# Patient Record
Sex: Male | Born: 1935 | Race: White | Hispanic: No | Marital: Married | Smoking: Never smoker
Health system: Southern US, Community
[De-identification: ages and names within clinical notes are randomized; demographics above are authoritative.]

## PROBLEM LIST (undated history)

## (undated) DIAGNOSIS — I4891 Unspecified atrial fibrillation: Secondary | ICD-10-CM

## (undated) DIAGNOSIS — I5022 Chronic systolic (congestive) heart failure: Secondary | ICD-10-CM

## (undated) DIAGNOSIS — K219 Gastro-esophageal reflux disease without esophagitis: Secondary | ICD-10-CM

## (undated) DIAGNOSIS — N400 Enlarged prostate without lower urinary tract symptoms: Secondary | ICD-10-CM

## (undated) DIAGNOSIS — I10 Essential (primary) hypertension: Secondary | ICD-10-CM

## (undated) HISTORY — PX: REPLACEMENT TOTAL KNEE BILATERAL: SUR1225

---

## 1998-12-09 ENCOUNTER — Encounter: Payer: Self-pay | Admitting: Neurosurgery

## 1998-12-09 ENCOUNTER — Ambulatory Visit (HOSPITAL_COMMUNITY): Admission: RE | Admit: 1998-12-09 | Discharge: 1998-12-09 | Payer: Self-pay | Admitting: Neurosurgery

## 1998-12-27 ENCOUNTER — Ambulatory Visit (HOSPITAL_COMMUNITY): Admission: RE | Admit: 1998-12-27 | Discharge: 1998-12-27 | Payer: Self-pay | Admitting: Neurosurgery

## 1998-12-27 ENCOUNTER — Encounter: Payer: Self-pay | Admitting: Neurosurgery

## 1999-01-10 ENCOUNTER — Ambulatory Visit (HOSPITAL_COMMUNITY): Admission: RE | Admit: 1999-01-10 | Discharge: 1999-01-10 | Payer: Self-pay | Admitting: Neurosurgery

## 1999-01-10 ENCOUNTER — Encounter: Payer: Self-pay | Admitting: Neurosurgery

## 1999-01-24 ENCOUNTER — Ambulatory Visit (HOSPITAL_COMMUNITY): Admission: RE | Admit: 1999-01-24 | Discharge: 1999-01-24 | Payer: Self-pay | Admitting: Neurosurgery

## 1999-01-24 ENCOUNTER — Encounter: Payer: Self-pay | Admitting: Neurosurgery

## 2005-04-25 ENCOUNTER — Ambulatory Visit: Payer: Self-pay | Admitting: Gastroenterology

## 2005-08-07 ENCOUNTER — Inpatient Hospital Stay: Payer: Self-pay | Admitting: Orthopaedic Surgery

## 2007-07-13 ENCOUNTER — Emergency Department: Payer: Self-pay | Admitting: Emergency Medicine

## 2007-07-13 ENCOUNTER — Other Ambulatory Visit: Payer: Self-pay

## 2007-07-14 ENCOUNTER — Ambulatory Visit: Payer: Self-pay | Admitting: Emergency Medicine

## 2009-07-11 ENCOUNTER — Ambulatory Visit: Payer: Self-pay | Admitting: Orthopedic Surgery

## 2009-11-22 ENCOUNTER — Ambulatory Visit: Payer: Self-pay | Admitting: Family Medicine

## 2010-02-15 ENCOUNTER — Ambulatory Visit: Payer: Self-pay | Admitting: Family Medicine

## 2011-02-20 ENCOUNTER — Ambulatory Visit: Payer: Self-pay | Admitting: Surgery

## 2011-03-20 ENCOUNTER — Ambulatory Visit: Payer: Self-pay | Admitting: Gastroenterology

## 2011-05-01 ENCOUNTER — Ambulatory Visit: Payer: Self-pay | Admitting: Gastroenterology

## 2011-05-02 LAB — PATHOLOGY REPORT

## 2012-01-01 ENCOUNTER — Ambulatory Visit: Payer: Self-pay | Admitting: Urology

## 2012-04-22 ENCOUNTER — Ambulatory Visit: Payer: Self-pay | Admitting: Vascular Surgery

## 2012-04-22 LAB — CREATININE, SERUM
Creatinine: 0.85 mg/dL (ref 0.60–1.30)
EGFR (African American): >60
EGFR (Non-African Amer.): >60

## 2012-04-22 LAB — BUN: BUN: 20 mg/dL — ABNORMAL HIGH (ref 7–18)

## 2012-04-22 LAB — PROTIME-INR
INR: 1.1
Prothrombin Time: 14.8 secs — ABNORMAL HIGH (ref 11.5–14.7)

## 2012-04-29 ENCOUNTER — Ambulatory Visit: Payer: Self-pay | Admitting: Vascular Surgery

## 2012-05-27 ENCOUNTER — Ambulatory Visit: Payer: Self-pay | Admitting: Vascular Surgery

## 2012-05-27 LAB — BASIC METABOLIC PANEL
Anion Gap: 7 (ref 7–16)
BUN: 15 mg/dL (ref 7–18)
Calcium, Total: 9.3 mg/dL (ref 8.5–10.1)
Co2: 30 mmol/L (ref 21–32)
Creatinine: 0.68 mg/dL (ref 0.60–1.30)
EGFR (African American): >60
EGFR (Non-African Amer.): >60
Glucose: 88 mg/dL (ref 65–99)

## 2012-05-27 LAB — CBC
HCT: 48 % (ref 40.0–52.0)
HGB: 16.5 g/dL (ref 13.0–18.0)
MCH: 35.5 pg — ABNORMAL HIGH (ref 26.0–34.0)
MCHC: 34.3 g/dL (ref 32.0–36.0)
MCV: 103 fL — ABNORMAL HIGH (ref 80–100)
Platelet: 152 10*3/uL (ref 150–440)
RBC: 4.65 10*6/uL (ref 4.40–5.90)
RDW: 14 % (ref 11.5–14.5)
WBC: 6.5 10*3/uL (ref 3.8–10.6)

## 2012-05-28 ENCOUNTER — Inpatient Hospital Stay: Payer: Self-pay | Admitting: Physician Assistant

## 2012-05-28 LAB — PROTIME-INR: INR: 1.1

## 2012-05-29 LAB — CBC WITH DIFFERENTIAL/PLATELET
Basophil %: 0.6 %
Eosinophil %: 1.7 %
HGB: 14 g/dL (ref 13.0–18.0)
MCH: 34.9 pg — ABNORMAL HIGH (ref 26.0–34.0)
MCV: 105 fL — ABNORMAL HIGH (ref 80–100)
Monocyte #: 1.3 x10 3/mm — ABNORMAL HIGH (ref 0.2–1.0)
Monocyte %: 12.7 %
Neutrophil #: 7.1 10*3/uL — ABNORMAL HIGH (ref 1.4–6.5)
Neutrophil %: 68.2 %
RBC: 4.01 10*6/uL — ABNORMAL LOW (ref 4.40–5.90)
WBC: 10.4 10*3/uL (ref 3.8–10.6)

## 2012-05-29 LAB — COMPREHENSIVE METABOLIC PANEL
Alkaline Phosphatase: 78 U/L (ref 50–136)
Anion Gap: 6 — ABNORMAL LOW (ref 7–16)
Bilirubin,Total: 1.2 mg/dL — ABNORMAL HIGH (ref 0.2–1.0)
Calcium, Total: 8.3 mg/dL — ABNORMAL LOW (ref 8.5–10.1)
Co2: 29 mmol/L (ref 21–32)
Creatinine: 0.84 mg/dL (ref 0.60–1.30)
EGFR (African American): >60
EGFR (Non-African Amer.): >60
Glucose: 99 mg/dL (ref 65–99)
Potassium: 3.8 mmol/L (ref 3.5–5.1)
SGOT(AST): 32 U/L (ref 15–37)
SGPT (ALT): 25 U/L (ref 12–78)
Sodium: 137 mmol/L (ref 136–145)
Total Protein: 7 g/dL (ref 6.4–8.2)

## 2012-05-29 LAB — PROTIME-INR
INR: 1.1
Prothrombin Time: 14.4 secs (ref 11.5–14.7)

## 2012-06-25 LAB — PATHOLOGY REPORT

## 2012-08-07 ENCOUNTER — Ambulatory Visit: Payer: Self-pay | Admitting: Physical Medicine and Rehabilitation

## 2013-03-10 DIAGNOSIS — N200 Calculus of kidney: Secondary | ICD-10-CM | POA: Insufficient documentation

## 2013-03-10 DIAGNOSIS — R350 Frequency of micturition: Secondary | ICD-10-CM | POA: Insufficient documentation

## 2013-03-10 DIAGNOSIS — N138 Other obstructive and reflux uropathy: Secondary | ICD-10-CM | POA: Insufficient documentation

## 2013-03-10 DIAGNOSIS — R3911 Hesitancy of micturition: Secondary | ICD-10-CM | POA: Insufficient documentation

## 2013-03-30 ENCOUNTER — Ambulatory Visit: Payer: Self-pay | Admitting: Orthopedic Surgery

## 2013-04-23 ENCOUNTER — Ambulatory Visit: Payer: Self-pay | Admitting: Orthopedic Surgery

## 2013-04-23 LAB — URINALYSIS, COMPLETE
Bacteria: NONE SEEN
Glucose,UR: NEGATIVE mg/dL (ref 0–75)
Ketone: NEGATIVE
Ph: 6 (ref 4.5–8.0)
Specific Gravity: 1.015 (ref 1.003–1.030)
Squamous Epithelial: NONE SEEN
WBC UR: <1 /HPF (ref 0–5)

## 2013-04-23 LAB — PROTIME-INR: INR: 1.7

## 2013-04-23 LAB — BASIC METABOLIC PANEL
BUN: 13 mg/dL (ref 7–18)
Calcium, Total: 9 mg/dL (ref 8.5–10.1)
Chloride: 103 mmol/L (ref 98–107)
EGFR (African American): >60
EGFR (Non-African Amer.): >60
Sodium: 137 mmol/L (ref 136–145)

## 2013-04-23 LAB — CBC
HCT: 44 % (ref 40.0–52.0)
HGB: 15.1 g/dL (ref 13.0–18.0)
MCH: 35.1 pg — ABNORMAL HIGH (ref 26.0–34.0)
MCV: 102 fL — ABNORMAL HIGH (ref 80–100)
RBC: 4.32 10*6/uL — ABNORMAL LOW (ref 4.40–5.90)
WBC: 5.9 10*3/uL (ref 3.8–10.6)

## 2013-04-23 LAB — MRSA PCR SCREENING

## 2013-06-04 ENCOUNTER — Ambulatory Visit: Payer: Self-pay | Admitting: Unknown Physician Specialty

## 2013-06-04 LAB — PROTIME-INR
INR: 2.2
Prothrombin Time: 24 secs — ABNORMAL HIGH (ref 11.5–14.7)

## 2013-06-11 ENCOUNTER — Inpatient Hospital Stay: Payer: Self-pay | Admitting: Orthopedic Surgery

## 2013-06-11 LAB — PROTIME-INR
INR: 1
Prothrombin Time: 13.8 secs (ref 11.5–14.7)

## 2013-06-12 LAB — BASIC METABOLIC PANEL
Anion Gap: 3 — ABNORMAL LOW (ref 7–16)
Calcium, Total: 7.9 mg/dL — ABNORMAL LOW (ref 8.5–10.1)
Creatinine: 0.91 mg/dL (ref 0.60–1.30)
EGFR (African American): >60
EGFR (Non-African Amer.): >60
Glucose: 92 mg/dL (ref 65–99)
Osmolality: 271 (ref 275–301)
Sodium: 136 mmol/L (ref 136–145)

## 2013-06-12 LAB — PLATELET COUNT: Platelet: 115 10*3/uL — ABNORMAL LOW (ref 150–440)

## 2013-06-13 LAB — PROTIME-INR: INR: 1.4

## 2013-06-14 LAB — PROTIME-INR: Prothrombin Time: 16.3 secs — ABNORMAL HIGH (ref 11.5–14.7)

## 2013-12-07 DIAGNOSIS — I482 Chronic atrial fibrillation, unspecified: Secondary | ICD-10-CM | POA: Insufficient documentation

## 2013-12-15 ENCOUNTER — Emergency Department: Payer: Self-pay | Admitting: Emergency Medicine

## 2014-03-20 ENCOUNTER — Inpatient Hospital Stay: Payer: Self-pay | Admitting: Family Medicine

## 2014-03-20 LAB — URINALYSIS, COMPLETE
BACTERIA: NONE SEEN
Bilirubin,UR: NEGATIVE
GLUCOSE, UR: NEGATIVE mg/dL (ref 0–75)
KETONE: NEGATIVE
Leukocyte Esterase: NEGATIVE
Nitrite: NEGATIVE
PH: 7 (ref 4.5–8.0)
Protein: NEGATIVE
RBC,UR: 92 /HPF (ref 0–5)
Specific Gravity: 1.013 (ref 1.003–1.030)
Squamous Epithelial: NONE SEEN
WBC UR: 1 /HPF (ref 0–5)

## 2014-03-20 LAB — COMPREHENSIVE METABOLIC PANEL
ALBUMIN: 3.7 g/dL (ref 3.4–5.0)
AST: 50 U/L — AB (ref 15–37)
Alkaline Phosphatase: 112 U/L
Anion Gap: 9 (ref 7–16)
BILIRUBIN TOTAL: 1.2 mg/dL — AB (ref 0.2–1.0)
BUN: 15 mg/dL (ref 7–18)
CHLORIDE: 100 mmol/L (ref 98–107)
CO2: 29 mmol/L (ref 21–32)
CREATININE: 0.81 mg/dL (ref 0.60–1.30)
Calcium, Total: 9 mg/dL (ref 8.5–10.1)
GLUCOSE: 107 mg/dL — AB (ref 65–99)
OSMOLALITY: 277 (ref 275–301)
POTASSIUM: 4.3 mmol/L (ref 3.5–5.1)
SGPT (ALT): 33 U/L
Sodium: 138 mmol/L (ref 136–145)
Total Protein: 8.6 g/dL — ABNORMAL HIGH (ref 6.4–8.2)

## 2014-03-20 LAB — CBC WITH DIFFERENTIAL/PLATELET
Basophil #: 0 10*3/uL (ref 0.0–0.1)
Basophil %: 0.3 %
EOS ABS: 0.1 10*3/uL (ref 0.0–0.7)
Eosinophil %: 0.9 %
HCT: 48.8 % (ref 40.0–52.0)
HGB: 16 g/dL (ref 13.0–18.0)
Lymphocyte #: 1.3 10*3/uL (ref 1.0–3.6)
Lymphocyte %: 12.7 %
MCH: 34.8 pg — AB (ref 26.0–34.0)
MCHC: 32.8 g/dL (ref 32.0–36.0)
MCV: 106 fL — ABNORMAL HIGH (ref 80–100)
Monocyte #: 0.8 x10 3/mm (ref 0.2–1.0)
Monocyte %: 7.7 %
NEUTROS ABS: 7.7 10*3/uL — AB (ref 1.4–6.5)
Neutrophil %: 78.4 %
Platelet: 117 10*3/uL — ABNORMAL LOW (ref 150–440)
RBC: 4.6 10*6/uL (ref 4.40–5.90)
RDW: 15.2 % — ABNORMAL HIGH (ref 11.5–14.5)
WBC: 9.8 10*3/uL (ref 3.8–10.6)

## 2014-03-20 LAB — PROTIME-INR
INR: 2.3
Prothrombin Time: 24.9 secs — ABNORMAL HIGH (ref 11.5–14.7)

## 2014-03-21 LAB — MAGNESIUM: Magnesium: 1.6 mg/dL — ABNORMAL LOW

## 2014-03-21 LAB — TSH: Thyroid Stimulating Horm: 1.48 u[IU]/mL

## 2014-03-21 LAB — CBC WITH DIFFERENTIAL/PLATELET
BASOS PCT: 0.6 %
Basophil #: 0.1 10*3/uL (ref 0.0–0.1)
EOS PCT: 3.8 %
Eosinophil #: 0.4 10*3/uL (ref 0.0–0.7)
HCT: 44.7 % (ref 40.0–52.0)
HGB: 15 g/dL (ref 13.0–18.0)
LYMPHS ABS: 1.3 10*3/uL (ref 1.0–3.6)
Lymphocyte %: 13 %
MCH: 35.3 pg — ABNORMAL HIGH (ref 26.0–34.0)
MCHC: 33.6 g/dL (ref 32.0–36.0)
MCV: 105 fL — AB (ref 80–100)
MONOS PCT: 7.6 %
Monocyte #: 0.8 x10 3/mm (ref 0.2–1.0)
Neutrophil #: 7.5 10*3/uL — ABNORMAL HIGH (ref 1.4–6.5)
Neutrophil %: 75 %
PLATELETS: 105 10*3/uL — AB (ref 150–440)
RBC: 4.26 10*6/uL — ABNORMAL LOW (ref 4.40–5.90)
RDW: 14.7 % — ABNORMAL HIGH (ref 11.5–14.5)
WBC: 10 10*3/uL (ref 3.8–10.6)

## 2014-03-21 LAB — BASIC METABOLIC PANEL
Anion Gap: 7 (ref 7–16)
BUN: 14 mg/dL (ref 7–18)
CHLORIDE: 102 mmol/L (ref 98–107)
Calcium, Total: 8.3 mg/dL — ABNORMAL LOW (ref 8.5–10.1)
Co2: 28 mmol/L (ref 21–32)
Creatinine: 0.81 mg/dL (ref 0.60–1.30)
EGFR (African American): >60
Glucose: 123 mg/dL — ABNORMAL HIGH (ref 65–99)
OSMOLALITY: 276 (ref 275–301)
Potassium: 4 mmol/L (ref 3.5–5.1)
SODIUM: 137 mmol/L (ref 136–145)

## 2014-03-21 LAB — LIPID PANEL
CHOLESTEROL: 88 mg/dL (ref 0–200)
HDL Cholesterol: 29 mg/dL — ABNORMAL LOW (ref 40–60)
Ldl Cholesterol, Calc: 45 mg/dL (ref 0–100)
Triglycerides: 72 mg/dL (ref 0–200)
VLDL CHOLESTEROL, CALC: 14 mg/dL (ref 5–40)

## 2014-03-21 LAB — PROTIME-INR
INR: 1.8
INR: 1.4
Prothrombin Time: 20.6 secs — ABNORMAL HIGH (ref 11.5–14.7)
Prothrombin Time: 16.8 secs — ABNORMAL HIGH (ref 11.5–14.7)

## 2014-03-21 LAB — HEMOGLOBIN A1C: HEMOGLOBIN A1C: 5.8 % (ref 4.2–6.3)

## 2014-03-22 LAB — BASIC METABOLIC PANEL
ANION GAP: 10 (ref 7–16)
BUN: 13 mg/dL (ref 7–18)
CO2: 26 mmol/L (ref 21–32)
Calcium, Total: 8 mg/dL — ABNORMAL LOW (ref 8.5–10.1)
Chloride: 96 mmol/L — ABNORMAL LOW (ref 98–107)
Creatinine: 0.91 mg/dL (ref 0.60–1.30)
EGFR (African American): >60
Glucose: 107 mg/dL — ABNORMAL HIGH (ref 65–99)
OSMOLALITY: 265 (ref 275–301)
Potassium: 3.8 mmol/L (ref 3.5–5.1)
Sodium: 132 mmol/L — ABNORMAL LOW (ref 136–145)

## 2014-03-22 LAB — CBC WITH DIFFERENTIAL/PLATELET
BASOS ABS: 0 10*3/uL (ref 0.0–0.1)
Basophil %: 0.3 %
Eosinophil #: 0.3 10*3/uL (ref 0.0–0.7)
Eosinophil %: 3.5 %
HCT: 41.6 % (ref 40.0–52.0)
HGB: 14.1 g/dL (ref 13.0–18.0)
Lymphocyte #: 1.2 10*3/uL (ref 1.0–3.6)
Lymphocyte %: 12.9 %
MCH: 35.2 pg — AB (ref 26.0–34.0)
MCHC: 33.8 g/dL (ref 32.0–36.0)
MCV: 104 fL — ABNORMAL HIGH (ref 80–100)
Monocyte #: 0.8 x10 3/mm (ref 0.2–1.0)
Monocyte %: 9 %
NEUTROS ABS: 6.7 10*3/uL — AB (ref 1.4–6.5)
NEUTROS PCT: 74.3 %
PLATELETS: 83 10*3/uL — AB (ref 150–440)
RBC: 3.99 10*6/uL — ABNORMAL LOW (ref 4.40–5.90)
RDW: 15.1 % — AB (ref 11.5–14.5)
WBC: 9 10*3/uL (ref 3.8–10.6)

## 2014-03-23 LAB — CBC WITH DIFFERENTIAL/PLATELET
Basophil #: 0.1 10*3/uL (ref 0.0–0.1)
Basophil %: 0.7 %
EOS ABS: 0.7 10*3/uL (ref 0.0–0.7)
Eosinophil %: 6.8 %
HCT: 42 % (ref 40.0–52.0)
HGB: 14.1 g/dL (ref 13.0–18.0)
LYMPHS ABS: 1.8 10*3/uL (ref 1.0–3.6)
LYMPHS PCT: 18.5 %
MCH: 35.5 pg — ABNORMAL HIGH (ref 26.0–34.0)
MCHC: 33.7 g/dL (ref 32.0–36.0)
MCV: 105 fL — AB (ref 80–100)
Monocyte #: 1.3 x10 3/mm — ABNORMAL HIGH (ref 0.2–1.0)
Monocyte %: 13.8 %
Neutrophil #: 5.9 10*3/uL (ref 1.4–6.5)
Neutrophil %: 60.2 %
Platelet: 86 10*3/uL — ABNORMAL LOW (ref 150–440)
RBC: 3.98 10*6/uL — ABNORMAL LOW (ref 4.40–5.90)
RDW: 15.2 % — ABNORMAL HIGH (ref 11.5–14.5)
WBC: 9.8 10*3/uL (ref 3.8–10.6)

## 2014-03-23 LAB — BASIC METABOLIC PANEL
Anion Gap: 10 (ref 7–16)
BUN: 16 mg/dL (ref 7–18)
CALCIUM: 8.1 mg/dL — AB (ref 8.5–10.1)
CHLORIDE: 95 mmol/L — AB (ref 98–107)
CO2: 29 mmol/L (ref 21–32)
Creatinine: 0.99 mg/dL (ref 0.60–1.30)
EGFR (Non-African Amer.): >60
GLUCOSE: 88 mg/dL (ref 65–99)
Osmolality: 269 (ref 275–301)
POTASSIUM: 3.5 mmol/L (ref 3.5–5.1)
Sodium: 134 mmol/L — ABNORMAL LOW (ref 136–145)

## 2014-03-23 LAB — PROTIME-INR
INR: 1.4
PROTHROMBIN TIME: 16.5 s — AB (ref 11.5–14.7)

## 2014-03-24 ENCOUNTER — Encounter: Payer: Self-pay | Admitting: Internal Medicine

## 2014-03-24 LAB — BASIC METABOLIC PANEL
ANION GAP: 7 (ref 7–16)
BUN: 18 mg/dL (ref 7–18)
CALCIUM: 8.2 mg/dL — AB (ref 8.5–10.1)
CHLORIDE: 96 mmol/L — AB (ref 98–107)
Co2: 30 mmol/L (ref 21–32)
Creatinine: 0.92 mg/dL (ref 0.60–1.30)
GLUCOSE: 99 mg/dL (ref 65–99)
Osmolality: 268 (ref 275–301)
POTASSIUM: 3.5 mmol/L (ref 3.5–5.1)
SODIUM: 133 mmol/L — AB (ref 136–145)

## 2014-03-24 LAB — CBC WITH DIFFERENTIAL/PLATELET
BASOS PCT: 0.4 %
Basophil #: 0 10*3/uL (ref 0.0–0.1)
Eosinophil #: 0.6 10*3/uL (ref 0.0–0.7)
Eosinophil %: 6.3 %
HCT: 41.4 % (ref 40.0–52.0)
HGB: 13.7 g/dL (ref 13.0–18.0)
LYMPHS ABS: 1.4 10*3/uL (ref 1.0–3.6)
Lymphocyte %: 15.4 %
MCH: 35 pg — AB (ref 26.0–34.0)
MCHC: 33.1 g/dL (ref 32.0–36.0)
MCV: 106 fL — ABNORMAL HIGH (ref 80–100)
Monocyte #: 1.3 x10 3/mm — ABNORMAL HIGH (ref 0.2–1.0)
Monocyte %: 14.1 %
Neutrophil #: 5.8 10*3/uL (ref 1.4–6.5)
Neutrophil %: 63.8 %
Platelet: 105 10*3/uL — ABNORMAL LOW (ref 150–440)
RBC: 3.91 10*6/uL — ABNORMAL LOW (ref 4.40–5.90)
RDW: 15.1 % — AB (ref 11.5–14.5)
WBC: 9 10*3/uL (ref 3.8–10.6)

## 2014-03-24 LAB — PROTIME-INR
INR: 1.5
PROTHROMBIN TIME: 18 s — AB (ref 11.5–14.7)

## 2014-03-25 ENCOUNTER — Ambulatory Visit: Payer: Self-pay | Admitting: Gerontology

## 2014-03-25 LAB — PROTIME-INR
INR: 1.5
PROTHROMBIN TIME: 17.9 s — AB (ref 11.5–14.7)

## 2014-03-27 LAB — URINALYSIS, COMPLETE
BACTERIA: NONE SEEN
Bilirubin,UR: NEGATIVE
Glucose,UR: NEGATIVE mg/dL (ref 0–75)
KETONE: NEGATIVE
Leukocyte Esterase: NEGATIVE
Nitrite: NEGATIVE
Ph: 6 (ref 4.5–8.0)
Protein: NEGATIVE
SPECIFIC GRAVITY: 1.018 (ref 1.003–1.030)
Squamous Epithelial: <1
WBC UR: 3 /HPF (ref 0–5)

## 2014-03-27 LAB — PROTIME-INR
INR: 1.8
PROTHROMBIN TIME: 20.1 s — AB (ref 11.5–14.7)

## 2014-03-30 LAB — PROTIME-INR
INR: 2
PROTHROMBIN TIME: 22.3 s — AB (ref 11.5–14.7)

## 2014-03-30 LAB — URINE CULTURE

## 2014-04-02 LAB — PROTIME-INR
INR: 3.4
Prothrombin Time: 33.5 secs — ABNORMAL HIGH (ref 11.5–14.7)

## 2014-04-04 LAB — PROTIME-INR
INR: 2.7
Prothrombin Time: 28.3 secs — ABNORMAL HIGH (ref 11.5–14.7)

## 2014-04-06 LAB — PROTIME-INR
INR: 1.9
PROTHROMBIN TIME: 21.5 s — AB (ref 11.5–14.7)

## 2014-04-13 ENCOUNTER — Encounter: Payer: Self-pay | Admitting: Internal Medicine

## 2014-04-13 LAB — PROTIME-INR
INR: 2.8
Prothrombin Time: 28.4 secs — ABNORMAL HIGH (ref 11.5–14.7)

## 2014-04-20 LAB — PROTIME-INR
INR: 2.5
Prothrombin Time: 26.5 secs — ABNORMAL HIGH (ref 11.5–14.7)

## 2014-04-27 LAB — PROTIME-INR
INR: 2.4
PROTHROMBIN TIME: 25.6 s — AB (ref 11.5–14.7)

## 2014-05-04 LAB — PROTIME-INR
INR: 2.4
PROTHROMBIN TIME: 25.8 s — AB (ref 11.5–14.7)

## 2014-05-11 LAB — PROTIME-INR
INR: 2.1
PROTHROMBIN TIME: 22.7 s — AB (ref 11.5–14.7)

## 2014-05-13 ENCOUNTER — Encounter: Payer: Self-pay | Admitting: Internal Medicine

## 2014-05-18 LAB — PROTIME-INR
INR: 1.3
PROTHROMBIN TIME: 16.3 s — AB (ref 11.5–14.7)

## 2014-05-20 LAB — PROTIME-INR
INR: 1.6
PROTHROMBIN TIME: 18.7 s — AB (ref 11.5–14.7)

## 2014-06-13 ENCOUNTER — Encounter: Payer: Self-pay | Admitting: Internal Medicine

## 2014-06-26 ENCOUNTER — Inpatient Hospital Stay: Payer: Self-pay | Admitting: Family Medicine

## 2014-06-26 LAB — MAGNESIUM: Magnesium: 1.3 mg/dL — ABNORMAL LOW

## 2014-06-26 LAB — CBC WITH DIFFERENTIAL/PLATELET
Basophil #: 0 10*3/uL (ref 0.0–0.1)
Basophil %: 0.4 %
Eosinophil #: 0.1 10*3/uL (ref 0.0–0.7)
Eosinophil %: 0.5 %
HCT: 43.6 % (ref 40.0–52.0)
HGB: 14.8 g/dL (ref 13.0–18.0)
Lymphocyte #: 1 10*3/uL (ref 1.0–3.6)
Lymphocyte %: 8 %
MCH: 34.9 pg — ABNORMAL HIGH (ref 26.0–34.0)
MCHC: 33.9 g/dL (ref 32.0–36.0)
MCV: 103 fL — ABNORMAL HIGH (ref 80–100)
MONO ABS: 0.4 x10 3/mm (ref 0.2–1.0)
Monocyte %: 3.3 %
NEUTROS ABS: 10.8 10*3/uL — AB (ref 1.4–6.5)
NEUTROS PCT: 87.8 %
Platelet: 127 10*3/uL — ABNORMAL LOW (ref 150–440)
RBC: 4.24 10*6/uL — AB (ref 4.40–5.90)
RDW: 14.2 % (ref 11.5–14.5)
WBC: 12.3 10*3/uL — AB (ref 3.8–10.6)

## 2014-06-26 LAB — URINALYSIS, COMPLETE
Bacteria: NONE SEEN
Bilirubin,UR: NEGATIVE
Glucose,UR: NEGATIVE mg/dL (ref 0–75)
Ketone: NEGATIVE
Leukocyte Esterase: NEGATIVE
Nitrite: NEGATIVE
PH: 9 (ref 4.5–8.0)
Protein: NEGATIVE
RBC,UR: 794 /HPF (ref 0–5)
Specific Gravity: 1.011 (ref 1.003–1.030)
Squamous Epithelial: NONE SEEN

## 2014-06-26 LAB — COMPREHENSIVE METABOLIC PANEL
ALK PHOS: 100 U/L
ALT: 24 U/L
Albumin: 3.3 g/dL — ABNORMAL LOW (ref 3.4–5.0)
Anion Gap: 9 (ref 7–16)
BUN: 11 mg/dL (ref 7–18)
Bilirubin,Total: 1 mg/dL (ref 0.2–1.0)
CO2: 26 mmol/L (ref 21–32)
Calcium, Total: 8.2 mg/dL — ABNORMAL LOW (ref 8.5–10.1)
Chloride: 103 mmol/L (ref 98–107)
Creatinine: 0.87 mg/dL (ref 0.60–1.30)
Glucose: 107 mg/dL — ABNORMAL HIGH (ref 65–99)
OSMOLALITY: 276 (ref 275–301)
Potassium: 4 mmol/L (ref 3.5–5.1)
SGOT(AST): 28 U/L (ref 15–37)
Sodium: 138 mmol/L (ref 136–145)
TOTAL PROTEIN: 7.7 g/dL (ref 6.4–8.2)

## 2014-06-26 LAB — PROTIME-INR
INR: 1.3
PROTHROMBIN TIME: 16.4 s — AB (ref 11.5–14.7)

## 2014-06-26 LAB — TROPONIN I: Troponin-I: 0.05 ng/mL

## 2014-06-26 LAB — RAPID INFLUENZA A&B ANTIGENS

## 2014-06-26 LAB — PHOSPHORUS: PHOSPHORUS: 1.7 mg/dL — AB (ref 2.5–4.9)

## 2014-06-27 LAB — CBC WITH DIFFERENTIAL/PLATELET
BASOS ABS: 0.1 10*3/uL (ref 0.0–0.1)
BASOS PCT: 0.3 %
EOS PCT: 0 %
Eosinophil #: 0 10*3/uL (ref 0.0–0.7)
HCT: 43.1 % (ref 40.0–52.0)
HGB: 13.5 g/dL (ref 13.0–18.0)
LYMPHS PCT: 6.3 %
Lymphocyte #: 1.7 10*3/uL (ref 1.0–3.6)
MCH: 33.9 pg (ref 26.0–34.0)
MCHC: 31.4 g/dL — ABNORMAL LOW (ref 32.0–36.0)
MCV: 108 fL — ABNORMAL HIGH (ref 80–100)
MONO ABS: 1.8 x10 3/mm — AB (ref 0.2–1.0)
Monocyte %: 6.6 %
NEUTROS PCT: 86.8 %
Neutrophil #: 23.2 10*3/uL — ABNORMAL HIGH (ref 1.4–6.5)
Platelet: 98 10*3/uL — ABNORMAL LOW (ref 150–440)
RBC: 4 10*6/uL — AB (ref 4.40–5.90)
RDW: 14.7 % — AB (ref 11.5–14.5)
WBC: 26.7 10*3/uL — AB (ref 3.8–10.6)

## 2014-06-27 LAB — COMPREHENSIVE METABOLIC PANEL
ALT: 27 U/L
Albumin: 2.5 g/dL — ABNORMAL LOW (ref 3.4–5.0)
Alkaline Phosphatase: 84 U/L
Anion Gap: 14 (ref 7–16)
BUN: 14 mg/dL (ref 7–18)
Bilirubin,Total: 1.5 mg/dL — ABNORMAL HIGH (ref 0.2–1.0)
CALCIUM: 7.5 mg/dL — AB (ref 8.5–10.1)
Chloride: 108 mmol/L — ABNORMAL HIGH (ref 98–107)
Co2: 20 mmol/L — ABNORMAL LOW (ref 21–32)
Creatinine: 1.59 mg/dL — ABNORMAL HIGH (ref 0.60–1.30)
GFR CALC AF AMER: 55 — AB
GFR CALC NON AF AMER: 45 — AB
Glucose: 96 mg/dL (ref 65–99)
Osmolality: 283 (ref 275–301)
Potassium: 3.5 mmol/L (ref 3.5–5.1)
SGOT(AST): 41 U/L — ABNORMAL HIGH (ref 15–37)
Sodium: 142 mmol/L (ref 136–145)
Total Protein: 6.1 g/dL — ABNORMAL LOW (ref 6.4–8.2)

## 2014-06-27 LAB — PHOSPHORUS: PHOSPHORUS: 2.5 mg/dL (ref 2.5–4.9)

## 2014-06-27 LAB — MAGNESIUM
MAGNESIUM: 2.1 mg/dL
Magnesium: 1.7 mg/dL — ABNORMAL LOW

## 2014-06-28 LAB — URINE CULTURE

## 2014-06-28 LAB — VANCOMYCIN, TROUGH: Vancomycin, Trough: 23 ug/mL (ref 10–20)

## 2014-06-29 LAB — CBC WITH DIFFERENTIAL/PLATELET
Basophil #: 0.1 10*3/uL (ref 0.0–0.1)
Basophil %: 0.3 %
Eosinophil #: 0.2 10*3/uL (ref 0.0–0.7)
Eosinophil %: 0.8 %
HCT: 38.3 % — ABNORMAL LOW (ref 40.0–52.0)
HGB: 12.7 g/dL — AB (ref 13.0–18.0)
Lymphocyte #: 1.7 10*3/uL (ref 1.0–3.6)
Lymphocyte %: 9.2 %
MCH: 34.5 pg — AB (ref 26.0–34.0)
MCHC: 33.1 g/dL (ref 32.0–36.0)
MCV: 104 fL — AB (ref 80–100)
Monocyte #: 1.1 x10 3/mm — ABNORMAL HIGH (ref 0.2–1.0)
Monocyte %: 6.2 %
Neutrophil #: 15.3 10*3/uL — ABNORMAL HIGH (ref 1.4–6.5)
Neutrophil %: 83.5 %
Platelet: 99 10*3/uL — ABNORMAL LOW (ref 150–440)
RBC: 3.68 10*6/uL — ABNORMAL LOW (ref 4.40–5.90)
RDW: 14.6 % — AB (ref 11.5–14.5)
WBC: 18.4 10*3/uL — AB (ref 3.8–10.6)

## 2014-06-29 LAB — BASIC METABOLIC PANEL
Anion Gap: 7 (ref 7–16)
BUN: 18 mg/dL (ref 7–18)
CHLORIDE: 106 mmol/L (ref 98–107)
CO2: 26 mmol/L (ref 21–32)
Calcium, Total: 7.4 mg/dL — ABNORMAL LOW (ref 8.5–10.1)
Creatinine: 1.32 mg/dL — ABNORMAL HIGH (ref 0.60–1.30)
GFR CALC NON AF AMER: 56 — AB
Glucose: 116 mg/dL — ABNORMAL HIGH (ref 65–99)
Osmolality: 280 (ref 275–301)
Potassium: 3.4 mmol/L — ABNORMAL LOW (ref 3.5–5.1)
SODIUM: 139 mmol/L (ref 136–145)

## 2014-06-30 LAB — CBC WITH DIFFERENTIAL/PLATELET
BASOS ABS: 0.1 10*3/uL (ref 0.0–0.1)
Basophil %: 0.5 %
EOS ABS: 0.2 10*3/uL (ref 0.0–0.7)
Eosinophil %: 1.8 %
HCT: 37.1 % — ABNORMAL LOW (ref 40.0–52.0)
HGB: 12.2 g/dL — AB (ref 13.0–18.0)
Lymphocyte #: 1.6 10*3/uL (ref 1.0–3.6)
Lymphocyte %: 14 %
MCH: 34.4 pg — AB (ref 26.0–34.0)
MCHC: 33 g/dL (ref 32.0–36.0)
MCV: 104 fL — ABNORMAL HIGH (ref 80–100)
MONOS PCT: 8.8 %
Monocyte #: 1 x10 3/mm (ref 0.2–1.0)
NEUTROS ABS: 8.8 10*3/uL — AB (ref 1.4–6.5)
Neutrophil %: 74.9 %
Platelet: 94 10*3/uL — ABNORMAL LOW (ref 150–440)
RBC: 3.55 10*6/uL — ABNORMAL LOW (ref 4.40–5.90)
RDW: 14.5 % (ref 11.5–14.5)
WBC: 11.7 10*3/uL — ABNORMAL HIGH (ref 3.8–10.6)

## 2014-06-30 LAB — BASIC METABOLIC PANEL
Anion Gap: 5 — ABNORMAL LOW (ref 7–16)
BUN: 16 mg/dL (ref 7–18)
CREATININE: 1.15 mg/dL (ref 0.60–1.30)
Calcium, Total: 8 mg/dL — ABNORMAL LOW (ref 8.5–10.1)
Chloride: 107 mmol/L (ref 98–107)
Co2: 26 mmol/L (ref 21–32)
Glucose: 88 mg/dL (ref 65–99)
Osmolality: 276 (ref 275–301)
POTASSIUM: 3.7 mmol/L (ref 3.5–5.1)
SODIUM: 138 mmol/L (ref 136–145)

## 2014-07-01 LAB — CBC WITH DIFFERENTIAL/PLATELET
BASOS ABS: 0.1 10*3/uL (ref 0.0–0.1)
Basophil %: 0.5 %
Eosinophil #: 0.2 10*3/uL (ref 0.0–0.7)
Eosinophil %: 1.9 %
HCT: 37.8 % — AB (ref 40.0–52.0)
HGB: 12.5 g/dL — AB (ref 13.0–18.0)
Lymphocyte #: 1.8 10*3/uL (ref 1.0–3.6)
Lymphocyte %: 15.9 %
MCH: 34.3 pg — AB (ref 26.0–34.0)
MCHC: 33.1 g/dL (ref 32.0–36.0)
MCV: 104 fL — ABNORMAL HIGH (ref 80–100)
Monocyte #: 1.4 x10 3/mm — ABNORMAL HIGH (ref 0.2–1.0)
Monocyte %: 11.9 %
NEUTROS PCT: 69.8 %
Neutrophil #: 8 10*3/uL — ABNORMAL HIGH (ref 1.4–6.5)
PLATELETS: 111 10*3/uL — AB (ref 150–440)
RBC: 3.65 10*6/uL — AB (ref 4.40–5.90)
RDW: 14.3 % (ref 11.5–14.5)
WBC: 11.4 10*3/uL — AB (ref 3.8–10.6)

## 2014-07-01 LAB — COMPREHENSIVE METABOLIC PANEL
ALBUMIN: 2.3 g/dL — AB (ref 3.4–5.0)
ALK PHOS: 133 U/L — AB
ALT: 35 U/L
Anion Gap: 5 — ABNORMAL LOW (ref 7–16)
BILIRUBIN TOTAL: 1.2 mg/dL — AB (ref 0.2–1.0)
BUN: 16 mg/dL (ref 7–18)
CALCIUM: 7.6 mg/dL — AB (ref 8.5–10.1)
CREATININE: 1.06 mg/dL (ref 0.60–1.30)
Chloride: 111 mmol/L — ABNORMAL HIGH (ref 98–107)
Co2: 27 mmol/L (ref 21–32)
GLUCOSE: 85 mg/dL (ref 65–99)
Osmolality: 285 (ref 275–301)
Potassium: 3.6 mmol/L (ref 3.5–5.1)
SGOT(AST): 35 U/L (ref 15–37)
Sodium: 143 mmol/L (ref 136–145)
Total Protein: 6.2 g/dL — ABNORMAL LOW (ref 6.4–8.2)

## 2014-07-01 LAB — CULTURE, BLOOD (SINGLE)

## 2014-07-02 ENCOUNTER — Emergency Department: Payer: Self-pay | Admitting: Emergency Medicine

## 2014-07-02 LAB — CBC
HCT: 39.6 % — ABNORMAL LOW (ref 40.0–52.0)
HGB: 13.1 g/dL (ref 13.0–18.0)
MCH: 35 pg — ABNORMAL HIGH (ref 26.0–34.0)
MCHC: 33.2 g/dL (ref 32.0–36.0)
MCV: 105 fL — ABNORMAL HIGH (ref 80–100)
PLATELETS: 145 10*3/uL — AB (ref 150–440)
RBC: 3.76 10*6/uL — ABNORMAL LOW (ref 4.40–5.90)
RDW: 14.3 % (ref 11.5–14.5)
WBC: 9.6 10*3/uL (ref 3.8–10.6)

## 2014-07-02 LAB — COMPREHENSIVE METABOLIC PANEL
ALBUMIN: 2.4 g/dL — AB (ref 3.4–5.0)
ALT: 31 U/L
ANION GAP: 4 — AB (ref 7–16)
Alkaline Phosphatase: 149 U/L — ABNORMAL HIGH
BILIRUBIN TOTAL: 0.8 mg/dL (ref 0.2–1.0)
BUN: 18 mg/dL (ref 7–18)
CO2: 28 mmol/L (ref 21–32)
CREATININE: 1.04 mg/dL (ref 0.60–1.30)
Calcium, Total: 8.5 mg/dL (ref 8.5–10.1)
Chloride: 112 mmol/L — ABNORMAL HIGH (ref 98–107)
Glucose: 93 mg/dL (ref 65–99)
Potassium: 4.2 mmol/L (ref 3.5–5.1)
SGOT(AST): 39 U/L — ABNORMAL HIGH (ref 15–37)
SODIUM: 144 mmol/L (ref 136–145)
TOTAL PROTEIN: 6.5 g/dL (ref 6.4–8.2)

## 2014-07-02 LAB — PROTIME-INR
INR: 2.2
PROTHROMBIN TIME: 23.5 s — AB (ref 11.5–14.7)

## 2014-07-02 LAB — PRO B NATRIURETIC PEPTIDE: B-Type Natriuretic Peptide: 3218 pg/mL — ABNORMAL HIGH (ref 0–450)

## 2014-07-02 LAB — TROPONIN I: TROPONIN-I: 0.03 ng/mL

## 2014-07-07 DIAGNOSIS — I5023 Acute on chronic systolic (congestive) heart failure: Secondary | ICD-10-CM | POA: Insufficient documentation

## 2014-07-13 ENCOUNTER — Encounter: Payer: Self-pay | Admitting: Internal Medicine

## 2014-07-27 LAB — COMPREHENSIVE METABOLIC PANEL
ALK PHOS: 113 U/L
ANION GAP: 8 (ref 7–16)
AST: 31 U/L (ref 15–37)
Albumin: 3.1 g/dL — ABNORMAL LOW (ref 3.4–5.0)
BILIRUBIN TOTAL: 1.1 mg/dL — AB (ref 0.2–1.0)
BUN: 13 mg/dL (ref 7–18)
CALCIUM: 8.6 mg/dL (ref 8.5–10.1)
CHLORIDE: 105 mmol/L (ref 98–107)
CO2: 25 mmol/L (ref 21–32)
Creatinine: 1.05 mg/dL (ref 0.60–1.30)
EGFR (African American): >60
EGFR (Non-African Amer.): >60
Glucose: 104 mg/dL — ABNORMAL HIGH (ref 65–99)
Osmolality: 276 (ref 275–301)
POTASSIUM: 3.1 mmol/L — AB (ref 3.5–5.1)
SGPT (ALT): 16 U/L
Sodium: 138 mmol/L (ref 136–145)
TOTAL PROTEIN: 8.4 g/dL — AB (ref 6.4–8.2)

## 2014-07-27 LAB — URINALYSIS, COMPLETE
BILIRUBIN, UR: NEGATIVE
GLUCOSE, UR: NEGATIVE mg/dL (ref 0–75)
Ketone: NEGATIVE
Nitrite: NEGATIVE
PH: 5 (ref 4.5–8.0)
Protein: NEGATIVE
RBC,UR: 10 /HPF (ref 0–5)
Specific Gravity: 1.009 (ref 1.003–1.030)
Squamous Epithelial: 1
WBC UR: 62 /HPF (ref 0–5)

## 2014-07-27 LAB — CBC WITH DIFFERENTIAL/PLATELET
BASOS ABS: 0.1 10*3/uL (ref 0.0–0.1)
BASOS PCT: 0.5 %
Eosinophil #: 0.1 10*3/uL (ref 0.0–0.7)
Eosinophil %: 1 %
HCT: 43.8 % (ref 40.0–52.0)
HGB: 14.3 g/dL (ref 13.0–18.0)
LYMPHS PCT: 5.7 %
Lymphocyte #: 0.8 10*3/uL — ABNORMAL LOW (ref 1.0–3.6)
MCH: 33.5 pg (ref 26.0–34.0)
MCHC: 32.7 g/dL (ref 32.0–36.0)
MCV: 103 fL — AB (ref 80–100)
MONO ABS: 0.1 x10 3/mm — AB (ref 0.2–1.0)
Monocyte %: 1 %
NEUTROS ABS: 12.3 10*3/uL — AB (ref 1.4–6.5)
NEUTROS PCT: 91.8 %
Platelet: 153 10*3/uL (ref 150–440)
RBC: 4.27 10*6/uL — ABNORMAL LOW (ref 4.40–5.90)
RDW: 14.2 % (ref 11.5–14.5)
WBC: 13.4 10*3/uL — AB (ref 3.8–10.6)

## 2014-07-27 LAB — LIPASE, BLOOD: LIPASE: 89 U/L (ref 73–393)

## 2014-07-28 ENCOUNTER — Inpatient Hospital Stay: Payer: Self-pay | Admitting: Family Medicine

## 2014-07-28 LAB — CLOSTRIDIUM DIFFICILE(ARMC)

## 2014-07-29 LAB — CBC WITH DIFFERENTIAL/PLATELET
BASOS ABS: 0.1 10*3/uL (ref 0.0–0.1)
Basophil %: 0.8 %
EOS ABS: 0.2 10*3/uL (ref 0.0–0.7)
Eosinophil %: 1.8 %
HCT: 36.8 % — ABNORMAL LOW (ref 40.0–52.0)
HGB: 12.1 g/dL — ABNORMAL LOW (ref 13.0–18.0)
Lymphocyte #: 2 10*3/uL (ref 1.0–3.6)
Lymphocyte %: 17.1 %
MCH: 33.6 pg (ref 26.0–34.0)
MCHC: 32.9 g/dL (ref 32.0–36.0)
MCV: 102 fL — ABNORMAL HIGH (ref 80–100)
MONO ABS: 1.5 x10 3/mm — AB (ref 0.2–1.0)
Monocyte %: 13.3 %
Neutrophil #: 7.8 10*3/uL — ABNORMAL HIGH (ref 1.4–6.5)
Neutrophil %: 67 %
Platelet: 131 10*3/uL — ABNORMAL LOW (ref 150–440)
RBC: 3.6 10*6/uL — AB (ref 4.40–5.90)
RDW: 14.3 % (ref 11.5–14.5)
WBC: 11.6 10*3/uL — ABNORMAL HIGH (ref 3.8–10.6)

## 2014-07-29 LAB — BASIC METABOLIC PANEL
Anion Gap: 1 — ABNORMAL LOW (ref 7–16)
BUN: 13 mg/dL (ref 7–18)
CHLORIDE: 107 mmol/L (ref 98–107)
CREATININE: 0.9 mg/dL (ref 0.60–1.30)
Calcium, Total: 8 mg/dL — ABNORMAL LOW (ref 8.5–10.1)
Co2: 26 mmol/L (ref 21–32)
EGFR (African American): >60
EGFR (Non-African Amer.): >60
Glucose: 124 mg/dL — ABNORMAL HIGH (ref 65–99)
Osmolality: 270 (ref 275–301)
Potassium: 3.1 mmol/L — ABNORMAL LOW (ref 3.5–5.1)
SODIUM: 134 mmol/L — AB (ref 136–145)

## 2014-07-30 LAB — CBC WITH DIFFERENTIAL/PLATELET
BASOS ABS: 0.1 10*3/uL (ref 0.0–0.1)
BASOS PCT: 0.8 %
Eosinophil #: 0.2 10*3/uL (ref 0.0–0.7)
Eosinophil %: 2.3 %
HCT: 36.6 % — ABNORMAL LOW (ref 40.0–52.0)
HGB: 11.9 g/dL — ABNORMAL LOW (ref 13.0–18.0)
LYMPHS PCT: 17.2 %
Lymphocyte #: 1.7 10*3/uL (ref 1.0–3.6)
MCH: 33.7 pg (ref 26.0–34.0)
MCHC: 32.6 g/dL (ref 32.0–36.0)
MCV: 104 fL — AB (ref 80–100)
Monocyte #: 1.1 x10 3/mm — ABNORMAL HIGH (ref 0.2–1.0)
Monocyte %: 10.9 %
NEUTROS PCT: 68.8 %
Neutrophil #: 6.8 10*3/uL — ABNORMAL HIGH (ref 1.4–6.5)
Platelet: 138 10*3/uL — ABNORMAL LOW (ref 150–440)
RBC: 3.54 10*6/uL — ABNORMAL LOW (ref 4.40–5.90)
RDW: 14.6 % — ABNORMAL HIGH (ref 11.5–14.5)
WBC: 9.9 10*3/uL (ref 3.8–10.6)

## 2014-07-30 LAB — BASIC METABOLIC PANEL
ANION GAP: 9 (ref 7–16)
BUN: 13 mg/dL (ref 7–18)
CHLORIDE: 106 mmol/L (ref 98–107)
CREATININE: 0.95 mg/dL (ref 0.60–1.30)
Calcium, Total: 8.2 mg/dL — ABNORMAL LOW (ref 8.5–10.1)
Co2: 25 mmol/L (ref 21–32)
EGFR (African American): >60
Glucose: 94 mg/dL (ref 65–99)
Osmolality: 279 (ref 275–301)
Potassium: 3.6 mmol/L (ref 3.5–5.1)
Sodium: 140 mmol/L (ref 136–145)

## 2014-07-30 LAB — CK TOTAL AND CKMB (NOT AT ARMC)
CK, Total: 32 U/L — ABNORMAL LOW (ref 39–308)
CK-MB: 0.8 ng/mL (ref 0.5–3.6)

## 2014-07-30 LAB — TROPONIN I: TROPONIN-I: 0.02 ng/mL

## 2014-07-31 LAB — CBC WITH DIFFERENTIAL/PLATELET
Basophil #: 0.1 10*3/uL (ref 0.0–0.1)
Basophil %: 1.1 %
Eosinophil #: 0.4 10*3/uL (ref 0.0–0.7)
Eosinophil %: 4.5 %
HCT: 37.2 % — ABNORMAL LOW (ref 40.0–52.0)
HGB: 12.4 g/dL — ABNORMAL LOW (ref 13.0–18.0)
Lymphocyte #: 2.3 10*3/uL (ref 1.0–3.6)
Lymphocyte %: 24.7 %
MCH: 33.9 pg (ref 26.0–34.0)
MCHC: 33.3 g/dL (ref 32.0–36.0)
MCV: 102 fL — ABNORMAL HIGH (ref 80–100)
Monocyte #: 0.9 x10 3/mm (ref 0.2–1.0)
Monocyte %: 10.3 %
Neutrophil #: 5.5 10*3/uL (ref 1.4–6.5)
Neutrophil %: 59.4 %
PLATELETS: 153 10*3/uL (ref 150–440)
RBC: 3.65 10*6/uL — ABNORMAL LOW (ref 4.40–5.90)
RDW: 14.6 % — ABNORMAL HIGH (ref 11.5–14.5)
WBC: 9.2 10*3/uL (ref 3.8–10.6)

## 2014-07-31 LAB — COMPREHENSIVE METABOLIC PANEL
ALBUMIN: 2.4 g/dL — AB (ref 3.4–5.0)
AST: 23 U/L (ref 15–37)
Alkaline Phosphatase: 85 U/L
Anion Gap: 8 (ref 7–16)
BUN: 12 mg/dL (ref 7–18)
Bilirubin,Total: 0.6 mg/dL (ref 0.2–1.0)
CALCIUM: 8.5 mg/dL (ref 8.5–10.1)
Chloride: 106 mmol/L (ref 98–107)
Co2: 26 mmol/L (ref 21–32)
Creatinine: 0.95 mg/dL (ref 0.60–1.30)
EGFR (African American): >60
EGFR (Non-African Amer.): >60
Glucose: 89 mg/dL (ref 65–99)
Osmolality: 279 (ref 275–301)
Potassium: 3.6 mmol/L (ref 3.5–5.1)
SGPT (ALT): 14 U/L
Sodium: 140 mmol/L (ref 136–145)
TOTAL PROTEIN: 6.7 g/dL (ref 6.4–8.2)

## 2014-07-31 LAB — URINE CULTURE

## 2014-07-31 LAB — CK TOTAL AND CKMB (NOT AT ARMC)
CK, Total: 25 U/L — ABNORMAL LOW (ref 39–308)
CK-MB: 0.7 ng/mL (ref 0.5–3.6)

## 2014-07-31 LAB — TROPONIN I: Troponin-I: <0.02 ng/mL

## 2014-08-02 LAB — BASIC METABOLIC PANEL
Anion Gap: 7 (ref 7–16)
BUN: 10 mg/dL (ref 7–18)
CO2: 28 mmol/L (ref 21–32)
Calcium, Total: 8.3 mg/dL — ABNORMAL LOW (ref 8.5–10.1)
Chloride: 103 mmol/L (ref 98–107)
Creatinine: 0.9 mg/dL (ref 0.60–1.30)
Glucose: 89 mg/dL (ref 65–99)
OSMOLALITY: 274 (ref 275–301)
Potassium: 3.6 mmol/L (ref 3.5–5.1)
Sodium: 138 mmol/L (ref 136–145)

## 2014-08-02 LAB — STOOL CULTURE

## 2014-08-13 ENCOUNTER — Encounter: Payer: Self-pay | Admitting: Internal Medicine

## 2014-11-16 ENCOUNTER — Other Ambulatory Visit: Payer: Self-pay

## 2014-11-16 NOTE — Patient Outreach (Signed)
[facility]) Care Management  11/16/2014  Alfonse FlavorsSamuel Ellis Ramseyer Oct 15, 1933 [id number]  RN CM spoke with patient's wife, Talbert ForestShirley with his verbal consent about [facility].  RN CM asked Mrs. Marigene Ehlersrull how was patient managing his hypertension and congestive heart failure. Mrs. Marigene Ehlersrull states she takes care of everything.  States she prepares his medications and understands why he take each medication. States she cooks all meals and understands patient should have low fat, low sodium, and low cholesterol foods.  States that can sometimes be a challenge. States patient has a very good appetite. Wife states she weighs patient everyday on their scales.  States patient has gained 3 pounds last week.  She called the cardiologist, Dr. Arva ChafeParachu and got his dosage of diuretic increased and patient's weight is down. Patient has an appointment this month with his primary doctor, orthopedic doctor and his cardiologist.  Transportation is not an issue as patient is able to drive.  Mrs. Marigene Ehlersrull states she understands how to help patient self-manage his chronic illnesses.  States she did not need any additional education on managing patient's health. She decline the services of [facility].   RN CM will notify primary doctor of their decision.    Wynonia Lawmanheryl Cecia Egge, RN, Lowella DellMHA, [facility] Telephonic Care Coordinator [phone]

## 2014-11-22 NOTE — Patient Outreach (Signed)
[facility]) Care Management  11/22/2014  Alfonse FlavorsSamuel Ellis Steuck [birth year] [id number]   Received notification from Wynonia Lawmanheryl Poteat, RN to close case due to consumer decline to participate with [facility] services.  Case closed at this time.  Corrie MckusickLisa O. [facility] Care [facility] CM Assistant Phone: [phone] Fax: [phone]

## 2014-11-30 NOTE — Op Note (Signed)
PATIENT NAME:  Ronald Good, Ronald Good MR#:  [id number] DATE OF BIRTH:  August 27, 1933  DATE OF PROCEDURE:  05/28/2012  PREOPERATIVE DIAGNOSES:  1. Bilateral iliac artery aneurysms, right greater than left.  2. Peripheral arterial disease with rest pain left lower extremity, claudication right lower extremity.  3. History of previous tube graft for infrarenal aortic aneurysm repair.  4. Hypertension.  5. Atrial fibrillation.   POSTOPERATIVE DIAGNOSES:  1. Bilateral iliac artery aneurysms, right greater than left.  2. Peripheral arterial disease with rest pain left lower extremity, claudication right lower extremity.  3. History of previous tube graft for infrarenal aortic aneurysm repair.  4. Hypertension.  5. Atrial fibrillation.   PROCEDURES PERFORMED: 1. Bilateral femoral artery cutdowns for placement of endoprosthesis, right by Dr. Levora DredgeGregory Schnier and left by Dr. Festus BarrenJason Dew. 2. Placement of catheter into aorta from left femoral approach by Dr. Wyn Quakerew and to left femoral artery from right femoral approach by Dr. Gilda CreaseSchnier. 3. Placement of an Endologix Unibody endoprosthesis, co-surgeons Dew and Teaching laboratory technicianchnier. 4. Placement of right iliac extender, 16 mm diameter, by Dr. Gilda CreaseSchnier. 5. Placement of left iliac extender 20 mm diameter, by Dr. Wyn Quakerew.  6. Right femoral endarterectomy by Dr. Gilda CreaseSchnier.   SURGEON:  Festus BarrenJason Dew, MD  CO-SURGEON: Levora DredgeGregory Schnier, MD  ANESTHESIA: General.   ESTIMATED BLOOD LOSS: Approximately 1000 mL.  FLUOROSCOPY TIME: Approximately 20 minutes.   INDICATION FOR PROCEDURE: This is a 81 year old male who has bilateral iliac aneurysms. He also has significant occlusive disease with severe symptoms, worse on the left than the right. He has tight left iliac stenosis associated with these aneurysms below and the right common femoral artery was nearly occluded. For these reasons, a concomitant femoral endarterectomy and treatment of his occlusive disease and aneurysm repair was planned. He had  had a previous tubal graft aortic aneurysm repair which is quite short but appeared patent. The risks and benefits were discussed and informed consent was obtained.   DESCRIPTION OF PROCEDURE: The patient was brought to the vascular interventional radiology suite with the help of our anesthesia colleagues providing anesthetic. His abdomen and groin were sterilely prepped and draped and a sterile surgical field was created. We initially started with percutaneous access on the left; I did this under ultrasound guidance. I placed an 8 [place]French sheath. Dr. Gilda CreaseSchnier performed the cut down on the right and placed a sheath for the planned endarterectomy to be performed at the occlusion of the procedure for hemostasis reasons. A pigtail catheter was placed up into the right and aortogram was performed. We then placed a Lundquist wire and a large sheath up the right. I placed a Kumpe catheter and exchanged for a snare in the aorta from the left femoral approach, Dr. Gilda CreaseSchnier placed the main body, and the contralateral wire was easily snared and pulled out through the left femoral sheath with the existing catheter attached. We parked the main body device on the aortic bifurcation. Dr. Gilda CreaseSchnier began the deployment. I then completed deployment by placing an 0.014 wire through the contralateral catheter and unsheathing the delivery system to complete the deployment. Following this there was still significant constraint in the left common iliac artery, at the area of high-grade stenosis, as well as lack of seal within the aneurysmal portion of the iliac artery on both sides. Balloon angioplasty was performed on the left iliac and the right iliac in a kissing balloon fashion and a Compliant balloon was used in the aortic component. I ruptured two balloons trying to  open the left iliac, including a Conquest balloon. The aneurysm was also not sealed on the left and so we made a small cutdown overlying the existing wire and the femoral  artery to be able to up-size to a larger sheath and removed the burst balloon. This was done, a 12 [place] sheath was placed, and I used a 20 mm diameter left iliac extender on the left and brought this down to just above the hypogastric artery then ironed out the seal zones and junction points with the Compliant balloon with improvement in the appearance of the left iliac stenosis. Dr. Gilda Crease also had to extend the iliac on the right and a 16 mm diameter extender was brought down just above the iliac bifurcation and he ironed these out with the Compliant balloon as well. Completion angiogram showed excellent flow through the endoprosthesis without type I, II or III endoleaks and improvement in the stenotic flow. It should be noted the patient was given a total of 7000 units of intravenous heparin for systemic anticoagulation during the procedure. I closed the arterial access site on the left with four  5-0 Prolene sutures and the groin was closed with a 2-0 Vicryl, three layers of 3-0 Vicryl, and a 4-0 Monocryl. On the right side, Dr. Gilda Crease then controlled the artery and the common femoral, profunda femoris, and superficial femoral artery. A vertical incision was created in the artery and he proceeded with an endarterectomy using a Runner, broadcasting/film/video. A nice proximal endpoint was gained and the distal endpoint was cut with a nice tacked down endpoint prior to the femoral bifurcation. All loose flecks were removed and the vessel was locally heparinized. Due to the large size of the artery at this location, Dr. Gilda Crease performed a primary closure with two 6-0 Prolene sutures. A single 6-0 Prolene patch suture was used for hemostasis. He then closed the groins with two layers of 2-0 Vicryl, two layers of 3-0 Vicryl, and then a 4-0 Monocryl. Sterile dressing was placed. The patient tolerated the procedure well and was taken to the recovery room in stable condition. ____________________________ Annice Needy,  MD jsd:slb D: 05/28/2012 11:53:17 ET T: 05/28/2012 12:06:41 ET JOB#: [id number]  cc: Annice Needy, MD, <Dictator> Marisue Ivan, MD Annice Needy MD ELECTRONICALLY SIGNED 06/02/2012 13:10

## 2014-11-30 NOTE — Op Note (Signed)
PATIENT NAME:  VIRLAN, KEMPKER MR#:  [id number] DATE OF BIRTH:  [birth year]  DATE OF PROCEDURE:  05/28/2012  PREOPERATIVE DIAGNOSES:  1. Bilateral iliac artery aneurysms associated with an abdominal aortic aneurysm status post previous repair with a tube graft.  2. Atherosclerotic occlusive disease bilateral lower extremities with rest pain of the left lower extremity.  3. Coronary artery disease.  4. Hypertension.  5. Obesity.   POSTOPERATIVE DIAGNOSES:  1. Bilateral iliac artery aneurysms associated with an abdominal aortic aneurysm status post previous repair with a tube graft.  2. Atherosclerotic occlusive disease bilateral lower extremities with rest pain of the left lower extremity.  3. Coronary artery disease.  4. Hypertension.  5. Obesity.   PROCEDURES PERFORMED:  1. Bilateral femoral artery cutdowns Dr. Gilda Crease right side, Dr. Wyn Quaker left side.   2. Introduction catheter into aorta from the right side.  3. Introduction catheter into aorta from the left side.  4. Repair of iliac artery aneurysms using an Endologix bifurcated stent graft, unibody #6 iliac extension, right side.  5. Iliac extension, left side.  6. Open femoral endarterectomy, right side.   CO-SURGEONS: Dr. Gilda Crease, Dr. Wyn Quaker   ANESTHESIA: General by endotracheal intubation.   FLUIDS: Per anesthesia record.   ESTIMATED BLOOD LOSS: 1100 mL.   SPECIMEN: Plaque from right femoral to pathology for permanent section.   CONTRAST USED: Isovue 92 mL.   FLUORO TIME: 20.1 minutes.   INDICATIONS: Mr. Bloyd is a 81 year old gentleman who has been followed in the office and has been noted to have increasing pain in his left lower extremity with worsening atherosclerotic occlusive disease. By CT scan he is also noted to have steady enlargement of his common iliac artery aneurysm on the right as well as the left but the right side has now achieved size of approximately 4 cm and therefore should be repaired to prevent lethal  rupture. The risks and benefits for treatment of his occlusive disease within the femoral and iliac system in association with his aneurysmal disease was reviewed in detail. All questions were answered. Patient has agreed to proceed.   DESCRIPTION OF PROCEDURE: Patient is taken to the special procedure suite, placed in supine position. After adequate general anesthesia is induced and appropriate invasive monitors are placed, he is positioned supine and prepped from approximately his nipple line to his knees. Beginning on the right side a vertical incision is made after appropriate timeout is called. Incision is then carried down to expose the ilioinguinal ligament and subsequently open femoral sheath and expose the common femoral artery. Femoral artery is noted to be densely calcified throughout its entire course and therefore the dissection is extended to expose the distal external iliac artery where it is looped with a silastic vessel loop. Profunda femoris and superficial femoral arteries are looped at their origins as well.   Beginning percutaneously on the left side as we were uncertain as to whether extension on the left would be required, Dr. Wyn Quaker utilized ultrasound to access the common femoral artery on the left. Ultrasound is placed in a sterile sleeve. Ultrasound was used to evaluate the artery, which is noted to be echolucent and pulsatile. Significant plaque was noted in the posterior areas but the anterior wall appeared soft. Image is recorded for the permanent record. Patency is noted based on pulsatility and access is done under continuous visualization. Seldinger needle is used and a J-wire is advanced followed by advancing a Kumpe catheter into the aorta. 8 French sheath is then  placed on the right through the open cut down. J-wire and pigtail catheter are then placed. Bolus injection of contrast is utilized to demonstrate the location of the renals as well as the bifurcation and subsequently a  25 x 80 x 55 unibody Endologix device is selected, opened and prepped on the field. Lunderquist wire is then advanced through the pigtail and the 8 French sheath is removed and the 17 French delivery sheath is advanced under fluoroscopic guidance over the Lunderquist wire. The main body is then engaged and subsequently the contralateral wire is advanced through the sheath. Dr. Wyn Quakerew working from the left side advanced the snare into the aorta and the wire is then advanced through the snare and captured and pulled out the left side. The wire is then advanced as the main body is delivered. Subsequently the main body is unfurled and seated onto the bifurcation. The main body is then opened. The delivery system is then removed. The catheter wire is then transected and a grand slam wire is advanced into the descending aorta. The contralateral leg is then opened removing the capture wire and the sheath constraining the left limb. Coda balloon is then used to seal proximally. At this time bolus injection of contrast is utilized which demonstrates that both the right and left iliac limbs will need to be extended. Associated with the patient's presenting problems was a greater than 90% stenosis at the origin of the common iliac on the left as well as greater than 80% stenosis of the mid common femoral on the right. In order to treat these lesions; iliac one first wire on the left side was exchanged for a 0.035 and initially a 9 x 40 [place] balloon was advanced up both sides simultaneously in the kissing balloon technique. Unfortunately given the sharp very dense nature of the plaque on the left, the balloon did rupture. These balloons were then removed. Ultimately a second balloon did rupture, however, utilizing a 12 x 40 Conquest balloon we were able to dilate the left iliac to approximately 10 mm with a 12 atmosphere inflation for this to demonstrate loss of contrast and was found to have a small puncture in it. Right side  dilated without difficulties. Cutdown was then performed on the left side as the extension for the left iliac would require a 12 French sheath.    A 16 x 55 extension was then prepped and delivered on the right. A 20 x 20 was prepped and delivered on the left, both over the Lunderquist wires. Deployments were without difficulty and subsequently a Coda balloon was used to seal the right and left limbs.   Pigtail catheter was then introduced up the left sheath which had been upsized after removing the delivery system for the left limb to a 12 [place] Cook sheath and bolus injection of contrast was performed with delayed imaging. No type 2 endoleaks are noted. There appears to be a reasonable seal on the left and a small amount of staining in the aneurysm but the limb does extend down to the bifurcation. The right side is sealed as well. No type 2 endoleak is noted.   The sheath is then removed on the left and multiple interrupted 5-0 Prolene sutures are used to repair the arteriotomy. Once this is completed attention is turned to the right. Sheath is removed on the right. The proximal and distal arteries are clamped. Arteriotomy is extended with Potts scissors and endarterectomy is performed under direct visualization. Because the common  femoral artery was mildly aneurysmal it is repaired primarily using running 6-0 Prolene. Flushing maneuvers are performed and flow is established to the femoral bifurcation and distally. By palpation there is a strong pulse noted in the superficial femoral. Evicel is then placed with Surgicel along the suture line. Evicel is placed on the left side as well and then both groins are closed in multiple layers using 2-0 Vicryl followed by 3-0 Vicryl followed by 4-0 Monocryl subcuticular.   The patient tolerated the procedure well. There were no episodes of hemodynamic instability and he is taken to the recovery room in stable condition.    ____________________________ Renford Dills, MD ggs:cms D: 05/28/2012 12:19:30 ET T: 05/28/2012 12:36:22 ET JOB#: [id number]  cc: Renford Dills, MD, <Dictator> Marisue Ivan, MD  Renford Dills MD ELECTRONICALLY SIGNED 06/24/2012 12:11

## 2014-11-30 NOTE — Op Note (Signed)
PATIENT NAME:  Ronald Good, Ronald E MR#:  [id number] DATE OF BIRTH:  [birth year]  DATE OF PROCEDURE:  04/22/2012  PREOPERATIVE DIAGNOSES:  1. Atherosclerotic occlusive disease of bilateral lower extremities with rest pain and ulceration of the left lower extremity.  2. Abdominal aortic aneurysm with history of remote repair using a tube graft.  3. Bilateral iliac artery aneurysms.  4. Congestive heart failure.  5. Coronary artery disease.   POSTOPERATIVE DIAGNOSES:  1. Atherosclerotic occlusive disease of bilateral lower extremities with rest pain and ulceration of the left lower extremity.  2. Abdominal aortic aneurysm with history of remote repair using a tube graft.  3. Bilateral iliac artery aneurysms.  4. Congestive heart failure.  5. Coronary artery disease.   PROCEDURES PERFORMED:  1. Abdominal aortogram.  2. Left lower extremity distal runoff.   SURGEON: Renford DillsGregory G. Schnier, M.D.   SEDATION: Versed 3 mg plus fentanyl 100 mcg administered IV. Continuous ECG, pulse oximetry and cardiopulmonary monitoring was performed throughout the procedure by the interventional radiology nurse. Total sedation time was 45 minutes.   ACCESS: 5 French sheath, right common femoral artery.   FLUOROSCOPY TIME: 1.1 minutes.   CONTRAST USED: Isovue 70 mL.   INDICATIONS: Mr. Marigene Ehlersrull is a 81 year old gentleman with an extensive past medical history as noted above. He has worsening pain in his left lower extremity as well as a persistent ulcer. Physical examination as well as noninvasive studies demonstrate profound atherosclerotic occlusive disease and he is undergoing angiography to better define this process with the hope for possible intervention. Risks and benefits were reviewed, all questions answered, and the patient agrees to proceed.   DESCRIPTION OF PROCEDURE: The patient is taken to special procedures and placed in the supine position. After adequate sedation is achieved, the right groin is prepped  and draped in a sterile fashion. Ultrasound is placed in a sterile sleeve. Ultrasound is utilized secondary to lack of appropriate landmarks and to avoid vascular injury. Under direct ultrasound visualization, the common femoral artery is identified. It is echolucent and pulsatile. There is significant anterior plaque noted near the level of the ligament. However, the anterior wall appears relatively normal slightly distal to this area. This is then selected for access. Micropuncture needle followed by microwire and microsheath is then placed, J-wire followed by 5 [place]French sheath and 5 French pigtail catheter. The pigtail catheter is positioned at the level of T12 and AP projection of the aorta is obtained. This is image is of limited quality secondary to the patient's body habitus; however, it does not appear that there are any hemodynamically significant stenoses. It appears that he is status post repair, being a tube graft, with what seems to be a very limited aortic resection many centimeters distal to the renals. Pigtail catheter is then repositioned and an oblique view of the pelvis is obtained clearly demonstrating very large bilateral iliac artery aneurysms as well as a 90% stenosis at the origin of the left common iliac.   I-I is then repositioned and AP runoff is done of the left leg. After reviewing the images, a Mynx device is deployed successfully and there are no immediate complications.   INTERPRETATION: As noted above, the aorta image is of limited quality and insufficient for defining the anatomy at the level of the renal arteries. There does appear to be a short segment distal aortic tube graft placed. Bilateral common iliac artery aneurysms are noted. External iliac arteries are relatively free of disease and do not demonstrate hemodynamically significant  stenoses.   The right common femoral artery demonstrates a greater than 60% stenosis in its proximal one half. Distally there is diffuse  disease but it appears to be otherwise patent. Proximal SFA and profunda are diffusely diseased but patent.   The left common femoral demonstrates a lesion at the level of the ilioinguinal ligament. This could be distal external or proximal common femoral. It is approximately 50% and shelf-like in its nature. Profunda femoris is patent. Superficial femoral artery is patent but diffusely diseased in its proximal one third. In the midportion, it demonstrates diffuse hemodynamically significant stenoses and it occludes for approximately 10 cm, at Hunter's canal. With the knee flexed to try to visualize around the knee replacement, the popliteal artery appears patent. In its proximal half, there is a small segment that is persistently nonvisualized distally. In the AP projection, there appears to be high-grade stenosis of greater than 80%, at the origin of the tibioperoneal trunk. Peroneal appears to be single vessel runoff to the ankle.   IMPRESSION: The patient has a unique situation with greater than 90% stenosis of the left iliac artery at its origin in association with bilateral large iliac artery aneurysms. This situation is likely best resolved with repair of the iliac artery aneurysms. At the same time, the stenosis can be treated. This can be performed with an Endologix graft which would allow for further interventions on either the right or left leg. In the future, consideration for the common femoral artery stenosis, particularly on the right, must be given. ____________________________ Renford Dills, MD ggs:slb D: 04/22/2012 11:49:20 ET T: 04/22/2012 12:08:00 ET JOB#: [id number]  cc: Renford Dills, MD, <Dictator> Marisue Ivan, MD Renford Dills MD ELECTRONICALLY SIGNED 05/13/2012 14:04

## 2014-12-02 DIAGNOSIS — R0681 Apnea, not elsewhere classified: Secondary | ICD-10-CM | POA: Insufficient documentation

## 2014-12-03 NOTE — Discharge Summary (Signed)
PATIENT NAME:  Ronald Good, Ronald Good MR#:  [id number] DATE OF BIRTH:  09/12/1933  DATE OF ADMISSION:  06/11/2013 DATE OF DISCHARGE:  06/14/2013  ADMITTING DIAGNOSIS: Loose tibial implant.   DISCHARGE DIAGNOSIS: Loose tibial implant.   PROCEDURE: Revision of tibial implant.   ANESTHESIA: Spinal.   SURGEON: Leitha SchullerMichael J. Menz, M.D.   ASSISTANT: Devota PaceApril Berndt, NP.   ESTIMATED BLOOD LOSS: 500 mL.   TOURNIQUET TIME: 43 minutes at 300 mmHg.   IMPLANTS: DePuy tibial insert rotating platform, stabilized, size 6, 15 mm thickness and  MBT Revision metaphyseal sleeve 29 mm, cemented, MBT Revision step wedge 10 mm for  a  size 5 tibia x 3, a 75 x 18 mm universal fluted stem with a size 5 MBT Revision tibial platform, antibiotic cement.   SPECIMEN: Removed synovium.   COMPLICATIONS: None.   CONDITION: To recovery room stable.   HISTORY: The patient 81 year old male who has had a CT scan that did show ostial lysis of the tibial component. His blood work has also been negative for infection. He has a history of peripheral stents and sees Dr. Darrold JunkerParaschos for atrial fibrillation and can be off his Coumadin prior to surgery.   PHYSICAL EXAMINATION: LUNGS: Clear.  HEART: Irregular rate and rhythm.  HEENT: Unremarkable.  MUSCULOSKELETAL: On exam he has tenderness in the medial knee with opening to varus stress. He has full extension. Flexion is to 100 degrees with a very large effusion.   HOSPITAL COURSE: The patient was admitted to the hospital on 06/11/2013. He had surgery that same day and was brought to the orthopedic floor from the PAC-U in stable condition. On postoperative day one, the patient had normal labs. The patient did have slight decreased hemoglobin of 11.9. The patient was restarted on his Coumadin. INR was checked and on postoperative day two was 1.4. The patient had good progress with physical therapy throughout his stay. He continued to progress until postoperative day three. He was  ambulating 200 feet as well as able to ambulate stairs and he was ready for discharge to home with home health physical therapy.   CONDITION AT DISCHARGE: Stable.   DISCHARGE INSTRUCTIONS: The patient may gradually increase weight-bearing on the affected extremity. He is to elevate the affected foot or leg and 1 or 2 pillows with the foot higher than the knee. Thigh-high TED hose on both legs and remove at bedtime and replace on arising the next morning. Elevate the heels off the bed. Use incentive spirometry every hour while awake and encouraged cough and deep breathing. He may resume a regular diet as tolerated. Continue using Polar Care unit maintaining a temperature between 40 and 50 degrees. Do not get the dressing or bandage wet or dirty. [facility] if the dressing gets water under it. Leave the dressing on. [facility] if any of the following occur: Bright red bleeding from the incision wound, fever above 101.5 degrees, redness, swelling, or drainage at the incision. Call [facility] if you experience any increased leg pain, numbness or weakness in legs or bowel or bladder symptoms. Home physical therapy has been arranged for continuation of rehab program. Please call [facility] if a therapist has not contacted you within 48 hours of your return home. The patient is recheck INR next week and adjust Coumadin as needed. The [facility] follow[facility]up appointment. He needs to be seen within two weeks.   DISCHARGE MEDICATIONS: Hydrochlorothiazide  25 mg 1 tablet orally  once a day. Metoprolol succinate 25 mg oral tablet 1 tablet orally once a day. Warfarin 4 mg oral tablet 1 tablet orally once a day in the evening. Flomax 0.4 mg one cap orally once a day. Tylenol 500 mg 1 tablet orally four hours as needed for pain or temp greater than 100.4. Oxycodone 5 mg, 1 to 2 tabs orally every 4 to 6 hours as needed for pain. Magnesium hydroxide 8% oral suspension 30  mL only 2 times a day as  needed for constipation, Mylanta 30 mL orally every six hours as needed for indigestion or heartburn. Bisacodyl 10 mg rectal suppository one suppository rectal once a day as needed for constipation, docusate/Senokot 50 mg/8.6 mg oral tablet 1 tablet orally 2 times a day   ____________________________ T. Cranston Neighbor, PA-C tcg:sg D: 06/14/2013 08:55:15 ET T: 06/14/2013 09:21:18 ET JOB#: [id number]  cc: T. Cranston Neighbor, PA-C, <Dictator> Evon Slack [place] ELECTRONICALLY SIGNED 06/19/2013 17:16

## 2014-12-03 NOTE — Op Note (Signed)
PATIENT NAME:  Ronald Good, Ronald Good MR#:  [id number] DATE OF BIRTH:  [birth year]  DATE OF PROCEDURE:  06/11/2013  PREOPERATIVE DIAGNOSIS: Loose tibial implant.   POSTOPERATIVE DIAGNOSIS:  Loose tibial implant.  PROCEDURE: Revision tibial implant.   ANESTHESIA: Spinal.   SURGEON: Kennedy BuckerMichael Lonie Rummell, M.D.   ASSISTANT:  Devota PaceApril Berndt, nurse practitioner.   DESCRIPTION OF PROCEDURE: The patient was brought to the operating room and after adequate anesthesia was obtained, the left leg was prepped and draped in the usual sterile fashion. After appropriate patient identification and timeout procedures were completed, the prior incision was opened with medial parapatellar arthrotomy. 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 This was debrided throughout the case, removing most of the synovitis and dark tissue with a portion sent as a specimen. The femur was examined, and the femoral component appeared to be well seated. There did not appear to be evidence of osteolysis or loosening around the periphery of the implant, and this was left in place. Exposure of the proximal tibia was carried out, and the prior tibial polyethylene component was removed, and the tibial metal component was found to be grossly loose across its entire surface. It was removed with care, preventing further bone damage. After this was removed, it appeared that there was subsidence across the medial aspect without fracture. There was no osteolysis. The area of the cement in the center peg was drilled to allow for subsequent removal and sequential hand reaming was carried up to an 18 mm hand reamer. A cutting guide was applied and proximal tibia cut was carried out, removing the loose cement and a small portion of bone. The center hole was opened further to try to get the component to sit more in the midline, and this was carried out with a 6 femur, a 5 tibial component was  required, and trials were placed. Subsequently, a 10 mm augment was determined to be required and this construct was placed with a stemmed trial as well. This seemed to fit well with polyethylene insert. A stable knee could be obtained. The trial components were removed, and the tourniquet raised. Exparel was injected in the periphery of the joint to aid in postoperative analgesia. After thoroughly irrigating and drying the bone and the tibial component assembled, the tibial component was cemented into place and the knee held in extension as the cement set. Excess cement was removed and final trials were placed and the final flat polyethylene component inserted with excellent stability, full range of motion with flexion to 120 and excellent patellofemoral tracking. The wound was thoroughly irrigated, tourniquet let down. The arthrotomy repaired using Ethibond and a heavy quill, 2-0 quill subcutaneously and skin staples. Xeroform, 4 x 4's, ABD, Webril and Ace wrap were applied, along with a Polar Care device and knee immobilizer. The patient was sent to the recovery room in stable condition.   ESTIMATED BLOOD LOSS: 500.   TOURNIQUET TIME: 43 minutes at 300 mmHg.   IMPLANTS: DePuy tibial insert rotating platform, stabilized, size 6, 15 mm thickness. An MBT Revision metaphyseal sleeve 29 mm, cemented, MBT Revision step wedge 10 mm for a size 5 tibia x 3, a 75 x 18 mm universal fluted stem with a size 5 MBT Revision tibial platform, antibiotic cement.   SPECIMEN: Removed synovium.   COMPLICATIONS: None.   CONDITION: To recovery room stable.     ____________________________ Leitha SchullerMichael J. Kalleigh Harbor, MD mjm:dmm D:  06/11/2013 21:56:30 ET T: 06/11/2013 22:51:51 ET JOB#: [id number]  cc: Leitha Schuller, MD, <Dictator> Leitha Schuller MD ELECTRONICALLY SIGNED 06/12/2013 12:43

## 2014-12-04 NOTE — Consult Note (Signed)
Chief Complaint:  Subjective/Chief Complaint seen for diarrhea, possible c diff.  no diarrhea earlier today but a loose stool this pm. continues some mild rlq discomfort, improving. Pradaxa noted held this am, now on heparin.  no nausea.   VITAL SIGNS/ANCILLARY NOTES: **Vital Signs.:   17-Dec-15 09:03  Vital Signs Type Pre Medication  Pulse Pulse 93  Systolic BP Systolic BP 812  Diastolic BP (mmHg) Diastolic BP (mmHg) 75  Mean BP 90    13:23  Vital Signs Type Routine  Temperature Temperature (F) 97.9  Celsius 36.6  Temperature Source oral  Pulse Pulse 87  Respirations Respirations 18  Systolic BP Systolic BP 751  Diastolic BP (mmHg) Diastolic BP (mmHg) 70  Mean BP 83  Pulse Ox % Pulse Ox % 97  Pulse Ox Activity Level  At rest  Oxygen Delivery Room Air/ 21 %  *Intake and Output.:   17-Dec-15 13:24  Stool  large soft    13:26  Stool  Large loose bowel movement.   Brief Assessment:  Cardiac Irregular   Respiratory clear BS   Gastrointestinal details normal Soft  Nondistended  No masses palpable  Bowel sounds normal  No rebound tenderness  mild tenderness rlq.   Lab Results: Routine Chem:  17-Dec-15 04:44   Glucose, Serum  124  BUN 13  Creatinine (comp) 0.90  Sodium, Serum  134  Potassium, Serum  3.1  Chloride, Serum 107  CO2, Serum 26  Calcium (Total), Serum  8.0  Anion Gap  1  Osmolality (calc) 270  eGFR (African American) >60  eGFR (Non-African American) >60 (eGFR values <18m/min/1.73 m2 may be an indication of chronic kidney disease (CKD). Calculated eGFR, using the MRDR Study equation, is useful in  patients with stable renal function. The eGFR calculation will not be reliable in acutely ill patients when serum creatinine is changing rapidly. It is not useful in patients on dialysis. The eGFR calculation may not be applicable to patients at the low and high extremes of body sizes, pregnant women, and vegetarians.)  Routine Hem:  17-Dec-15 04:44   WBC  (CBC)  11.6  RBC (CBC)  3.60  Hemoglobin (CBC)  12.1  Hematocrit (CBC)  36.8  Platelet Count (CBC)  131  MCV  102  MCH 33.6  MCHC 32.9  RDW 14.3  Neutrophil % 67.0  Lymphocyte % 17.1  Monocyte % 13.3  Eosinophil % 1.8  Basophil % 0.8  Neutrophil #  7.8  Lymphocyte # 2.0  Monocyte #  1.5  Eosinophil # 0.2  Basophil # 0.1 (Result(s) reported on 29 Jul 2014 at 05:22AM.)   Assessment/Plan:  Assessment/Plan:  Assessment 1) diarrheal illness-improving.  no stool sent for c+s yet.  stool frequency decreasing. currently on metronidazole.  positive for c diff but not a toxigenic species.  I have discussed obtaining a stool for culture and sensitivity as the causative agent may not be the c diff, possibly another organism.   2) lower abdominal pain-improving, this is currently mostly right lower quadrant pain. 3) abnormal ct, with "Mild wall thickening of the distal sigmoid colon and rectum is noted suggesting proctocolitis"   Plan 1) as the patients diarrhea is improving, would continue current treatment.  The flex sig may not be helpful currently, particularly as the remaining abdominal pain is right lowe quadrant, inferring a right colon source.  Possibility of other organism involved in the diarrheal illness, with c diff isolate as noted above.   Stool c+s still pending and may not  be helpful as patietn has recieved 2 antibiotics in the past few days.  I suggest to consider continuing current abx/flagyl for a 7-10 day course. In regard to luminal evaluation, he will need to be off the pradaxa 2-5 days if considering proceedure with intent of biopsy.   If patietn continues to have symptoms despite the antibiotic course, I would recommend a full colonoscopy, for which the pradaxa will need to be held longer than currently (was stopped this am).   I will discuss further with Dr Netty Starring.   Electronic Signatures: Loistine Simas (MD)  (Signed 17-Dec-15 17:01)  Authored: Chief Complaint,  VITAL SIGNS/ANCILLARY NOTES, Brief Assessment, Lab Results, Assessment/Plan   Last Updated: 17-Dec-15 17:01 by Loistine Simas (MD)

## 2014-12-04 NOTE — Discharge Summary (Signed)
PATIENT NAME:  Ronald Good, [name] MR#:  [id number] DATE OF BIRTH:  December 05, 1933  DATE OF ADMISSION:  06/26/2014 DATE OF DISCHARGE:  07/01/2014  DISCHARGE DIAGNOSES: 1.  Sepsis.  2.  Leg wounds.  3.  Atrial fibrillation, on Pradaxa.  4.  Chronic peripheral vascular disease.  5.  New systolic congestive heart failure.   DISCHARGE MEDICATIONS: 1.  Metoprolol succinate 25 mg p.o. daily in the morning.  2.  Acetaminophen 500 mg 1 tab at bedtime as needed for pain.  3.  Calcium with vitamin D 500/200 one tab p.o. b.i.d.  4.  Tamsulosin 0.4 mg daily.  5.  Tramadol 50 mg p.o. q. 6 hours as needed for severe pain.  6.  Pradaxa 150 mg p.o. b.i.d.  7.  Myrbetriq 25 mg extended-release 1 tab daily.  8.  Amiodarone 200 mg p.o. daily.  9.  Calamine topical dressing Unna boots as directed.  10.  Bactrim DS 1 tab p.o. b.i.d. x5 more days.   CONSULTANTS: Cardiology and vascular surgery.   DIAGNOSTIC DATA: The patient had an echocardiogram that did show an EF of 30% to 35%.   Pertinent labs and studies: On the day of discharge, sodium 143, potassium 3.6, creatinine 1.06, hemoglobin 12.5, white blood cell count 11.4, and platelets 111,000.  Blood cultures and urine culture showed no growth to date.   Doppler studies were negative.   BRIEF HOSPITAL COURSE:  1.  Sepsis. The patient initially came in with fever as high as 105 and hypotension consistent with sepsis and elevated white blood cell count as high as 18.4. The patient was placed on multi-dual antibiotics, Vanc and Zosyn. No source of infection was found, except for chronic leg wounds with history of peripheral vascular disease. He was evaluated by vascular surgery who recommended Unna boots. No invasive procedures performed otherwise. He was transitioned over to Bactrim and has done very well, has remained afebrile. White blood cell count has trended down. Plan to discharge on 5 more days of Bactrim.  2.  Atrial fibrillation with RVR. Due to the  acute stress of the hypotension and the sepsis, the patient's atrial fibrillation worsened. He was placed on an amiodarone drip and was transitioned to oral amiodarone, per Dr. Juliann Paresallwood. Plan to continue that for now. Will continue with Pradaxa, continue with metoprolol. He is currently rate controlled and asymptomatic. 3.  Systolic congestive heart failure. This is a new finding. Plan to cover with the beta blocker. Unable to add an ACE at this time due to history of hypertension. My plan is to consider starting this as an outpatient when he follows up in a week.   DISPOSITION: He is in stable condition and will be discharged to home. He does show some generalized weakness. He was evaluated by physical therapy. Do recommend home health with physical therapy and nursing to help with his medications and his leg wounds. He will need to follow up with vascular surgery as an outpatient for the leg wounds.   TOTAL TIME FOR DISCHARGE PLANNING: Greater than 30 minutes.  ____________________________ Marisue IvanKanhka Kyrollos Cordell, MD kl:sb D: 07/01/2014 08:29:06 ET T: 07/01/2014 12:08:04 ET JOB#: [id number]  cc: Marisue IvanKanhka Neilani Duffee, MD, <Dictator> Marisue IvanKANHKA Ramya Vanbergen MD ELECTRONICALLY SIGNED 07/12/2014 8:59

## 2014-12-04 NOTE — Consult Note (Signed)
Brief Consult Note: Diagnosis: acute diarrheal illness,.   Patient was seen by consultant.   Consult note dictated.   Recommend further assessment or treatment.   Comments: Please see full Gi consult, [id number].  Patient with 3 days of diarrhea pta.  C diff checked, and although GDH antigen verifies c diff, there is no toxin a or b and pcr for toxigenic c diff is negative. diarrheal illness may not be related to c diff in this instance.  Patient has not had stool cultures for other organisms, and has been given abx.  I have ordered stool for c+s, however these may not be helpful in context of the above.    Would continue flagyl for now, monitor progress, await culture results.  If would be helpful to do flex sig however he woudl need to be off pradaxa for 3-5 days to consider biopsies.  Following.  Electronic Signatures: Barnetta ChapelSkulskie, Charae Depaolis (MD)  (Signed 16-Dec-15 20:20)  Authored: Brief Consult Note   Last Updated: 16-Dec-15 20:20 by Barnetta ChapelSkulskie, Jakyla Reza (MD)

## 2014-12-04 NOTE — Consult Note (Signed)
Chief Complaint:  Subjective/Chief Complaint continues with lower abdominal pain.  episode of chest pain early this am, currently undergoing evaluation in this regard.  tolerating po, denies nausea.  stools much improved.  States pain is llq, but on exam is rlq.   VITAL SIGNS/ANCILLARY NOTES: **Vital Signs.:   18-Dec-15 13:51  Vital Signs Type Routine  Temperature Temperature (F) 97.7  Celsius 36.5  Pulse Pulse 79  Respirations Respirations 20  Systolic BP Systolic BP 964  Diastolic BP (mmHg) Diastolic BP (mmHg) 78  Mean BP 96  Pulse Ox % Pulse Ox % 97  Pulse Ox Activity Level  At rest  Oxygen Delivery Room Air/ 21 %   Brief Assessment:  Cardiac Irregular   Respiratory clear BS   Gastrointestinal details normal Soft  Nondistended  No masses palpable  Bowel sounds normal  No rebound tenderness  tender to palpation in the lower abdomen, mostly rlq.   Lab Results: Routine Micro:  15-Dec-15 20:17   Micro Text Report URINE CULTURE   ORGANISM 1                >100,000 CFU/ML Escherichia coli   COMMENT                   HOLDING FOR OTHER POTENTIAL PATHOGENS   ANTIBIOTIC                    ORG#1     AMPICILLIN                    I         CEFAZOLIN        S         CEFOXITIN                     R         CEFTRIAXONE                   S         CIPROFLOXACIN                 S         GENTAMICIN                    S         IMIPENEM                      S         LEVOFLOXACIN   S         NITROFURANTOIN                S         Trimethoprim/Sulfamethoxazole S  Organism Name Escherichia coli  Organism Quantity >100,000 CFU/ML  Specimen Source IN AND OUT CATH  Organism 1 >100,000 CFU/ML Escherichia coli  Culture Comment ID TO FOLLOW SENSITIVITIES TO FOLLOW  Culture Comment . HOLDING FOR OTHER POTENTIAL PATHOGENS  Result(s) reported on 30 Jul 2014 at 10:55AM.  16-Dec-15 01:19   Micro Text Report CLOSTRIDIUM DIFFICILE   C.DIFFICILE ANTIGEN       C.DIFFICILE GDH ANTIGEN :  POSITIVE   C.DIFFICILE TOXIN A/B     C.DIFFICILE TOXINS A AND B : NEGATIVE   PCR FOR TOXIGENIC C.DIFF  PCR FOR TOXIGENIC C.DIFFICILE : NEGATIVE   INTERPRETATION Negative for toxigenic  C. difficile. Toxin gene and active toxin production  not detected. May be a nontoxigenic strain of C. difficile bacteria present, lacking the ability to produce toxin.    ANTIBIOTIC                        Cardiology:  16-Dec-15 05:10   ECG interpretation Atrial fibrillation Right bundle branch block Abnormal ECG When compared with ECG of 27-Jul-2014 19:38, Vent. rate has decreased BY  46 BPM Right bundle branch block has replaced Incomplete right bundle branch block ST no longer depressed in Lateral leads Nonspecific T wave abnormality has replaced inverted T waves in Inferior leads Nonspecific T wave abnormality no longer evident in Lateral leads ----------unconfirmed---------- Confirmed by OVERREAD, NOT ([id number]), editor PEARSON, BARBARA ([id number]) on 07/28/2014 12:00:26 PM  Routine Chem:  18-Dec-15 05:03   Glucose, Serum 94  BUN 13  Creatinine (comp) 0.95  Sodium, Serum 140  Potassium, Serum 3.6  Chloride, Serum 106  CO2, Serum 25  Calcium (Total), Serum  8.2  Anion Gap 9  Osmolality (calc) 279  eGFR (African American) >60  eGFR (Non-African American) >60 (eGFR values <38m/min/1.73 m2 may be an indication of chronic kidney disease (CKD). Calculated eGFR, using the MRDR Study equation, is useful in  patients with stable renal function. The eGFR calculation will not be reliable in acutely ill patients when serum creatinine is changing rapidly. It is not useful in patients on dialysis. The eGFR calculation may not be applicable to patients at the low and high extremes of body sizes, pregnant women, and vegetarians.)  Cardiac:  18-Dec-15 08:57   Troponin I 0.02 (0.00-0.05 0.05 ng/mL or less: NEGATIVE  Repeat testing in 3-6 hrs  if clinically indicated. >0.05 ng/mL: POTENTIAL  MYOCARDIAL INJURY.  Repeat  testing in 3-6 hrs if  clinically indicated. NOTE: An increase or decrease  of 30% or more on serial  testing suggests a  clinically important change)  CK, Total  32  CPK-MB, Serum 0.8 (Result(s) reported on 30 Jul 2014 at 09:36AM.)  Routine Hem:  18-Dec-15 05:03   WBC (CBC) 9.9  RBC (CBC)  3.54  Hemoglobin (CBC)  11.9  Hematocrit (CBC)  36.6  Platelet Count (CBC)  138  MCV  104  MCH 33.7  MCHC 32.6  RDW  14.6  Neutrophil % 68.8  Lymphocyte % 17.2  Monocyte % 10.9  Eosinophil % 2.3  Basophil % 0.8  Neutrophil #  6.8  Lymphocyte # 1.7  Monocyte #  1.1  Eosinophil # 0.2  Basophil # 0.1 (Result(s) reported on 30 Jul 2014 at 05:45AM.)   Radiology Results: CT:    16-Dec-15 00:02, CT Abdomen and Pelvis With Contrast  CT Abdomen and Pelvis With Contrast   REASON FOR EXAM:    (1) abd pain; (2) pel pain  COMMENTS:       PROCEDURE: CT  - CT ABDOMEN / PELVIS  W  - Jul 28 2014 12:02AM     CLINICAL DATA:  Nausea, diarrhea, fever.  Abdominal pain.    EXAM:  CT ABDOMEN AND PELVIS WITH CONTRAST    TECHNIQUE:  Multidetector CT imaging of the abdomen and pelvis was performed  using the standard protocol following bolus administration of  intravenous contrast.  CONTRAST:  100 mL of Omnipaque 300 intravenously.    COMPARISON:  CT scan of March 20, 2011.    FINDINGS:  Severe multilevel degenerative disc disease is noted in the lumbar  spine. Visualized lung bases appear normal.    Status post cholecystectomy.  Mildly nodular hepatic margins are  noted suggesting hepatic cirrhosis. The spleen and pancreas appear  normal. Adrenal glands appear normal. No hydronephrosis or renal  obstruction is noted. Nonobstructive calculi are noted in lower pole  collecting system of left kidney. Atherosclerotic calcifications of  abdominal aorta and iliac arteries are noted with 3.2 cm right  common iliac artery aneurysm. The appendix appears normal. There is  no evidence of bowel  obstruction. No abnormal fluid collection is  noted. Urinary bladder appears normal. No significant adenopathy is  noted. Moderate size fat containing right inguinal hernia is noted.  Mild wall thickening of the distal sigmoid colon and rectum is noted  suggesting proctocolitis.     IMPRESSION:  Nonobstructive left nephrolithiasis. No hydronephrosis or renal  obstruction is noted.    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    Moderate size fat containing right inguinal hernia is noted.    Nodular hepatic margins are noted suggesting hepatic cirrhosis.  Mild wall thickening of the distal sigmoid colon and rectum is noted  suggesting proctocolitis.      Electronically Signed    By: Sabino Dick M.D.    On: 07/28/2014 01:46         Verified By: Marveen Reeks, M.D.,   Assessment/Plan:  Assessment/Plan:  Assessment 1) diarrhea, lower abdominal pain.  abnormal ct with proctosigmoid colitis, however pain is rlq mostly.   improving diarrhea.  2) chest pain-evaluation ongling-h/o af noted, now on heparin for anticoagulation in anticipation of colonoscopy on monday   Plan 1) planning for colonoscopy on monday as clinically feasible.  will need to have cardio eval completed.  I have discussed the risks benefots and complications of proceedure to include not limited to bleedingf infection perforation and sedation and he and daughter wish to proceed.   Dr Candace Cruise covering over the weekend.   Electronic Signatures: Loistine Simas (MD)  (Signed 18-Dec-15 16:12)  Authored: Chief Complaint, VITAL SIGNS/ANCILLARY NOTES, Brief Assessment, Lab Results, Radiology Results, Assessment/Plan   Last Updated: 18-Dec-15 16:12 by Loistine Simas (MD)

## 2014-12-04 NOTE — Consult Note (Signed)
Chief Complaint:  Subjective/Chief Complaint Patient more awake today and still feels slightly short of breath and weak denies palpitations or tachycardia   VITAL SIGNS/ANCILLARY NOTES: **Vital Signs.:   17-Nov-15 12:00  Vital Signs Type Routine  Temperature Temperature (F) 97.8  Celsius 36.5  Temperature Source oral  Pulse Pulse 86  Respirations Respirations 16  Systolic BP Systolic BP 412  Diastolic BP (mmHg) Diastolic BP (mmHg) 65  Mean BP 78  Pulse Ox % Pulse Ox % 95  Pulse Ox Activity Level  At rest  Oxygen Delivery Room Air/ 21 %  *Intake and Output.:   17-Nov-15 13:00  Grand Totals Intake:  220 Output:      Net:  220 13 Hr.:  676.4  Oral Intake      In:  120  Percentage of Meal Eaten  25  IV (Primary)      In:  100   Brief Assessment:  GEN well developed, well nourished, no acute distress   Cardiac Regular  murmur present  -- LE edema  --Gallop   Respiratory normal resp effort   Gastrointestinal Normal   Gastrointestinal details normal Soft  Nontender  Nondistended   EXTR negative cyanosis/clubbing, negative edema   Lab Results: Routine Chem:  17-Nov-15 04:06   Result Comment LABS - This specimen was collected through an   - indwelling catheter or arterial line.  - A minimum of 14mls of blood was wasted prior    - to collecting the sample.  Interpret  - results with caution.  Result(s) reported on 29 Jun 2014 at 04:29AM.  Glucose, Serum  116  BUN 18  Creatinine (comp)  1.32  Sodium, Serum 139  Potassium, Serum  3.4  Chloride, Serum 106  CO2, Serum 26  Calcium (Total), Serum  7.4  Anion Gap 7  Osmolality (calc) 280  eGFR (African American) >60  eGFR (Non-African American)  56 (eGFR values <74mL/min/1.73 m2 may be an indication of chronic kidney disease (CKD). Calculated eGFR, using the MRDR Study equation, is useful in  patients with stable renal function. The eGFR calculation will not be reliable in acutely ill patients when serum creatinine  is changing rapidly. It is not useful in patients on dialysis. The eGFR calculation may not be applicable to patients at the low and high extremes of body sizes, pregnant women, and vegetarians.)  Routine Hem:  17-Nov-15 04:06   WBC (CBC)  18.4  RBC (CBC)  3.68  Hemoglobin (CBC)  12.7  Hematocrit (CBC)  38.3  Platelet Count (CBC)  99  MCV  104  MCH  34.5  MCHC 33.1  RDW  14.6  Neutrophil % 83.5  Lymphocyte % 9.2  Monocyte % 6.2  Eosinophil % 0.8  Basophil % 0.3  Neutrophil #  15.3  Lymphocyte # 1.7  Monocyte #  1.1  Eosinophil # 0.2  Basophil # 0.1   Radiology Results: XRay:    14-Nov-15 18:36, Chest Portable Single View  Chest Portable Single View   REASON FOR EXAM:    Sepsis  COMMENTS:       PROCEDURE: DXR - DXR PORTABLE CHEST SINGLE VIEW  - Jun 26 2014  6:36PM     CLINICAL DATA:  Fever tachycardia cough initial evaluation, personal  history of atrial fibrillation and hypertension    EXAM:  PORTABLE CHEST - 1 VIEW    COMPARISON:  03/20/2014    FINDINGS:  Moderate cardiac enlargement similar to prior study. Numerous  devices overlie the thorax limiting  evaluation.    Similar the prior study there is mild vascular congestion and mild  diffuseinterstitial prominence. No consolidation or effusion.     IMPRESSION:  Mild cardiogenic interstitial pulmonary edema similar to prior  study.      Electronically Signed    By: Skipper Cliche M.D.    On: 06/26/2014 18:56       Verified By: Rachael Fee, M.D.,    [phone] 20:20, Hip Left Complete  Hip Left Complete   REASON FOR EXAM:    infection sepsis  COMMENTS:       PROCEDURE: DXR - DXR HIP LEFT COMPLETE  - Jun 26 2014  8:20PM     CLINICAL DATA:  High fever, left hip pain    EXAM:  LEFT HIP - COMPLETE 2+ VIEW    COMPARISON:  None.    FINDINGS:  Four cannulated left femoral neck screws transfixing a healed left  femoral neck fracture without failure or complication. No acute  fracture or  dislocation. Generalized osteopenia. Degenerative  changes of bilateral SI joints and lower lumbar spine.    Aorto bi-iliac stent graft noted. There is peripheral vascular  atherosclerotic disease.     IMPRESSION:  No acute osseous injury of the left hip.      Electronically Signed    By: Kathreen Devoid    On: 06/26/2014 21:01       Verified By: Jennette Banker, M.D., MD    15-Nov-15 11:50, Chest Portable Single View  Chest Portable Single View   REASON FOR EXAM:    PICC line placement  COMMENTS:       PROCEDURE: DXR - DXR PORTABLE CHEST SINGLE VIEW  - Jun 27 2014 11:50AM     CLINICAL DATA:  81 year old male status post placement of right  upper extremity PICC    EXAM:  PORTABLE CHEST - 1 VIEW    COMPARISON:  A prior chest x-ray 06/26/2014    FINDINGS:  New right upper extremity PICC. The catheter tip projects over the  superior cavoatrial junction in good position. Stable cardiomegaly.  Pulmonary vascular congestion with mild interstitial edema is  similar to incrementally improved compared to yesterday.  Atherosclerotic calcifications present in the transverse aorta. The  external defibrillator pads have been removed. Mild bibasilar  subsegmental atelectasis. No acute osseous abnormality.     IMPRESSION:  The tip of the new right upper extremity approach PICC projects over  the superior cavoatrial junction.    Stable to slightly improved pulmonary vascular congestion and mild  interstitial edema.    Electronically Signed    By: Jacqulynn Cadet M.D.    On: 06/27/2014 12:49         Verified By: Criselda Peaches, M.D.,  [place]:    15-Nov-15 10:38, [place] Color Flow Doppler Low Extrem Bilat (Legs)  [place] Color Flow Doppler Low Extrem Bilat (Legs)   REASON FOR EXAM:    Swelling  COMMENTS:       PROCEDURE: [place]  - US DOPPLER LOW EXTR BILATERAL  - Jun 27 2014 10:38AM     CLINICAL DATA:  80 year old male with bilateral lower extremity  swelling. History of bilateral knee  replacements.    EXAM:  BILATERAL LOWER EXTREMITY VENOUS DOPPLER ULTRASOUND    TECHNIQUE:  Gray-scale sonography with graded compression, as well as color  Doppler and duplex ultrasound were performed to evaluate the lower  extremity deep venous systems from the level of the common femoral  vein and  including the common femoral, femoral, profunda femoral,  popliteal and calf veins including the posterior tibial, peroneal  and gastrocnemius veins when visible. The superficial great  saphenous vein was also interrogated. Spectral Doppler was utilized  to evaluate flow at rest and with distal augmentation maneuvers in  the common femoral, femoral and popliteal veins.    COMPARISON:  03/30/2013 CT    FINDINGS:  RIGHT LOWER EXTREMITY    Deep venous system appears patent and compressible from groin  through popliteal fossae bilaterally.  Spontaneous venous flow present with evidence of respiratory  phasicity. Augmentation intact.    No intraluminal thrombus identified.    Visualized portions of the greater saphenous veins patent  bilaterally.    Venous Reflux:  Present throughout the deep venous system    Other Findings:  None.    LEFT LOWER EXTREMITY    Deep venous system appears patent and compressible from groin  through popliteal fossae bilaterally.    Spontaneous venous flow present with evidence of respiratory  phasicity. Augmentation intact.    No intraluminal thrombus identified.    Visualized portions of the greater saphenous veins patent  bilaterally.    Venous Reflux:  Present throughout the deep venous system    Other Findings: A 3.3 x 2 x 5.1 cm complicated heterogeneous  structure in the left popliteal fossa likely represents a  complicated Baker cyst as identified on the 03/30/2013 CT.   IMPRESSION:  No evidence of bilateral lower extremity DVT.    3.3 x 2 x 5.1 cm probable complicated Baker cyst in the left  popliteal fossa.      Electronically  Signed    By: Hassan Rowan M.D.    On: 06/27/2014 13:38         Verified By: Lura Em, M.D.,  Cardiology:    14-Nov-15 06:07, ED ECG  Ventricular Rate 158  Atrial Rate 187  QRS Duration 112  QT 278  QTc 450  R Axis 83  T Axis -17  ECG interpretation   Atrial fibrillation with rapid ventricular response  Incomplete right bundle branch block  Marked ST abnormality, possible inferior subendocardial injury  Abnormal ECG  When compared with ECG of 20-Mar-2014 16:57,  ST now depressed in Inferior leads  Nonspecific T wave abnormality now evident in Inferior leads  ----------unconfirmed----------  Confirmed by OVERREAD, NOT ([id number]), editor PEARSON, BARBARA ([id number]) on 06/28/2014 2:35:03 PM  ED ECG     15-Nov-15 12:24, Echo Doppler  Echo Doppler   REASON FOR EXAM:      COMMENTS:       PROCEDURE: [facility] - ECHO DOPPLER COMPLETE(TRANSTHOR)  - Jun 27 2014 12:24PM     RESULT: Echocardiogram Report    Patient Name:   Ronald Good Date of Exam: 06/27/2014  Medical Rec #:  [id number]             Custom1:  Date of Birth:  Dec 07, 1933          Height:  Patient Age:    81 years           Weight:  Patient Gender: M                  BSA:    Indications: Atrial Fib  Sonographer:    Janalee Dane RCS  Referring Phys: MODY, SITAL, P    Sonographer Comments: Technically difficult study due to poor echo   windows.    Summary:   1. Left ventricular ejection fraction,  by visual estimation, is 30 to   35%.   2. Moderately to severely decreased global left ventricular systolic   function.   3. Moderately reduced RV systolic function.   4. Severely enlarged right ventricle.   5. Moderately dilated left atrium.   6. Moderately dilated right atrium.   7. Mild to moderate mitral valve regurgitation.   8. Mild to moderate tricuspid regurgitation.  2D AND M-MODE MEASUREMENTS (normal ranges within parentheses):  Left Ventricle:          Normal  IVSd (2D):      0.87 cm (0.7-1.1)  LVPWd (2D):      0.79 cm (0.7-1.1) Aorta/LA:                  Normal  LVIDd (2D):     4.40 cm (3.4-5.7) Aortic Root (2D): 3.70 cm (2.4-3.7)  LVIDs (2D):     3.75 cm           Left Atrium (2D): 5.00 cm (1.9-4.0)  LV FS (2D):     14.8 %   (>25%)  LV EF (2D):     31.6 %   (>50%)                                    Right Ventricle:                                    RVd (2D):        6.16 cm  SPECTRAL DOPPLER ANALYSIS (where applicable):  Tricuspid Valve and PA/RV Systolic Pressure: TR Max Velocity: 1.85 m/s RA   Pressure: 15 mmHg RVSP/PASP: 28.7 mmHg  PHYSICIAN INTERPRETATION:  Left Ventricle: Not well seen. The left ventricular internal cavity size   was normal. LV septal wall thickness was normal. LV posterior wall   thickness was normal. Global LV systolic function was moderately to   severely decreased. Left ventricular ejection fraction, by visual   estimation, is 30 to 35%.  Right Ventricle: The right ventricular size is severely enlarged. Global   RV systolic function is moderately reduced.  Left Atrium: The left atrium is moderately dilated.  Right Atrium: The right atrium is moderately dilated.  Pericardium: Thereis no evidence of pericardial effusion.  Mitral Valve: The mitral valve is normal in structure. Mild to moderate   mitral valve regurgitation is seen.  Tricuspid Valve: The tricuspid valve is normal. Mild to moderate   tricuspid regurgitation is visualized. The tricuspid regurgitant velocity     is 1.85 m/s, and with an assumed right atrial pressure of 15 mmHg, the   estimated right ventricular systolic pressure is normal at 28.7 mmHg.  Aortic Valve: The aortic valve is normal. Trivial aortic valve   regurgitation is seen.  Pulmonic Valve: The pulmonic valve is normal. No indication of pulmonic   valve regurgitation.    Lander MD  Electronically signed by Hollins MD  Signature Date/Time: 06/28/2014/8:59:20 AM    ***Final ***    IMPRESSION: .    Verified  By: Yolonda Kida, M.D., MD   Assessment/Plan:  Assessment/Plan:  Assessment IMP  Congestive Heart Failure systolic dysfunction  cardiomyopathy moderate  mild renal insufficiency  atrial fibrillation paroxysmal  nonsustained ventricular tachycardia  nonhealing wound  possible sepsis  hypotension resolved  BPH  peripheral vascular disease .  Plan PLAN  I agree with ICU care  agree with weaning pressors  continue fluid hydration  continue broad-spectrum antibiotic therapy  recommend treatment for heart failure  continue rate control for atrial fibrillation  continue long-term anticoagulation for AFib  agree with consulting vascular for nonhealing wound  follow up renal insufficiency consider nephrology input  should be stable transfer to telemetry  switched amiodarone to p.o.   Electronic Signatures: Lujean Amel D (MD)  (Signed 17-Nov-15 19:16)  Authored: Chief Complaint, VITAL SIGNS/ANCILLARY NOTES, Brief Assessment, Lab Results, Radiology Results, Assessment/Plan   Last Updated: 17-Nov-15 19:16 by Yolonda Kida (MD)

## 2014-12-04 NOTE — Consult Note (Signed)
Chief Complaint:  Subjective/Chief Complaint Covering for Dr. Gustavo Lah. NO more abdominal pain or chest pain. Troponin levels have been normal.   VITAL SIGNS/ANCILLARY NOTES: **Vital Signs.:   20-Dec-15 06:04  Vital Signs Type Routine  Temperature Temperature (F) 97.9  Celsius 36.6  Temperature Source oral  Pulse Pulse 77  Respirations Respirations 20  Systolic BP Systolic BP 314  Diastolic BP (mmHg) Diastolic BP (mmHg) 75  Mean BP 89  Pulse Ox % Pulse Ox % 96  Pulse Ox Activity Level  At rest  Oxygen Delivery Room Air/ 21 %   Brief Assessment:  GEN no acute distress   Cardiac Regular   Respiratory clear BS   Gastrointestinal Normal   Lab Results: Hepatic:  19-Dec-15 06:11   Bilirubin, Total 0.6  Alkaline Phosphatase 85 (46-116 NOTE: New Reference Range 03/02/14)  SGPT (ALT) 14 (14-63 NOTE: New Reference Range 03/02/14)  SGOT (AST) 23  Total Protein, Serum 6.7  Albumin, Serum  2.4  Routine Micro:  19-Dec-15 09:16   Micro Text Report STOOL COMPREHENSIVE   COMMENT                   HOLDING FOR POSSIBLE PATHOGEN   COMMENT                   NO CAMPYLOBACTER ANTIGEN DETECTED   ANTIBIOTIC                       Culture Comment HOLDING FOR POSSIBLE PATHOGEN  Culture Comment    . NO CAMPYLOBACTER ANTIGEN DETECTED  Result(s) reported on 01 Aug 2014 at 09:54AM.  Routine Chem:  19-Dec-15 06:11   Glucose, Serum 89  BUN 12  Creatinine (comp) 0.95  Sodium, Serum 140  Potassium, Serum 3.6  Chloride, Serum 106  CO2, Serum 26  Calcium (Total), Serum 8.5  Osmolality (calc) 279  eGFR (African American) >60  eGFR (Non-African American) >60 (eGFR values <1m/min/1.73 m2 may be an indication of chronic kidney disease (CKD). Calculated eGFR, using the MRDR Study equation, is useful in  patients with stable renal function. The eGFR calculation will not be reliable in acutely ill patients when serum creatinine is changing rapidly. It is not useful in patients on  dialysis. The eGFR calculation may not be applicable to patients at the low and high extremes of body sizes, pregnant women, and vegetarians.)  Anion Gap 8  Cardiac:  19-Dec-15 06:11   CK, Total  25  CPK-MB, Serum 0.7 (Result(s) reported on 31 Jul 2014 at 02:18PM.)  Troponin I < 0.02 (0.00-0.05 0.05 ng/mL or less: NEGATIVE  Repeat testing in 3-6 hrs  if clinically indicated. >0.05 ng/mL: POTENTIAL  MYOCARDIAL INJURY. Repeat  testing in 3-6 hrs if  clinically indicated. NOTE: An increase or decrease  of 30% or more on serial  testing suggests a  clinically important change)  Routine Hem:  19-Dec-15 06:11   WBC (CBC) 9.2  RBC (CBC)  3.65  Hemoglobin (CBC)  12.4  Hematocrit (CBC)  37.2  Platelet Count (CBC) 153  MCV  102  MCH 33.9  MCHC 33.3  RDW  14.6  Neutrophil % 59.4  Lymphocyte % 24.7  Monocyte % 10.3  Eosinophil % 4.5  Basophil % 1.1  Neutrophil # 5.5  Lymphocyte # 2.3  Monocyte # 0.9  Eosinophil # 0.4  Basophil # 0.1 (Result(s) reported on 31 Jul 2014 at 06:30AM.)   Assessment/Plan:  Assessment/Plan:  Assessment Abdominal pain. Diarrhea.  Plan Will prep patient for colonoscopy tomorrow with Dr. Gustavo Lah. Thanks.   Electronic Signatures: Verdie Shire (MD)  (Signed 20-Dec-15 10:14)  Authored: Chief Complaint, VITAL SIGNS/ANCILLARY NOTES, Brief Assessment, Lab Results, Assessment/Plan   Last Updated: 20-Dec-15 10:14 by Verdie Shire (MD)

## 2014-12-04 NOTE — Consult Note (Signed)
PATIENT NAME:  Ronald Good, Quantay Good MR#:  [id number] DATE OF BIRTH:  24-Jun-1934  DATE OF CONSULTATION:  07/28/2014  REFERRING PHYSICIAN:   CONSULTING PHYSICIAN:  Christena DeemMartin U. Johnda Billiot, MD  Patient of Marisue IvanKanhka Linthavong, MD.    REASON FOR CONSULTATION: Diarrhea, lower abdominal pain for 4 days, CT with proctitis.   HISTORY OF PRESENT ILLNESS: Mr. Ronald Ehlersrull is a pleasant 81 year old Caucasian male who was in his usual state of health up until about 4 days ago. He states that about 4 days ago he began to have loose stools with decreased appetite as well as some nausea and small volume of emesis. Since that time he has continued with loose stools perhaps 4-5 times a day. There has been no one at home with similar symptoms. Typically he will have a bowel movement once a day that is formed. He has been having some lower abdominal pain/discomfort. He denies any heartburn or dysphagia. He has seen no black stool, blood in the stools, or slimy stools. His last colonoscopy was on 05/01/2011, this for abdominal pain in the left lower quadrant and change of bowel habits as well as a possible history of colitis in the past. Findings on that colonoscopy showed diverticulosis in the sigmoid and descending colon, otherwise normal to gross appearance. Random left colon biopsies done at that time showed colonic mucosa with lymphoid aggregates but no evidence of active colitis or dysplasia. A previous colonoscopy was done in 04/2005, this showing the entire colon to be normal in appearance and the remark that ulcerative colitis was not seen. He states that currently he is still having his last stool being loose, but somewhat less abdominal discomfort. He was started on some oral metronidazole on admission to the hospital. He also received 1 gram of ceftriaxone. It is of note that his stool studies so far have been only for Clostridium difficile.  Findings indicate a positive GDH antigen, however negative for toxin A and B and the PCR was  negative for toxigenic Clostridium difficile. His CT scan showed a nonobstructive left nephrolithiasis. 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 There was marked vascular disease in the aorta and branches. The hepatic margins are nodular suggestive of hepatic cirrhosis and there is some mild wall thickening of the distal sigmoid colon and rectum noted suggesting proctocolitis.   PAST MEDICAL HISTORY:  1.  History of chronic atrial fibrillation on Pradaxa. This agent has been continued thus far in the hospitalization.  2.  Hyperlipidemia.  3.  Hypertension.  4.  History of pericarditis.  5.  History of abdominal aortic aneurysm.  6.  Peripheral vascular disease status post stenting.  7.  Bilateral stents in the legs as noted.  8.  Bilateral knee surgery.  9.  History of cholecystectomy.  10.   History of herniorrhaphy.   ALLERGIES: HE IS ALLERGIC TO HYDROCODONE, NEOSPORIN, AND OXYCODONE.   OUTPATIENT MEDICATIONS: Acetaminophen 500 mg at bedtime p.r.n., amiodarone 200 mg once a day, calcium/vitamin D twice a day with meals, furosemide 20 mg twice a day, metoprolol succinate 25 mg once a day, Myrbetriq 25 mg once a day, Nexium 22.3 mg once a day, topical nystatin cream, Pradaxa 150 mg twice a day, tamsulosin 0.4 mg once a day, tramadol 50 mg every 6 hours p.r.n., and Ventolin HFA.   REVIEW OF SYSTEMS:   Ten systems reviewed per admission history and physical, agree with same. Rash noted in the intertriginous areas.   PHYSICAL EXAMINATION:  VITAL SIGNS: Temperature is  97.6, pulse 73, respirations 19, blood pressure 108/67, pulse oximetry 97%.  GENERAL: He is an elderly-appearing 81 year old Caucasian male in no acute distress. There is some apparent presbycusis.  HEENT: Normocephalic, atraumatic. Eyes are anicteric. Nose, septum midline. Oropharynx, no lesions.  NECK: No JVD.  HEART: Regular rate and rhythm/irregular rate and rhythm.  LUNGS: Bilaterally clear to auscultation.  ABDOMEN:  Soft. He is tender to palpation mostly in the left lower quadrant as well as the right lower quadrant. There are no masses, rebound, or organomegaly. Bowel sounds are positive, normoactive.  EXTREMITIES: No clubbing, cyanosis, or edema.  NEUROLOGICAL: Cranial nerves II through XII grossly intact. Muscle strength bilaterally equal and symmetric. He is somewhat dependent on a walker.   LABORATORY DATA: Yesterday's laboratories on admission showed a glucose of 104, metabolic panel otherwise normal with the exception of potassium 3.1. Hepatic profile showed a total protein of 8.4, albumin 3.1, total bilirubin 1.1, this was not fractionated. Alkaline phosphatase, AST, and ALT were normal. Hemogram showing a white count of 13.4, H and H of 14.3/43.8, platelet count of 153,000, MCV is elevated at 103. Urine culture shows 1 organism greater than 100,000 colony-forming units, ID pending. Again the Clostridium difficile testing showed a positive GDH antigen, however negative for toxin A and B as well as negative for toxigenic Clostridium difficile, indicating negative for toxigenic Clostridium difficile, toxin gene and active toxin production not detected. May be a nontoxigenic strain of Clostridium difficile bacteria present lacking the ability to produce toxin. CT scan as noted above.   ASSESSMENT:  1.  Acute diarrheal illness. This may not be attributable to Clostridium difficile as Clostridium difficile  toxin is not present, nor is the PCR positive for toxigenic Clostridium difficile. There are multiple species of nontoxigenic Clostridium difficile which are also positive for GDH antigen, but do not cause clinical diarrheal syndromes. Possibility of other organism involved, however cultures have not been obtained for these.   2.  Urinary tract infection.   RECOMMENDATION:  1.  I have ordered stool collection for culture and sensitivity. These may not be very helpful in the setting of having a dose of  ceftriaxone and metronidazole as above. However in the interim would continue the metronidazole. It would be helpful to be able to do a flexible sigmoidoscopy, however to consider a flexible sigmoidoscopy his Pradaxa would need to be held for at least 3-5 days if there is consideration of biopsies.  2.  We will follow with you.     ____________________________ Christena Deem, MD mus:bu D: 07/28/2014 20:15:53 ET T: 07/28/2014 21:10:28 ET JOB#: [id number]  cc: Christena Deem, MD, <Dictator> Christena Deem MD ELECTRONICALLY SIGNED 08/03/2014 1:43

## 2014-12-04 NOTE — H&P (Signed)
PATIENT NAME:  Ronald Good, Ronald Good DATE OF BIRTH:  April 20, 1934  DATE OF ADMISSION:  07/28/2014  REFERRING PHYSICIAN:  Enedina Finnerandolph N. Manson PasseyBrown, MD  PRIMARY CARE PHYSICIAN:  Ronald IvanKanhka Linthavong, MD, of [facility]  CHIEF COMPLAINT: 1.  Fever of 102 degrees Fahrenheit.  2.  Diarrhea for the past 2 days.   HISTORY OF PRESENT ILLNESS:  A 81 year old Caucasian male with a past medical history significant for chronic atrial fibrillation, hyperlipidemia, pericarditis, peripheral vascular disease status post stenting, history of hypertension, abdominal aortic aneurysm, bilateral lower extremity edema, who was brought to the Emergency Room by his daughter with the complaints of a fever of 102 degrees Fahrenheit and ongoing diarrhea for the past 2 to 3 days. The patient also has some lower abdominal pain on the left side on and off for the past 2 days. According to the daughter, the patient also has decreased poor oral intake. The patient was recently admitted to [facility] in November 2015 with sepsis and was treated with IV antibiotics. The patient denies any chest pain. No shortness of breath. No palpitations. No cough. No nausea. He did have one vomiting with clear contents. Denies any frequency or urgency. No history of any focal weakness or numbness.   In the Emergency Room, the patient was evaluated by the ED physician, and workup revealed elevated white blood cell count and a urinalysis significant for urinary tract infection and also noted to have Clostridium difficile antigen positive. CT of the abdomen and the pelvis revealed mild wall thickening of the distal colon and rectum suggestive of proctitis. Hence, the hospitalist service was consulted for further evaluation and management. The patient was started on IV Rocephin after urine cultures and also started on p.o. Flagyl. The patient got some pain medications, and currently, his pain is under control.   PAST MEDICAL HISTORY:   1.  Chronic atrial fibrillation on Pradaxa.  2.  Hyperlipidemia.  3.  Hypertension.  4.  History of pericarditis.  5.  History of peripheral vascular disease status post stenting.  6.  History of abdominal aortic aneurysm.  7.  History of chronic bilateral lower extremity edema.   PAST SURGICAL HISTORY: 1.  Bilateral stents in his legs.  2.  Bilateral knee surgery.  3.  Cholecystectomy.  4.  Hernia repair.   ALLERGIES:  1.  OXYCODONE.  2.  HYDROCODONE.  3.  NEOSPORIN.   HOME MEDICATIONS:  1.  Acetaminophen 500 mg tablet 1 tablet orally once a day as needed.  2.  Amiodarone 200 mg tablet 1 tablet orally once a day.  3.  Calcium with vitamin D 500/200 International Units 1 tablet orally 2 times a day.  4.  Furosemide 20 mg tablet 1 tablet orally 2 times a day.  5.  Metoprolol succinate 25 mg tablet 1 tablet orally once a day.  6.  Pradaxa 150 mg 1 tablet orally 2 times a day.  7.  Flomax 0.4 mg 1 tablet orally once a day.  8.  Tramadol 50 mg tablet 1 tablet orally every 6 hours as needed for pain.   FAMILY HISTORY:  Positive for diabetes.   SOCIAL HISTORY:  No history of tobacco, alcohol, or drug usage.   REVIEW OF SYSTEMS:   CONSTITUTIONAL:  Positive for fever of 102 degrees Fahrenheit. Fatigue with generalized weakness present.  EYES:  Negative for blurred vision or double vision. No pain. No redness. No inflammation.  EARS, NOSE, AND THROAT:  Negative for  tinnitus, ear pain, hearing loss, epistaxis, nasal discharge, or difficulty swallowing.  RESPIRATORY:  Negative for cough, wheezing, hemoptysis, dyspnea, or painful respiration.  CARDIOVASCULAR:  Negative for chest pain, orthopnea, dyspnea on exertion, palpitations, syncope, or dizziness.  GASTROINTESTINAL:  Positive for vomiting and diarrhea, diarrhea for 4 days. Lower abdominal pain on the left side present. No rectal bleeding.  GENITOURINARY:  Negative for dysuria, hematuria, or frequency. History of benign prostatic  hypertrophy, for which he takes medications.  ENDOCRINE:  Negative for polyuria, nocturia, or heat or cold intolerance.  HEMATOLOGIC:  Negative for anemia or easy bruising or bleeding.  INTEGUMENTARY:  Negative for acne, rash, or skin lesions.  MUSCULOSKELETAL:  Negative for any overt arthritis, swelling, or gout.   NEUROLOGICAL:  Negative for focal weakness or numbness. No history of CVA, TIA, or seizure disorder.  PSYCHIATRIC:  Negative for anxiety, insomnia, or depression.   PHYSICAL EXAMINATION:  VITAL SIGNS:  On admission, temperature 100.8 degrees Fahrenheit, pulse rate 84 per minute, respirations 18 per minute, blood pressure 120/74, and oxygen saturation 94% on room air.  GENERAL:  A well-developed, well-nourished, elderly male, alert, awake, and oriented x 3, in no acute distress, pleasant and cooperative.  HEAD:  Atraumatic, normocephalic.  EYES:  Pupils are equal and reactive to light and accommodation. No conjunctival pallor. No scleral icterus. Extraocular movements are intact.  NOSE:  No nasal lesions. No drainage.  EARS:  No drainage. No external lesions.  ORAL CAVITY:  No mucosal lesions. No exudates.   NECK:  Supple. No JVD. No thyromegaly. No carotid bruit. Range of motion of the neck is normal.  RESPIRATORY:  Good respiratory effort. Not using accessory muscles of respiration. Clear to auscultation bilaterally.  CARDIOVASCULAR:  S1 and S2, irregularly irregular. No murmurs, gallops, or clicks. Peripheral pulses at carotid normal. Poor peripheral pulses. No peripheral edema.  GASTROINTESTINAL:  Abdomen is soft. Mild tenderness in the left lower quadrant present. No hepatosplenomegaly. No guarding. No rigidity. No rebound. Bowel sounds present and equal in all 4 quadrants.  GENITOURINARY:  Deferred.  MUSCULOSKELETAL:  No tenderness or effusion. Range of motion is adequate in all areas. Strength and tone are equal bilaterally.  SKIN:  Chronic venous stasis changes in the  bilateral lower extremities present; otherwise, no skin lesions.  LYMPHATIC:  Negative for cervical lymphadenopathy.  VASCULAR:  Poor dorsalis pedis and posterior tibial pulses, 1+ bilaterally.  NEUROLOGICAL:  Alert, awake, and oriented x 2. Cranial nerves II through XII are grossly intact. DTRs are 2+ bilaterally and symmetrical in both upper and lower extremities. Motor strength is 5/5 in both upper and lower extremities.  PSYCHIATRIC:  Judgment and insight are adequate. Alert and oriented x 3.Memory and mood are within normal limits.   LABORATORY DATA:  Serum glucose is 104, BUN 13, creatinine 1.05, sodium 138, potassium 3.1, chloride 105, bicarbonate 25, calcium 8.6, lipase 89, total protein 8.4, albumin 3.1, total bilirubin 1.1, alkaline phosphatase 113, AST 31, and ALT is 16. WBC is 13.4, hemoglobin 14.3, hematocrit 43.8, and platelet count is 153,000.   Urinalysis:  Blood 2+, leukocyte esterase 2+, RBC 10 per high-power field, WBC 62 per high-power field, bacteria 1+.   Stool for Clostridium difficile antigen positive, toxin negative.   IMAGING STUDIES:   CT of the abdomen and the pelvis:   1.  Nonobstructive renal calculus. No hydronephrosis or renal obstruction is noted.  2.  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  3.  Mild wall thickening  of the distal sigmoid colon and rectum is noted, suggesting for proctocolitis.   ASSESSMENT AND PLAN:  A 81 year old Caucasian male with a past medical history significant for chronic atrial fibrillation on Pradaxa, hyperlipidemia, peripheral vascular disease status post stents, and recent [facility] admission in November 2015 with sepsis, who presents with a fever of 102 degrees Fahrenheit and diarrhea ongoing for the past 4 days associated with left-sided lower abdominal pain. Workup revealed urinary tract infection and stool positive for Clostridium difficile antigen. CT of the abdomen and pelvis consistent with  mild proctocolitis.   1.  Fever with elevated WBC count with abnormal urinalysis secondary due to urinary tract infection. Plan:  Admit to the medical floor, urine cultures, start Rocephin and Tylenol, follow up urine cultures.  2.  Diarrhea ongoing for the past 4 days with left lower quadrant abdominal pain. Clostridium difficile antigen positive. CT of the abdomen and pelvis consistent with bowel wall thickening consistent with proctocolitis. Plan:  Contact isolation, p.o. Flagyl, and GI consultation for further evaluation.  3.  History of chronic atrial fibrillation with controlled ventricular rate. The patient is on Pradaxa and amiodarone. The patient is stable. Continue same.  4.  Hypertension, controlled. Continue home medications.  5.  Hyperlipidemia, on statin. Continue same.  6.  Benign prostatic hypertrophy on medications, stable. Continue same.  7.  History of peripheral vascular disease status post stenting, stable clinically.  8.  Deep vein thrombosis prophylaxis. The patient is on Pradaxa. Continue Pradaxa. 9.  Gastrointestinal prophylaxis with Protonix.   CODE STATUS:  Full code.   TIME SPENT:  55 minutes.    ____________________________ Crissie Figures, MD enr:nb D: 07/28/2014 05:48:21 ET T: 07/28/2014 06:20:19 ET JOB#: [id number]  cc: Crissie Figures, MD, <Dictator> Ronald Ivan, MD Crissie Figures MD ELECTRONICALLY SIGNED 07/29/2014 17:51

## 2014-12-04 NOTE — Consult Note (Signed)
PATIENT NAME:  Ronald Good, Ronald Good MR#:  [id number] DATE OF BIRTH:  February 21, 1934  DATE OF CONSULTATION:  03/21/2014  REFERRING PHYSICIAN:   CONSULTING PHYSICIAN:  Tashena Ibach D. Decorey Wahlert, MD  INDICATION: Atrial fibrillation, preoperative for hip surgery.   REFERRING PHYSICIAN: Dr. Imogene Burnhen.   PRIMARY CARE PHYSICIAN: Dr. Burnadette PopLinthavong.  HISTORY OF PRESENT ILLNESS: The patient is a 81 year old white male with history of atrial fibrillation on Coumadin, history of peripheral vascular disease, hypertension, hyperlipidemia who presented to the Emergency Room after a fall at home. The patient is awake, alert, mild confusion, but patient's family member states that he has had frequent falls, 3 in the last week. The patient fell by accident and developed left hip pain, but no other symptoms. The patient has had urinary frequency and incontinence, but denies any past blackout spells or syncope. With the new hip pain felt to be a fracture from x-ray the ER, orthopedics was consulted. The patient was found to have atrial fibrillation, on Coumadin. Cardiology was recommended for a preoperative evaluation.   PAST MEDICAL HISTORY: Atrial fibrillation, peripheral vascular disease, hypertension, hyperlipidemia, abdominal aortic aneurysm stent, BPH, obesity.   SOCIAL HISTORY: Retired. No smoking or alcohol consumption.  PAST SURGICAL HISTORY: Peripheral vascular disease stent, aortic aneurysm repair by stent, knee surgery, wrist surgery, hernia repair, cholecystectomy   FAMILY HISTORY: Heart attack, hypertension.   REVIEW OF SYSTEMS: No blackout spells or syncope. Denies nausea, vomiting. Denies fever, chills, sweats. No weight loss. No weight gain. No hemoptysis or hematemesis. Denies bright red blood per rectum. The patient has had some recent falls and now has some left hip pain.   PHYSICAL EXAMINATION:  VITAL SIGNS: Blood pressure 140/90, pulse 95, respiratory rate 16, afebrile.  HEENT: Normocephalic, atraumatic. Pupils  equal and reactive to light.  NECK: Supple. No significant JVD, bruits or adenopathy.  LUNGS: Clear to auscultation and percussion. No significant wheeze, rhonchi, or rales.  HEART: Irregularly irregular, systolic ejection murmur left sternal border. PMI nondisplaced.  ABDOMEN: Benign.  EXTREMITIES:   except for hip dislocation.  NEUROLOGIC: Intact.   LABORATORIES: X-rays left hip shows fracture. CBC normal except platelet count is 117,000. Glucose 107, BUN 15, creatinine 0.81. Electrolytes were normal. INR 2.3. PT 24.9.   EKG: Atrial fibrillation, rate of 90 to about 110, nonspecific ST-T wave changes, incomplete right bundle branch block.   ASSESSMENT: Preoperative for left hip, atrial fibrillation, hypertension, peripheral vascular disease, hyperlipidemia, obesity, benign prostatic hypertrophy.   PLAN:  1. We will hold Coumadin until INR is below 1.4. Hopefully, we will not have to give vitamin K. We will hopefully restart Coumadin as soon as his  after surgery.  2. Continue rate control.  3. Continue lipid management.  4. Continue pain control for hip.  5. Continue blood pressure control with beta blockade therapy, preoperatively and postoperatively.  6. Recommend the patient get physical therapy to help with gait training and maybe a walker to help prevent further falls.  7. Continue Flomax for BPH, as well as pain control.   We will continue to follow the patient with you preoperatively and postoperatively.   ____________________________ Bobbie Stackwayne D. Juliann Paresallwood, MD [id number]:jr D: 03/22/2014 12:18:21 ET T: 03/22/2014 12:40:40 ET JOB#: [id number]  cc: Cadon Raczka D. Juliann Paresallwood, MD, <Dictator> Alwyn PeaWAYNE D Kimarie Coor MD ELECTRONICALLY SIGNED 04/15/2014 12:50

## 2014-12-04 NOTE — H&P (Signed)
PATIENT NAME:  Ronald Good, Ronald Good MR#:  [id number] DATE OF BIRTH:  [birth year]  DATE OF ADMISSION:  03/20/2014  PRIMARY CARE PHYSICIAN:  Marisue Ivan, MD   REFERRING PHYSICIAN:  Dr. Margarita Grizzle  CHIEF COMPLAINT:  Larey Seat today.   HISTORY OF PRESENT ILLNESS: A 82 year old male with a history of atrial fibrillation on Coumadin, PVD, hypertension, hyperlipidemia presented in the ED with a fall today. The patient is awake, alert, oriented, mild confusion. According to the patient and patient's family member, patient has had frequent fall. He fell 3 times this week. The patient fell by accident today, and developed left hip pain after fall but the patient denies any other symptoms. According to the patient's wife, the patient has urine frequency, and incontinence. The patient and the family member deny any past syncope, loss of consciousness, or seizure.   PAST MEDICAL HISTORY: Atrial fibrillation, PVD, hypertension, hyperlipidemia, AAA status post repair, BPH.   SOCIAL HISTORY: No smoking or drinking or illicit drugs.   PAST SURGICAL HISTORY:   1. PVD status post stent placement.  2. Both knee surgeries.  3. Wrist surgery.  4. Hernia repair.  5. Cholecystectomy.  6. AAA repair.   REVIEW OF SYSTEMS: CONSTITUTIONAL: The patient denies any fever or chills. No headache or dizziness. No weakness.  EYES: No double vision, blurred vision.  ENT: No postnasal drip, slurred speech or dysphagia.  CARDIOVASCULAR: No chest pain, palpitation, no orthopnea, nocturnal dyspnea, no leg edema.  PULMONARY: No cough, sputum, shortness of breath, or hemoptysis.  GASTROINTESTINAL: No abdominal pain, nausea, vomiting, diarrhea; no melena, or bloody stool.  GENITOURINARY: No dysuria, hematuria, but has incontinence, urine frequency.  SKIN: No rash or jaundice.  NEUROLOGY: No syncope, loss of consciousness, or seizure.  ENDOCRINE: No polyuria, polydipsia, heat, or cold intolerance.  HEMATOLOGY: No easy bruising,  bleeding.   VITAL SIGNS: Temperature 97.7, blood pressure 142/92, pulse 101, oxygen saturation 95% on room air.   PHYSICAL EXAMINATION:  GENERAL: The patient is alert, awake, oriented, in no acute distress.  HEENT: Pupils round, equal, and reactive to light, and accommodation.  NECK: Supple. No JVD or carotid bruits. No lymphadenopathy. No thyromegaly.  MOUTH:  Moist oral mucous, clear pharynx.  CARDIOVASCULAR: S1 and S2, irregular rate and rhythm, no murmurs, or gallops.  PULMONARY: Bilateral air entry. No wheezing or rales. No use of accessory muscle to breathe.  ABDOMEN: Soft. No distention or tenderness. No organomegaly. Bowel sounds present.  EXTREMITIES: No edema, clubbing, cyanosis. No calf tenderness. Bilateral pedal pulses present; has chronic skin changes on bilateral shins.  NEUROLOGY: AO x 3, but has some mild confusion, no focal deficits, sensation intact, power 5/5.     LABORATORY, DIAGNOSTIC AND RADIOLOGICAL DATA: Left hip x-ray did not show any fracture; however, left hip CAT scan showed impacted subcapital high femoral neck fracture; CBC in normal range, except platelets 117; glucose 107, BUN 15, creatinine 0.81, electrolytes are normal; INR 2.3, and PT 24.9; EKG showed atrial fibrillation with RVR with PVC, at 113, with incomplete right bundle branch block.   FAMILY HISTORY: Hypertension, heart attack.   IMPRESSIONS:  1.  Left hip fracture.  2.  Atrial fibrillation with rapid ventricular response.  3.  Hypertension.  4.  Peripheral vascular disease.  5.  Hyperlipidemia.  6.  Benign prostatic hypertrophy.   PLAN OF TREATMENT:  1.  The patient will be admitted to medical floor with telemonitor, hold the Coumadin; follow up INR, follow up orthopedic surgeon for surgery.  2.  Since patient has atrial fibrillation with RVR we will continue Lopressor, get a cardiology consult for preop clearance.  3.  Pain control.  4.  I discussed the patient's condition, and plan of  treatment with the patient, the patient's wife, and daughters. The patient had some mild confusion. When I asked the patient about code status, he wanted do not resuscitate, do not intubate; however, when nurse repeated the questions, he said he wanted do not resuscitate, do not intubate once and did not want do not resuscitate, do not intubate once.  Family member wanted the patient put on full code, and we will place the patient on full code status.   TIME SPENT: About 63 minutes.    ____________________________ Shaune PollackQing Penney Domanski, MD qc:nt D: 03/20/2014 19:03:12 ET T: 03/20/2014 19:57:54 ET JOB#: [id number]  cc: Shaune PollackQing Dempsey Knotek, MD, <Dictator> Shaune PollackQING Prentiss Polio MD ELECTRONICALLY SIGNED 03/22/2014 13:30

## 2014-12-04 NOTE — Consult Note (Signed)
Brief Consult Note: Diagnosis: Left femoral neck fracture.   Patient was seen by consultant.   Recommend to proceed with surgery or procedure.   Recommend further assessment or treatment.   Orders entered.   Discussed with Attending MD.   Comments: 81 year old male fell at home today injuring the left leg.   Brought to Emergency Room where exam and X-rays and ct scan show a hairline left femoral neck fracture.  Admitted to medicine for evaluation and pre op clearance.  On warfarin and INR is 2.3  Will give Vit K and proceed with surgery late tomorrow or Monday.  No other orthopaedic complaints.  Discussed treatment options with patient and have recommended percutaneous pinning for this fracture.  He is agreeable.  Risks and benefits of surgery were discussed at length including but not limited to infection, non union, nerve or blood vessed damage, non union, need for repeat surgery, blood clots and lung emboli, and death..   Exam:  Alert, on oxygen.  Pain with range of motion left hip.  No shortening. slight rotation. Skin intact.  Prior total knee replacement bilaterally by Dr Rosita KeaMenz.  Brawny edema distally.  circulation/sensation/motor function good and skin intact otherwise.   X-rays:  as above   Rx:  Percutaneous pinning left hip.  Electronic Signatures: Valinda HoarMiller, Marybell Robards E (MD)  (Signed 08-Aug-15 21:10)  Authored: Brief Consult Note   Last Updated: 08-Aug-15 21:10 by Valinda HoarMiller, Virgie Kunda E (MD)

## 2014-12-04 NOTE — H&P (Signed)
PATIENT NAME:  Ronald Good, Ronald Good MR#:  [id number] DATE OF BIRTH:  September 15, 1933  DATE OF ADMISSION:  06/26/2014  PRIMARY CARE PHYSICIAN: Marisue Ivan, MD  CHIEF COMPLAINT: Fever.   HISTORY OF PRESENT ILLNESS: A 81 year old male who was discharge from the hospital at the beginning of August after a hip fracture, who presents today with fever. The patient was very weak today, unable to get out of his bed. Wife called 911 because he was also complaining of chills. When 911 arrived, he had a temperature of 100 there at the home. He was brought here to be evaluated for his weakness. When he got to the ER, his temperature was actually 104. He now has a cooling blanket. He was also, in the Emergency Department, found to be in atrial fibrillation with RVR and had several beats of ventricular tachycardia as well. He is now started on amiodarone drip. He is on broad-spectrum antibiotics, including vancomycin and Zosyn.   REVIEW OF SYSTEMS:  CONSTITUTIONAL: Positive fever, weakness, chills, fatigue. EYES: No blurred or double vision.  ENT: No ear pain, hearing loss, seasonal allergies, postnasal drip.  RESPIRATORY: No cough, wheezing, hemoptysis, dyspnea.  CARDIOVASCULAR: No chest pain, orthopnea, edema, arrhythmia, dyspnea on exertion, palpitations.  GASTROINTESTINAL: No nausea, vomiting, diarrhea, abdominal pain, melena, or ulcers. GENITOURINARY: No dysuria or hematuria.  ENDOCRINE: No thyroid problems. Increased sweating.  HEMATOLOGIC/LYMPHATIC: No bleeding or swollen glands.  SKIN: No rash or lesions.  MUSCULOSKELETAL: No limited activity. He has been doing well with physical therapy at home and had 9 weeks of rehabilitation.  NEUROLOGICAL: No history of CVA, TIAs, or seizures.  PSYCHIATRIC: No history of anxiety or depression.   PAST MEDICAL HISTORY:  1.  Atrial fibrillation.  2.  Hyperlipidemia.  3.  Peripheral vascular disease, status post stents in bilateral lower extremities.  4.  History of  pericarditis.  5.  Bilateral lower extremity edema.  6.  Essential hypertension.  7.  BPH.  PAST SURGICAL HISTORY:  1.  He has bilateral stents in his legs.  2.  He had knee surgery.  3.  Left total knee replacement which was in August 2015.  4.  Cholecystectomy.  5.  Hernia repair.  6.  Right TKR.   MEDICATIONS: We are obtaining the medication list now.   ALLERGIES: HYDROCODONE, NEOSPORIN, OXYCODONE.   FAMILY HISTORY: Positive for diabetes.   SOCIAL HISTORY: No tobacco, alcohol, or drug use.   PHYSICAL EXAMINATION:  VITAL SIGNS: Temperature 105.5, pulse 139, respirations 24, blood pressure 139/88, and 95% on room air.  GENERAL: The patient is alert, in moderate distress from his fever. HEENT: Head is atraumatic. Pupils are round. Sclerae are anicteric. Mucous membranes are moist. Tympanic membranes are clear and good tympanic membrane reflex is noted. Oropharynx without any exudates.  NECK: Supple. No JVD, carotid bruit, or enlarged thyroid.  CARDIOVASCULAR: Tachycardia, irregularly irregular. No murmur, gallops, or rubs.  LUNGS: Clear to auscultation without crackles, rales, rhonchi, or wheezing. Normal to percussion.  ABDOMEN: Obese, soft. Bowel sounds are positive. Nontender. No rebound or guarding. Hard to appreciate organomegaly due to body habitus.  EXTREMITIES: He has chronic edema, right greater than the left.  SKIN: He has chronic changes, which are chronic of his lower extremities.  NEUROLOGIC: Cranial nerves II through XII grossly intact. No focal deficits.   LABORATORY DATA: Magnesium 1.3, phosphorus 1.7. Troponin 0.05. AST 28, ALT 24, total protein 7.7, albumin 3.3, bilirubin 1.0, calcium 8.2, sodium 138, potassium 4.0, chloride 103, bicarbonate 26, BUN  11, creatinine 0.87, glucose is 107. White blood cells 12.3, hemoglobin 14.8, hematocrit 44, platelets are 127,000. Influenza A and B are negative. Urinalysis is negative for LCE and nitrites. PH 7.5, pCO2 of 32, pO2 75.    Chest x-ray showed no acute cardiopulmonary disease.   EKG: Atrial fibrillation with RVR, heart rate 158.   ASSESSMENT AND PLAN: A 81 year old male who presents with severe sepsis.  1.  Severe sepsis with atrial fibrillation/rapid ventricular response, beats of ventricular tachycardia, elevated white blood cell count, fever of 105. So far his chest x-ray shows no evidence of a pneumonia. He is negative for influenza A and B. His urinalysis is negative. My only suspicion is he had recent hip surgery, so he may have a blood stream infection or possible infection related to his hip, but this is not for certain. I have ordered some blood cultures and we will continue to monitor. The fact that he has been doing well in physical therapy and not complaining of any hip pain makes me less believe that it could be from his hip surgery, but with blood cultures, we may be able to establish the reason for his severe sepsis.  2.  Ventricular tachycardia with atrial fibrillation and rapid ventricular response. The patient had episodes of ventricular tachycardia. Cardiology has been consulted. He is an amiodarone drip, which we will continue. We will continue telemetry monitoring. We will also go ahead and order an echocardiogram to evaluate his cardiac function.  3.  Hypomagnesemia and phosphorus. We will replete. Recheck in the a.m.  4.  Lower extremity edema. This seems to be chronic in nature; however, the wife does say the right leg looks more swollen, so we will order Dopplers to evaluate for deep vein thrombosis.  5.  Essential hypertension. We will obtain complete medication list and restart blood pressure medications pending blood pressure is stable.  6.  History of benign prostatic hypertrophy. The patient is on Flomax which was recently started, which we will continue.  7.  The patient is a FULL CODE STATUS.   CRITICAL CARE TIME SPENT: 65 minutes.    ____________________________ Janyth ContesSital P. Juliene PinaMody,  MD spm:ts D: 06/26/2014 19:44:36 ET T: 06/26/2014 20:44:52 ET JOB#: [id number]  cc: Hendryx Ricke P. Juliene PinaMody, MD, <Dictator> Janyth ContesSITAL P Danica Camarena MD ELECTRONICALLY SIGNED 06/27/2014 15:38

## 2014-12-04 NOTE — H&P (Signed)
PATIENT NAME:  Ronald Good, Ronald Good MR#:  [id number] DATE OF BIRTH:  Feb 26, 1934  DATE OF ADMISSION:  03/20/2014  ADDENDUM:  ALLERGIES:  HYDROCODONE, NEOSPORIN, OXYCODONE.   HOME MEDICATION LIST:  The patient's home medication list is incomplete. Please follow up complete medication reconciliation list.   CURRENT MEDICATION LIST:  1.  Coumadin 4 mg p.o. daily. 2.  Oxybutynin 5 mg 1 tablet b.i.d. 3.  Lopressor 25 mg p.o. daily.  4.  Magnesium hydroxide 8% oral suspension 30 mL twice a day p.r.n. for constipation. 5.  Hydrochlorothiazide  25 mg p.o. daily,  6.  Flomax 0.4 mg p.o. daily. 7.  Percocet 325/5 mg p.o. every 6 hours p.r.n.  8.  Tylenol 500 mg 1 tab p.o. p.r.n.    ____________________________ Shaune PollackQing Essense Bousquet, MD qc:ts D: 03/20/2014 19:06:15 ET T: 03/20/2014 19:30:23 ET JOB#: [id number]  cc: Shaune PollackQing Stephany Poorman, MD, <Dictator> Shaune PollackQING Sorina Derrig MD ELECTRONICALLY SIGNED 03/21/2014 15:45

## 2014-12-04 NOTE — Consult Note (Signed)
CHIEF COMPLAINT and HISTORY:  Subjective/Chief Complaint fevere/sepsis   History of Present Illness Patient is [name] with a history of PAD s/p previous intervention.  This was last checked earlier this year.  He is admitted two days ago with very high fever >105.  Unclear source.  Started on Abx and has defervesced but remains on pressors for hypotension.  Right leg was very painful and swollen, better since admission.  Wife reports blisters/ulcers on both calves she has been treating at home for about 5 weeks.  He had a DVT study yesterday that was negative.  We are asked to assess his legs.   PAST MEDICAL/SURGICAL HISTORY:  Past Medical History:   Atrial Fibrillation:    Hypercholesterolemia:    PVD - Peripheral Vascular Disease:    Pericarditis:    BLE edema:    Hypertension:    Abdomianl auortic aneurism:    stents in legs:    vein graft due to trauma (cut) right wrist (early 1970's):    Knee Surgery - Left: 11-Jun-2013   Left Total Knee Replacement:    Wrist:    Hernia:    Gall bladder:    Right TKR:    Left knee:   ALLERGIES:  Allergies:  Neosporin: Rash  Oxycodone: Agitation  Hydrocodone: Agitation  HOME MEDICATIONS:  Home Medications: Medication Instructions Status  calcium-vitamin D 500 mg-200 intl units oral tablet 1 tab(s) orally 2 times a day (with meals) Active  hydrochlorothiazide 25 mg oral tablet 1 tab(s) orally once a day Active  metoprolol succinate 25 mg oral tablet, extended release 1 tab(s) orally once a day (in the morning) Active  acetaminophen 500 mg oral tablet 1 tab(s) orally once a day (at bedtime), as needed for pain. Active  tamsulosin 0.4 mg oral capsule 1 cap(s) orally once a day Active  traMADol 50 mg oral tablet 1 tab(s) orally every 6 hours, As Needed - for Pain Active  Pradaxa 150 mg oral capsule 1 cap(s) orally 2 times a day (8am, 5pm) Active  Eucerin - topical cream Apply liberal amounts topically to both legs once a  day and as needed. Active  Vitamin D3 2000 intl units oral capsule 1 cap(s) orally once a day Active  Myrbetriq 25 mg oral tablet, extended release 1 tab(s) orally once a day Active   Family and Social History:  Family History Non-Contributory   Social History negative tobacco, negative ETOH, negative Illicit drugs   Place of Living Home   Review of Systems:  Subjective/Chief Complaint No TIA/stroke/seizure No heat or cold intolerance No dysuria/hematuria No blurry or double vision No tinnitus or ear pain No rashes.  LE ulcers as above. Leg swelling and pain as above   Fever/Chills Yes   Cough No   Sputum No   Abdominal Pain No   Diarrhea No   Constipation No   Nausea/Vomiting No   SOB/DOE No   Chest Pain No   Telemetry Reviewed Afib   Dysuria No   Tolerating PT Yes   Tolerating Diet Yes   Medications/Allergies Reviewed Medications/Allergies reviewed   Physical Exam:  GEN well developed, well nourished   HEENT pink conjunctivae, moist oral mucosa   NECK No masses  trachea midline   RESP normal resp effort  no use of accessory muscles   CARD irregular rate  no JVD   VASCULAR ACCESS none   ABD denies tenderness  soft   GU foley catheter in place  clear yellow urine draining  LYMPH negative neck, negative axillae   EXTR negative cyanosis/clubbing, positive edema, stasis dermatitis present bilaterally, R>L.  1-2+ RLE swelling, 1+ LLE swelling.  Small ulcers without tenderness or drainage on each calf. pulses 2+ right PT, 1+ right DP, 1-2+ left PT, 1+ left DP   SKIN positive ulcers, skin turgor good, as above.  Small, clean appearing   NEURO cranial nerves intact, motor/sensory function intact   PSYCH alert, A+O to time, place, person   LABS:  Laboratory Results: LabObservation:    15-Nov-15 10:38, [place] Color Flow Doppler Low Extrem Bilat (Legs)  OBSERVATION   Reason for Test Swelling    15-Nov-15 12:24, Echo Doppler  OBSERVATION    Reason for Test  Hepatic:    14-Nov-15 18:10, Comprehensive Metabolic Panel  Bilirubin, Total 1.0  Alkaline Phosphatase 100  46-116  NOTE: New Reference Range  03/02/14  SGPT (ALT) 24  14-63  NOTE: New Reference Range  03/02/14  SGOT (AST) 28  Total Protein, Serum 7.7  Albumin, Serum 3.3    15-Nov-15 06:01, Comprehensive Metabolic Panel  Bilirubin, Total 1.5  Alkaline Phosphatase 84  46-116  NOTE: New Reference Range  03/02/14  SGPT (ALT) 27  14-63  NOTE: New Reference Range  03/02/14  SGOT (AST) 41  Total Protein, Serum 6.1  Albumin, Serum 2.5  TDMs:    16-Nov-15 10:45, Vancomycin, Trough LAB  Vancomycin, Trough LAB 23  Routine Micro:    14-Nov-15 18:10, Blood Culture  Micro Text Report   BLOOD CULTURE    COMMENT                   NO GROWTH IN 18-24 HOURS     ANTIBIOTIC  Culture Comment   NO GROWTH IN 18-24 HOURS   Result(s) reported on 27 Jun 2014 at 06:00PM.    14-Nov-15 18:13, Blood Culture  Micro Text Report   BLOOD CULTURE    COMMENT                   NO GROWTH IN 18-24 HOURS     ANTIBIOTIC  Culture Comment   NO GROWTH IN 18-24 HOURS   Result(s) reported on 27 Jun 2014 at 06:00PM.    14-Nov-15 18:23, ED Influenza Test [facility])  Micro Text Report   ANTIBIOTIC    14-Nov-15 18:23, Influenza A + B Antigen ([facility])  Micro Text Report   INFLUENZA A+B ANTIGENS    COMMENT                   NEGATIVE FOR INFLUENZA A (ANTIGEN ABSENT)    COMMENT                   NEGATIVE FOR INFLUENZA B (ANTIGEN ABSENT)     ANTIBIOTIC  Comment 1..   NEGATIVE FOR INFLUENZA A (ANTIGEN ABSENT) A negative result does not exclude influenza.  Correlation with clinical impression is required.  Comment 2..   NEGATIVE FOR INFLUENZA B (ANTIGEN ABSENT)   Result(s) reported on 26 Jun 2014 at 07:27PM.    14-Nov-15 18:23, Urine Culture  Micro Text Report   URINE CULTURE    COMMENT                   NO GROWTH IN 36 HOURS     ANTIBIOTIC  Specimen Source   CLEAN CATCH  Culture  Comment   NO GROWTH IN 36 HOURS   Result(s) reported on 28 Jun 2014 at 10:17AM.  Lab:  14-Nov-15 18:25, ABG and Lactic Acid  pH (ABG) 7.520  7.350-7.450  NOTE: New Reference Range  03/06/14  PCO2 32  32-48  NOTE: New Reference Range  03/23/14  PO2 75  83-108  NOTE: New Reference Range  03/06/14  FiO2 26  Base Excess 4  -3-3  NOTE: New Reference Range  03/23/14  HCO3 26.1  21.0-28.0  NOTE: New Reference Range  03/06/14  Specimen Site (ABG)   RT RADIAL  Specimen Type (ABG) ARTERIAL  Result(s) reported on 26 Jun 2014 at 06:33PM.    15-Nov-15 03:20, ABG  pH (ABG) 7.380  7.350-7.450  NOTE: New Reference Range  03/06/14  PCO2 23  32-48  NOTE: New Reference Range  03/23/14  PO2 117  83-108  NOTE: New Reference Range  03/06/14  FiO2 36  Base Excess -10  -3-3  NOTE: New Reference Range  03/23/14  HCO3 13.6  21.0-28.0  NOTE: New Reference Range  03/06/14  O2 Saturation 97.8  Specimen Site (ABG)   LT RADIAL  Specimen Type (ABG) ARTERIAL  Patient Temp (ABG) 37.0  Result(s) reported on 27 Jun 2014 at 03:29AM.  Cardiology:    14-Nov-15 06:07, ED ECG  Ventricular Rate 158  Atrial Rate 187  QRS Duration 112  QT 278  QTc 450  R Axis 83  T Axis -17  ECG interpretation   Atrial fibrillation with rapid ventricular response  Incomplete right bundle branch block  Marked ST abnormality, possible inferior subendocardial injury  Abnormal ECG  When compared with ECG of 20-Mar-2014 16:57,  ST now depressed in Inferior leads  Nonspecific T wave abnormality now evident in Inferior leads  ----------unconfirmed----------  Confirmed by OVERREAD, NOT ([id number]), editor PEARSON, BARBARA ([id number]) on 06/28/2014 2:35:03 PM  ED ECG     15-Nov-15 12:24, Echo Doppler  Echo Doppler   REASON FOR EXAM:      COMMENTS:       PROCEDURE: [facility] - ECHO DOPPLER COMPLETE(TRANSTHOR)  - Jun 27 2014 12:24PM     RESULT: Echocardiogram Report    Patient Name:   Ronald Good Date of Exam:  06/27/2014  Medical Rec #:  [id number]             Custom1:  Date of Birth:  Feb 13, 1934          Height:  Patient Age:    37 years           Weight:  Patient Gender: M                  BSA:    Indications: Atrial Fib  Sonographer:    Janalee Dane RCS  Referring Phys: MODY, SITAL, P    Sonographer Comments: Technically difficult study due to poor echo   windows.    Summary:   1. Left ventricular ejection fraction, by visual estimation, is 30 to   35%.   2. Moderately to severely decreased global left ventricular systolic   function.   3. Moderately reduced RV systolic function.   4. Severely enlarged right ventricle.   5. Moderately dilated left atrium.   6. Moderately dilated right atrium.   7. Mild to moderate mitral valve regurgitation.   8. Mild to moderate tricuspid regurgitation.  2D AND M-MODE MEASUREMENTS (normal ranges within parentheses):  Left Ventricle:          Normal  IVSd (2D):      0.87 cm (0.7-1.1)  LVPWd (2D):     0.79 cm (0.7-1.1) Aorta/LA:  Normal  LVIDd (2D):     4.40 cm (3.4-5.7) Aortic Root (2D): 3.70 cm (2.4-3.7)  LVIDs (2D):     3.75 cm           Left Atrium (2D): 5.00 cm (1.9-4.0)  LV FS (2D):     14.8 %   (>25%)  LV EF (2D):     31.6 %   (>50%)                                    Right Ventricle:                                    RVd (2D):        6.16 cm  SPECTRAL DOPPLER ANALYSIS (where applicable):  Tricuspid Valve and PA/RV Systolic Pressure: TR Max Velocity: 1.85 m/s RA   Pressure: 15 mmHg RVSP/PASP: 28.7 mmHg  PHYSICIAN INTERPRETATION:  Left Ventricle: Not well seen. The left ventricular internal cavity size   was normal. LV septal wall thickness was normal. LV posterior wall   thickness was normal. Global LV systolic function was moderately to   severely decreased. Left ventricular ejection fraction, by visual   estimation, is 30 to 35%.  Right Ventricle: The right ventricular size is severely enlarged. Global   RV systolic  function is moderately reduced.  Left Atrium: The left atrium is moderately dilated.  Right Atrium: The right atrium is moderately dilated.  Pericardium: Thereis no evidence of pericardial effusion.  Mitral Valve: The mitral valve is normal in structure. Mild to moderate   mitral valve regurgitation is seen.  Tricuspid Valve: The tricuspid valve is normal. Mild to moderate   tricuspid regurgitation is visualized. The tricuspid regurgitant velocity     is 1.85 m/s, and with an assumed right atrial pressure of 15 mmHg, the   estimated right ventricular systolic pressure is normal at 28.7 mmHg.  Aortic Valve: The aortic valve is normal. Trivial aortic valve   regurgitation is seen.  Pulmonic Valve: The pulmonic valve is normal. No indication of pulmonic   valve regurgitation.    [place] MD  Electronically signed by Darby Lujean Amel MD  Signature Date/Time: 06/28/2014/8:59:20 AM    ***Final ***    IMPRESSION: .    Verified By: Yolonda Kida, M.D., MD  Routine Chem:    14-Nov-15 18:10, Comprehensive Metabolic Panel  Glucose, Serum 107  BUN 11  Creatinine (comp) 0.87  Sodium, Serum 138  Potassium, Serum 4.0  Chloride, Serum 103  CO2, Serum 26  Calcium (Total), Serum 8.2  Osmolality (calc) 276  eGFR (African American) >60  eGFR (Non-African American) >60  eGFR values <87mL/min/1.73 m2 may be an indication of chronic  kidney disease (CKD).  Calculated eGFR, using the MRDR Study equation, is useful in   patients with stable renal function.  The eGFR calculation will not be reliable in acutely ill patients  when serum creatinine is changing rapidly. It is not useful in  patients on dialysis. The eGFR calculation may not be applicable  to patients at the low and high extremes of body sizes, pregnant  women, and vegetarians.  Anion Gap 9    14-Nov-15 18:10, Magnesium, Serum  Magnesium, Serum 1.3  1.8-2.4  THERAPEUTIC RANGE: 4-7 mg/dL  TOXIC: > 10 mg/dL    -----------------------  14-Nov-15 18:10, Phosphorus, Serum  Phosphorus, Serum 1.7  Result(s) reported on 26 Jun 2014 at 06:59PM.    14-Nov-15 18:23, ED Influenza Test [facility])  Result Comment   FLU - WRONG TEST ORDERED. REORDERED CORRECT   - TEST    15-Nov-15 06:01, Comprehensive Metabolic Panel  Glucose, Serum 96  BUN 14  Creatinine (comp) 1.59  Sodium, Serum 142  Potassium, Serum 3.5  Chloride, Serum 108  CO2, Serum 20  Calcium (Total), Serum 7.5  Osmolality (calc) 283  eGFR (African American) 55  eGFR (Non-African American) 45  eGFR values <11mL/min/1.73 m2 may be an indication of chronic  kidney disease (CKD).  Calculated eGFR, using the MRDR Study equation, is useful in   patients with stable renal function.  The eGFR calculation will not be reliable in acutely ill patients  when serum creatinine is changing rapidly. It is not useful in  patients on dialysis. The eGFR calculation may not be applicable  to patients at the low and high extremes of body sizes, pregnant  women, and vegetarians.  Anion Gap 14    15-Nov-15 06:01, Magnesium, Serum  Magnesium, Serum 1.7  1.8-2.4  THERAPEUTIC RANGE: 4-7 mg/dL  TOXIC: > 10 mg/dL   -----------------------    15-Nov-15 06:01, Phosphorus, Serum  Phosphorus, Serum 2.5  Result(s) reported on 27 Jun 2014 at 06:48AM.    15-Nov-15 15:23, Magnesium, Serum  Result Comment   LABS - This specimen was collected through an    - indwelling catheter or arterial line.   - A minimum of 25mls of blood was wasted prior     - to collecting the sample.  Interpret   - results with caution.   Result(s) reported on 27 Jun 2014 at 03:53PM.  Magnesium, Serum 2.1  1.8-2.4  THERAPEUTIC RANGE: 4-7 mg/dL  TOXIC: > 10 mg/dL   -----------------------    16-Nov-15 10:45, Vancomycin, Trough LAB  Result Comment   VANC TROUGH - RESULTS VERIFIED BY REPEAT TESTING.   - NOTIFIED OF CRITICAL VALUE   - READ-BACK PROCESS PERFORMED.   Aura Camps JAMES  06/28/14 @ 1146.Marland KitchenMarland KitchenMTV   Result(s) reported on 28 Jun 2014 at 11:50AM.  Cardiac:    14-Nov-15 18:10, Troponin I  Troponin I 0.05  0.00-0.05  0.05 ng/mL or less: NEGATIVE   Repeat testing in 3-6 hrs   if clinically indicated.  >0.05 ng/mL: POTENTIAL   MYOCARDIAL INJURY. Repeat   testing in 3-6 hrs if   clinically indicated.  NOTE: An increase or decrease   of 30% or more on serial   testing suggests a   clinically important change  Routine UA:    14-Nov-15 18:23, Urinalysis  Color (UA) Yellow  Clarity (UA) Hazy  Glucose (UA) Negative  Bilirubin (UA) Negative  Ketones (UA) Negative  Specific Gravity (UA) 1.011  Blood (UA) 3+  pH (UA) 9.0  Protein (UA) Negative  Nitrite (UA) Negative  Leukocyte Esterase (UA) Negative  Result(s) reported on 26 Jun 2014 at 06:46PM.  RBC (UA) 794 /HPF  WBC (UA) 1 /HPF  Bacteria (UA)   NONE SEEN  Epithelial Cells (UA)   NONE SEEN   Result(s) reported on 26 Jun 2014 at 06:46PM.  Routine Coag:    14-Nov-15 18:10, Prothrombin Time  Prothrombin 16.4  INR 1.3  INR reference interval applies to patients on anticoagulant therapy.  A single INR therapeutic range for coumarins is not optimal for all  indications; however, the suggested range for most indications is  2.0 - 3.0.  Exceptions to the INR Reference Range may include: Prosthetic heart  valves, acute myocardial infarction, prevention of myocardial  infarction, and combinations of aspirin and anticoagulant. The need  for a higher or lower target INR must be assessed individually.  Reference: The Pharmacology and Management of the Vitamin K   antagonists: the seventh ACCP Conference on Antithrombotic and  Thrombolytic Therapy. VOZDG.6440 Sept:126 (3suppl): [id number].  A HCT value >55% may artifactually increase the PT.  In one study,   the increase was an average of 25%.  Reference:  "Effect on Routine and Special Coagulation Testing Values  of Citrate Anticoagulant Adjustment in  Patients with High HCT Values."  American Journal of Clinical Pathology 2006;126:400-405.  Routine Hem:    14-Nov-15 18:10, CBC Profile  WBC (CBC) 12.3  RBC (CBC) 4.24  Hemoglobin (CBC) 14.8  Hematocrit (CBC) 43.6  Platelet Count (CBC) 127  MCV 103  MCH 34.9  MCHC 33.9  RDW 14.2  Neutrophil % 87.8  Lymphocyte % 8.0  Monocyte % 3.3  Eosinophil % 0.5  Basophil % 0.4  Neutrophil # 10.8  Lymphocyte # 1.0  Monocyte # 0.4  Eosinophil # 0.1  Basophil # 0.0  Result(s) reported on 26 Jun 2014 at 06:23PM.    15-Nov-15 06:01, CBC Profile  WBC (CBC) 26.7  RBC (CBC) 4.00  Hemoglobin (CBC) 13.5  Hematocrit (CBC) 43.1  Platelet Count (CBC) 98  MCV 108  MCH 33.9  MCHC 31.4  RDW 14.7  Neutrophil % 86.8  Lymphocyte % 6.3  Monocyte % 6.6  Eosinophil % 0.0  Basophil % 0.3  Neutrophil # 23.2  Lymphocyte # 1.7  Monocyte # 1.8  Eosinophil # 0.0  Basophil # 0.1  Result(s) reported on 27 Jun 2014 at 06:43AM.   RADIOLOGY:  Radiology Results: XRay:    05-May-15 11:49, Wrist Right Complete  Wrist Right Complete  REASON FOR EXAM:    swelling, trauma, pain  COMMENTS:   LMP: (Male)    PROCEDURE: DXR - DXR WRIST RT COMP WITH OBLIQUES  - Dec 15 2013 11:49AM     CLINICAL DATA:  [place].  Wrist pain.    EXAM:  RIGHT WRIST - COMPLETE 3+ VIEW    COMPARISON:  None.    FINDINGS:  There are advanced degenerative changes involving the wrist along  with chondrocalcinosis. Findings could suggest CPPD arthropathy.  There also advanced degenerative changes at the carpometacarpal  joint of the thumb. No acute fractures identified. Extensive  vascular calcifications.     IMPRESSION:  Advanced degenerative changes but no definite acute fracture.      Electronically Signed    By: Kalman Jewels M.D.    On: 12/15/2013 12:04         Verified By: Marlane Hatcher, M.D.,    05-May-15 12:39, Lumbar Spine AP and Lateral  Lumbar Spine AP and Lateral  REASON FOR EXAM:    FALL, LOW BACK  PAIN  COMMENTS:       PROCEDURE: DXR - DXR LUMBAR SPINE AP AND LATERAL  - Dec 15 2013 12:39PM     CLINICAL DATA:  Fall.  Back pain    EXAM:  LUMBAR SPINE - 2-3 VIEW    COMPARISON:  None.    FINDINGS:  Negative for fracture. Grade 1 anterior slip L5-S1. Disc  degeneration and spondylosis throughout the lumbar spine.  Aortic iliac stent graft in satisfactory position.     IMPRESSION:  Lumbar degenerative changes and spondylosis.  Negative for fracture.  Electronically Signed    By: Marlan Palau M.D.    On: 12/15/2013 12:55         Verified By: Camelia Phenes, M.D.,    05-May-15 12:39, Ribs Right Unilateral  Ribs Right Unilateral  REASON FOR EXAM:    RIGHT RIB PAIN, FALL  COMMENTS:       PROCEDURE: DXR - DXR RIBS RIGHT UNILATERAL  - Dec 15 2013 12:39PM     CLINICAL DATA:  Fall, right rib pain.    EXAM:  RIGHT RIBS - 2 VIEW    COMPARISON:  None.    FINDINGS:  There is hyperinflation of the lungs compatible with COPD. Lungs are  clear. No effusions or pneumothorax. No visible right rib fracture.   IMPRESSION:  COPD.  No acute findings.  No visible right rib fracture      Electronically Signed    By: Charlett Nose M.D.    On: 12/15/2013 12:51         Verified By: Cyndie Chime, M.D.,    08-Aug-15 16:47, Femur Left  Femur Left  REASON FOR EXAM:    lower femur pain following trauma, ok to cancel if   left hip XR demonstrates frac  COMMENTS:       PROCEDURE: DXR - DXR FEMUR LEFT  - Mar 20 2014  4:47PM     CLINICAL DATA:  Trauma, left femur pain    EXAM:  LEFT FEMUR - 2 VIEW    COMPARISON:  None.    FINDINGS:  No fracture or dislocation is seen.  The joint spaces are preserved.    Left knee arthroplasty.    Vascular calcifications.     IMPRESSION:  No fracture or dislocation is seen.    Left knee arthroplasty.      Electronically Signed    By: Charline Bills M.D.    On: 03/20/2014 17:02     Verified By: Charline Bills, M.D.,     08-Aug-15 16:47, Hip Left Complete  Hip Left Complete  REASON FOR EXAM:    pain following trauma  COMMENTS:       PROCEDURE: DXR - DXR HIP LEFT COMPLETE  - Mar 20 2014  4:47PM     CLINICAL DATA:  Left hip injury and pain.    EXAM:  LEFT HIP - COMPLETE 2+ VIEW    COMPARISON:  03/20/2011 CT    FINDINGS:  There is no evidence of hip fracture or dislocation. There is no  evidence of arthropathy or other focal bone abnormality. Diffuse  osteopenia is noted. If there is strong clinical suspicion for hip  fracture, consider MRI.     IMPRESSION:  No evidenceof hip fracture in this osteopenic patient. If there is  strong clinical suspicion for hip fracture, consider MRI for further  evaluation.      Electronically Signed    By: Laveda Abbe M.D.    On: 03/20/2014 17:09         Verified By: Rosendo Gros, M.D.,    08-Aug-15 18:20, Chest Portable Single View  Chest Portable Single View  REASON FOR EXAM:    pain after trauma  COMMENTS:       PROCEDURE: DXR - DXR PORTABLE CHEST SINGLE VIEW  - Mar 20 2014  6:20PM     CLINICAL DATA:  Left hip fracture, preoperative image    EXAM:  PORTABLE CHEST - 1 VIEW    COMPARISON:  12/15/2013  FINDINGS:  Mild cardiac enlargement stable. Mild vascular congestion similar to  prior study. Mild interstitial prominence. No consolidation or  effusion.     IMPRESSION:  Cardiac enlargement and vascular congestion with borderline features  possibly indicating minimal interstitial pulmonary edema.      Electronically Signed    By: Skipper Cliche M.D.    On: 03/20/2014 18:24         Verified By: Rachael Fee, M.D.,    09-Aug-15 20:48, Hip Left One View  Hip Left One View  REASON FOR EXAM:    Fracture reduction/fixation  COMMENTS:       PROCEDURE: DXR - DXR HIP LEFT ONE VIEW  - Mar 21 2014  8:48PM     CLINICAL DATA:  Left hip fracture.    EXAM:  LEFT HIP - 1 VIEW:    COMPARISON:  CT scan  03/20/2014.    FINDINGS:  Thereare four cannulated hip screws transfixing the subcapital  fracture. Good position and alignment without complicating features.   IMPRESSION:  Internal fixation of the subcapital hip fracture.      Electronically Signed    By: Kalman Jewels M.D.  On: 03/21/2014 21:01         Verified By: Marlane Hatcher, M.D.,    13-Aug-15 11:47, Cervical Spine Complete  Cervical Spine Complete  REASON FOR EXAM:    Pain in neck/ limited ROM  COMMENTS:       PROCEDURE: DXR - DXR CERVICAL SPINE COMPLETE  - Mar 25 2014 11:47AM     CLINICAL DATA:  Neck pain.    EXAM:  CERVICAL SPINE  4+ VIEWS    COMPARISON:  None.    FINDINGS:  Diffuse spondylosis of the cervical spine noted which is most severe  at C5-6. No associated subluxation or fracture. Oblique views show  diffuse component of bony foraminal stenosis bilaterally at nearly  every level. No soft tissue swelling is identified. No bony lesions  or destruction identified.     IMPRESSION:  Diffuse spondylosis of the cervical spine, most severely at C5-6. No  fracture or subluxation is identified.      Electronically Signed    By: Aletta Edouard M.D.    On: 03/25/2014 14:02         Verified By: Azzie Roup, M.D.,    [phone] 18:36, Chest Portable Single View  Chest Portable Single View  REASON FOR EXAM:    Sepsis  COMMENTS:       PROCEDURE: DXR - DXR PORTABLE CHEST SINGLE VIEW  - Jun 26 2014  6:36PM     CLINICAL DATA:  Fever tachycardia cough initial evaluation, personal  history of atrial fibrillation and hypertension    EXAM:  PORTABLE CHEST - 1 VIEW    COMPARISON:  03/20/2014    FINDINGS:  Moderate cardiac enlargement similar to prior study. Numerous  devices overlie the thorax limiting evaluation.    Similar the prior study there is mild vascular congestion and mild  diffuseinterstitial prominence. No consolidation or effusion.     IMPRESSION:  Mild cardiogenic  interstitial pulmonary edema similar to prior  study.      Electronically Signed    By: Skipper Cliche M.D.    On: 06/26/2014 18:56       Verified By: Rachael Fee, M.D.,    [phone] 20:20, Hip Left Complete  Hip Left Complete  REASON FOR EXAM:    infection sepsis  COMMENTS:  PROCEDURE: DXR - DXR HIP LEFT COMPLETE  - Jun 26 2014  8:20PM     CLINICAL DATA:  High fever, left hip pain    EXAM:  LEFT HIP - COMPLETE 2+ VIEW    COMPARISON:  None.    FINDINGS:  Four cannulated left femoral neck screws transfixing a healed left  femoral neck fracture without failure or complication. No acute  fracture or dislocation. Generalized osteopenia. Degenerative  changes of bilateral SI joints and lower lumbar spine.    Aorto bi-iliac stent graft noted. There is peripheral vascular  atherosclerotic disease.     IMPRESSION:  No acute osseous injury of the left hip.      Electronically Signed    By: Kathreen Devoid    On: 06/26/2014 21:01       Verified By: Jennette Banker, M.D., MD    15-Nov-15 11:50, Chest Portable Single View  Chest Portable Single View  REASON FOR EXAM:    PICC line placement  COMMENTS:       PROCEDURE: DXR - DXR PORTABLE CHEST SINGLE VIEW  - Jun 27 2014 11:50AM     CLINICAL DATA:  81 year old male status post placement of right  upper extremity PICC    EXAM:  PORTABLE CHEST - 1 VIEW    COMPARISON:  A prior chest x-ray 06/26/2014    FINDINGS:  New right upper extremity PICC. The catheter tip projects over the  superior cavoatrial junction in good position. Stable cardiomegaly.  Pulmonary vascular congestion with mild interstitial edema is  similar to incrementally improved compared to yesterday.  Atherosclerotic calcifications present in the transverse aorta. The  external defibrillator pads have been removed. Mild bibasilar  subsegmental atelectasis. No acute osseous abnormality.     IMPRESSION:  The tip of the new right upper extremity  approach PICC projects over  the superior cavoatrial junction.    Stable to slightly improved pulmonary vascular congestion and mild  interstitial edema.    Electronically Signed    By: Jacqulynn Cadet M.D.    On: 06/27/2014 12:49         Verified By: Criselda Peaches, M.D.,  [place]:    15-Nov-15 10:38, [place] Color Flow Doppler Low Extrem Bilat (Legs)  [place] Color Flow Doppler Low Extrem Bilat (Legs)  REASON FOR EXAM:    Swelling  COMMENTS:       PROCEDURE: [place]  - US DOPPLER LOW EXTR BILATERAL  - Jun 27 2014 10:38AM     CLINICAL DATA:  81 year old male with bilateral lower extremity  swelling. History of bilateral knee replacements.    EXAM:  BILATERAL LOWER EXTREMITY VENOUS DOPPLER ULTRASOUND    TECHNIQUE:  Gray-scale sonography with graded compression, as well as color  Doppler and duplex ultrasound were performed to evaluate the lower  extremity deep venous systems from the level of the common femoral  vein and including the common femoral, femoral, profunda femoral,  popliteal and calf veins including the posterior tibial, peroneal  and gastrocnemius veins when visible. The superficial great  saphenous vein was also interrogated. Spectral Doppler was utilized  to evaluate flow at rest and with distal augmentation maneuvers in  the common femoral, femoral and popliteal veins.    COMPARISON:  03/30/2013 CT    FINDINGS:  RIGHT LOWER EXTREMITY    Deep venous system appears patent and compressible from groin  through popliteal fossae bilaterally.  Spontaneous venous flow present with evidence of respiratory  phasicity. Augmentation intact.  No intraluminal thrombus identified.    Visualized portions of the greater saphenous veins patent  bilaterally.    Venous Reflux:  Present throughout the deep venous system    Other Findings:  None.    LEFT LOWER EXTREMITY    Deep venous system appears patent and compressible from groin  through popliteal fossae  bilaterally.    Spontaneous venous flow present with evidence of respiratory  phasicity. Augmentation intact.    No intraluminal thrombus identified.    Visualized portions of the greater saphenous veins patent  bilaterally.    Venous Reflux:  Present throughout the deep venous system    Other Findings: A 3.3 x 2 x 5.1 cm complicated heterogeneous  structure in the left popliteal fossa likely represents a  complicated Baker cyst as identified on the 03/30/2013 CT.   IMPRESSION:  No evidence of bilateral lower extremity DVT.    3.3 x 2 x 5.1 cm probable complicated Baker cyst in the left  popliteal fossa.      Electronically Signed    By: Hassan Rowan M.D.    On: 06/27/2014 13:38         Verified By: Lura Em, M.D.,  [place]:    05-May-15 11:21, CT Head Without Contrast  PACS Image    05-May-15 11:21, CT Maxillofacial Area Without Contrast  PACS Image    05-May-15 11:49, Wrist Right Complete  PACS Image    05-May-15 12:39, Lumbar Spine AP and Lateral  PACS Image    05-May-15 12:39, Ribs Right Unilateral  PACS Image    08-Aug-15 16:47, Femur Left  PACS Image    08-Aug-15 16:47, Hip Left Complete  PACS Image    08-Aug-15 17:42, CT Hip Left Without Contrast  PACS Image    08-Aug-15 18:20, Chest Portable Single View  PACS Image    09-Aug-15 20:48, Hip Left One View  PACS Image    13-Aug-15 11:47, Cervical Spine Complete  PACS Image    14-Nov-15 18:36, Chest Portable Single View  PACS Image    14-Nov-15 20:20, Hip Left Complete  PACS Image    15-Nov-15 10:38, [place] Color Flow Doppler Low Extrem Bilat (Legs)  PACS Image    15-Nov-15 11:50, Chest Portable Single View  PACS Image  CT:    05-May-15 11:21, CT Head Without Contrast  CT Head Without Contrast  REASON FOR EXAM:    fall, on coumadin  COMMENTS:   LMP: (Male)    PROCEDURE: CT  - CT HEAD WITHOUT CONTRAST  - Dec 15 2013 11:21AM     CLINICAL DATA:  Fall with blunt trauma to face. Bruising  to  right-sided phase.    EXAM:  CT HEAD WITHOUT CONTRAST    CT MAXILLOFACIAL WITHOUT CONTRAST    TECHNIQUE:  Multidetector CT imaging of the head and maxillofacial structures  were performed using the standard protocol without intravenous  contrast. Multiplanar CT image reconstructions of the maxillofacial  structures were also generated.    COMPARISON:  None.    FINDINGS:  CT HEAD FINDINGS    No intracranial hemorrhage. No parenchymal contusion. No midline  shift or mass effect. Basilar cisterns are patent. No skull base  fracture. No fluid in the paranasal sinuses or mastoid air cells.  Orbits are normal.    The generalized cortical atrophy. Periventricular and white matter  hypodensities.    No fluid in the paranasal sinuses .  Mastoid air cells clear    CT MAXILLOFACIAL FINDINGS  No orbital rim fracture. Intraconal contents are normal. The globes  are normal. The zygomatic arches are intact. Pterygoid plates are  normal. No maxillary fracture. Mandibles on located. No mandibular  fracture.     IMPRESSION:  1. No intracranial trauma.  2. Chronic atrophy and microvascular disease.  3. No evidence of facial bone fracture.  Electronically Signed    By: Suzy Bouchard M.D.    On: 12/15/2013 12:20         Verified By: Rennis Golden, M.D.,    05-May-15 11:21, CT Maxillofacial Area Without Contrast  CT Maxillofacial Area Without Contrast  REASON FOR EXAM:    fall, on coumadin  COMMENTS:   LMP: (Male)    PROCEDURE: CT  - CT MAXILLOFACIAL AREA WO  - Dec 15 2013 11:21AM     CLINICAL DATA:  Fall with blunt trauma to face. Bruising to  right-sided phase.    EXAM:  CT HEAD WITHOUT CONTRAST    CT MAXILLOFACIAL WITHOUT CONTRAST    TECHNIQUE:  Multidetector CT imaging of the head and maxillofacial structures  were performed using the standard protocol without intravenous  contrast. Multiplanar CT image reconstructions of the maxillofacial  structures were  also generated.    COMPARISON:  None.    FINDINGS:  CT HEAD FINDINGS    No intracranial hemorrhage. No parenchymal contusion. No midline  shift or mass effect. Basilar cisterns are patent. No skull base  fracture. No fluid in the paranasal sinuses or mastoid air cells.  Orbits are normal.    The generalized cortical atrophy. Periventricular and white matter  hypodensities.    No fluid in the paranasal sinuses .  Mastoid air cells clear    CT MAXILLOFACIAL FINDINGS    No orbital rim fracture. Intraconal contents are normal. The globes  are normal. The zygomatic arches are intact. Pterygoid plates are  normal. No maxillary fracture. Mandibles on located. No mandibular  fracture.     IMPRESSION:  1. No intracranial trauma.  2. Chronic atrophy and microvascular disease.  3. No evidence of facial bone fracture.  Electronically Signed    By: Suzy Bouchard M.D.    On: 12/15/2013 12:20         Verified By: Rennis Golden, M.D.,    08-Aug-15 17:42, CT Hip Left Without Contrast  CT Hip Left Without Contrast  REASON FOR EXAM:    pain following trauma, normal plain film  COMMENTS:       PROCEDURE: CT  - CT HIP LEFT WITHOUT CONTRAST  - Mar 20 2014  5:42PM     CLINICAL DATA:  [place].  Left hip pain.    EXAM:  CT OF THE LEFT HIP WITHOUT CONTRAST    TECHNIQUE:  Multidetector CT imaging was performed according to the standard  protocol. Multiplanar CT image reconstructions were also generated.    COMPARISON:  Radiographs 03/20/2014  FINDINGS:  There is an impacted subcapital/high femoral neck fracture with  slight posterior displacement of the humeral head.    The left pubic bone is intact. The visualized left sacrum is intact.  No significant intrapelvic abnormalities.     IMPRESSION:  Impacted subcapital/high femoral neck fracture.      Electronically Signed    By: Kalman Jewels M.D.    On: 03/20/2014 17:49     Verified By: Marlane Hatcher, M.D.,    ASSESSMENT AND PLAN:  Assessment/Admission Diagnosis sepsis, unclear etiology LE swelling and chronic wounds.   Possible  cellulitis as a cause of his sepsis, but seems to be improved with ABx for 48  hours as does not appear that erythematous now.  Pulses in feet are palpable, so doubt he has severe arterial insufficiency at this time. has deferversed but still low BP   Plan Would treat LE swelling and ulcers with [employer].  Would change UNNA Boots weekly.  We will be happy to follow in office and change UNNAs, as well as recheck perfusion with duplex in future.   Would consider 7-10 days of Abx for possible cellulitis that has improved with 2 days of IV ABx.   level 4   Electronic Signatures: Algernon Huxley (MD)  (Signed 16-Nov-15 15:00)  Authored: Chief Complaint and History, PAST MEDICAL/SURGICAL HISTORY, ALLERGIES, HOME MEDICATIONS, Family and Social History, Review of Systems, Physical Exam, LABS, RADIOLOGY, Assessment and Plan   Last Updated: 16-Nov-15 15:00 by Algernon Huxley (MD)

## 2014-12-04 NOTE — Consult Note (Signed)
Chief Complaint:  Subjective/Chief Complaint Left hip fracture   VITAL SIGNS/ANCILLARY NOTES: **Vital Signs.:   11-Aug-15 13:55  Vital Signs Type Q 4hr  Temperature Temperature (F) 98.6  Celsius 37  Temperature Source oral  Pulse Pulse 105  Respirations Respirations 16  Systolic BP Systolic BP 161  Diastolic BP (mmHg) Diastolic BP (mmHg) 80  Mean BP 97  Pulse Ox % Pulse Ox % 94  Pulse Ox Activity Level  At rest  Oxygen Delivery Room Air/ 21 %  VITAL SIGNS/ANCILLARY NOTES: Rehab Summary:   11-Aug-15 09:43  Rehab Progress Summary Rehab Progress Summary Physical Therapy:  S: "Is Shirley sitting over there?"  A: Pt. presents with decreased lethergy and increased ability to participate in ther-ex.  Pt. completes ther-ex with supervsion with the exception of SLR and hip abd/add, which require min assist and contact guard assistance respectively.  Pt. requires max assistx2 and handrails with heavy verbal cues to complete supine>sit secondary to pt. fear of pain from L LE and heavy trunk extension during transition.  Pt. was educated on the importance of movement in recovery.  Pt. static/ dynamic sitting balance is good following initial verbal and tactile cues to decrease R trunk lean and position B UE for support.  Pt. is unable to maintain WB status with postioning in sitting and increased exertion noted with supine>sit, therefore sit<>stand deferred for safety.  P: Continue POC to improve strength, balance, and endurance. Attempt sit<>stand as appropriate.  Anticipated Discharge Disposition  SNF/STR   Brief Assessment:  EXTR negative edema   Additional Physical Exam Alert, comfortable.  circulation/sensation/motor function good and dressing dry.  Less pain with range of motion.  Stood but didn't walk.  INR still 1.4   Lab Results:  Routine Chem:  11-Aug-15 06:44   Glucose, Serum 88  BUN 16  Creatinine (comp) 0.99  Sodium, Serum  134  Potassium, Serum 3.5  Chloride, Serum  95   CO2, Serum 29  Calcium (Total), Serum  8.1  Anion Gap 10  Osmolality (calc) 269  eGFR (African American) >60  eGFR (Non-African American) >60 (eGFR values <1mL/min/1.73 m2 may be an indication of chronic kidney disease (CKD). Calculated eGFR is useful in patients with stable renal function. The eGFR calculation will not be reliable in acutely ill patients when serum creatinine is changing rapidly. It is not useful in  patients on dialysis. The eGFR calculation may not be applicable to patients at the low and high extremes of body sizes, pregnant women, and vegetarians.)  Routine Coag:  11-Aug-15 06:44   Prothrombin  16.5  INR 1.4 (INR reference interval applies to patients on anticoagulant therapy. A single INR therapeutic range for coumarins is not optimal for all indications; however, the suggested range for most indications is 2.0 - 3.0. Exceptions to the INR Reference Range may include: Prosthetic heart valves, acute myocardial infarction, prevention of myocardial infarction, and combinations of aspirin and anticoagulant. The need for a higher or lower target INR must be assessed individually. Reference: The Pharmacology and Management of the Vitamin K  antagonists: the seventh ACCP Conference on Antithrombotic and Thrombolytic Therapy. WRUEA.5409 Sept:126 (3suppl): N9146842. A HCT value >55% may artifactually increase the PT.  In one study,  the increase was an average of 25%. Reference:  "Effect on Routine and Special Coagulation Testing Values of Citrate Anticoagulant Adjustment in Patients with High HCT Values." American Journal of Clinical Pathology 2006;126:400-405.)  Routine Hem:  11-Aug-15 06:44   WBC (CBC) 9.8  RBC (CBC)  3.98  Hemoglobin (CBC) 14.1  Hematocrit (CBC) 42.0  Platelet Count (CBC)  86  MCV  105  MCH  35.5  MCHC 33.7  RDW  15.2  Neutrophil % 60.2  Lymphocyte % 18.5  Monocyte % 13.8  Eosinophil % 6.8  Basophil % 0.7  Neutrophil # 5.9   Lymphocyte # 1.8  Monocyte #  1.3  Eosinophil # 0.7  Basophil # 0.1 (Result(s) reported on 23 Mar 2014 at 07:11AM.)   Assessment/Plan:  Assessment/Plan:  Assessment Satisfactory progress post hip pinning   Plan May go to skilled nursing facility any time Orthopaedically Remain touch down weight bearing only for 6 weeks return to clinic 2 weeks   Electronic Signatures: Park Breed (MD)  (Signed 11-Aug-15 14:36)  Authored: Chief Complaint, VITAL SIGNS/ANCILLARY NOTES, Brief Assessment, Lab Results, Assessment/Plan   Last Updated: 11-Aug-15 14:36 by Park Breed (MD)

## 2014-12-04 NOTE — Consult Note (Signed)
Chief Complaint:  Subjective/Chief Complaint Covering for Dr. Gustavo Lah. Feels fine this AM. CP and abdominal pain usually at nights.   VITAL SIGNS/ANCILLARY NOTES: **Vital Signs.:   19-Dec-15 06:00  Vital Signs Type Routine  Temperature Temperature (F) 98  Celsius 36.6  Temperature Source oral  Pulse Pulse 78  Respirations Respirations 20  Systolic BP Systolic BP 333  Diastolic BP (mmHg) Diastolic BP (mmHg) 73  Mean BP 87  Pulse Ox % Pulse Ox % 96  Pulse Ox Activity Level  At rest  Oxygen Delivery Room Air/ 21 %   Brief Assessment:  GEN no acute distress   Cardiac Regular   Respiratory clear BS   Gastrointestinal Normal   Lab Results: Hepatic:  19-Dec-15 06:11   Bilirubin, Total 0.6  Alkaline Phosphatase 85 (46-116 NOTE: New Reference Range 03/02/14)  SGPT (ALT) 14 (14-63 NOTE: New Reference Range 03/02/14)  SGOT (AST) 23  Total Protein, Serum 6.7  Albumin, Serum  2.4  Routine Chem:  19-Dec-15 06:11   Glucose, Serum 89  BUN 12  Creatinine (comp) 0.95  Sodium, Serum 140  Potassium, Serum 3.6  Chloride, Serum 106  CO2, Serum 26  Calcium (Total), Serum 8.5  Osmolality (calc) 279  eGFR (African American) >60  eGFR (Non-African American) >60 (eGFR values <16mL/min/1.73 m2 may be an indication of chronic kidney disease (CKD). Calculated eGFR, using the MRDR Study equation, is useful in  patients with stable renal function. The eGFR calculation will not be reliable in acutely ill patients when serum creatinine is changing rapidly. It is not useful in patients on dialysis. The eGFR calculation may not be applicable to patients at the low and high extremes of body sizes, pregnant women, and vegetarians.)  Anion Gap 8  Routine Hem:  19-Dec-15 06:11   WBC (CBC) 9.2  RBC (CBC)  3.65  Hemoglobin (CBC)  12.4  Hematocrit (CBC)  37.2  Platelet Count (CBC) 153  MCV  102  MCH 33.9  MCHC 33.3  RDW  14.6  Neutrophil % 59.4  Lymphocyte % 24.7  Monocyte % 10.3   Eosinophil % 4.5  Basophil % 1.1  Neutrophil # 5.5  Lymphocyte # 2.3  Monocyte # 0.9  Eosinophil # 0.4  Basophil # 0.1 (Result(s) reported on 31 Jul 2014 at 06:30AM.)   Assessment/Plan:  Assessment/Plan:  Assessment Diarrhea, Low abd pain. Possible colitis.   Plan If ok from cardiac point of view, will prep tomorrow for colonoscopy with Dr. Gustavo Lah Monday afternoon. thanks   Electronic Signatures: Verdie Shire (MD)  (Signed 19-Dec-15 09:06)  Authored: Chief Complaint, VITAL SIGNS/ANCILLARY NOTES, Brief Assessment, Lab Results, Assessment/Plan   Last Updated: 19-Dec-15 09:06 by Verdie Shire (MD)

## 2014-12-04 NOTE — Consult Note (Signed)
PATIENT NAME:  Ronald Good, Ronald Good#:  [id number] DATE OF BIRTH:  1933/10/09  DATE OF CONSULTATION:  06/28/2014  REFERRING PHYSICIAN:   CONSULTING PHYSICIAN:  Dwayne D. Callwood, MD  INDICATION: Rapid atrial fibrillation, possible ventricular tachycardia.  HISTORY OF PRESENT ILLNESS: The patient is a 81 year old male discharged from the hospital back in August with a hip fracture. He presented today with fever. The patient complained of feeling very weak. He was in bed. His wife called 911 complaining of chills. Temperature was 100. Brought here with weakness. Got to the Emergency Room. Temperature was 104. Placed in a cooling blanket. He was found to be in atrial fibrillation, rapid ventricular response, with several beats of ventricular tachycardia as well on amiodarone and broad-spectrum antibiotics including vancomycin and Zosyn.   REVIEW OF SYSTEMS: Fever. He has had some weakness and fatigue as well as chills and sweats. Denies weight loss, weight gain, hemoptysis, hematemesis. No bright red blood per rectum.   PAST MEDICAL HISTORY: Atrial fibrillation, hyperlipidemia, peripheral vascular disease, pericarditis, lower extremity edema, hypertension, BPH.  PAST SURGICAL HISTORY: Bilateral stents in the legs, knee surgery, recent total hip, total knee, cholecystectomy, hernia.  MEDICATIONS: Tramadol 50 mg every 8 hours, metoprolol 25 mg a day, hydrochlorothiazide 25 mg a day, Flomax 0.4 mg daily, calcium twice a day, Myrbetriq 25 mg daily.  ALLERGIES: HYDROCODONE, NEOSPORIN, OXYCODONE.   FAMILY HISTORY: Diabetes.   SOCIAL HISTORY: No smoking. No alcohol consumption. Lives with his wife.   PHYSICAL EXAMINATION:  VITAL SIGNS: Blood pressure 140/80. Pulse was tachycardic at 120 and irregular. Initial temperature was 105. Respiratory rate of 22.  HEENT: Normocephalic, atraumatic. Pupils equal and reactive to light.  NECK: Supple. No significant JVD, bruits, or adenopathy.  LUNGS: Clear to  auscultation and percussion. No significant rhonchi or rales.  HEART: Irregularly irregular. Tachycardic. Systolic ejection murmur at the apex.  ABDOMEN: Benign.  EXTREMITIES: Within normal limits.  NEUROLOGIC: Intact.  SKIN: Normal.   LABORATORY DATA: Magnesium 1.3, phosphorous 1.7. Troponin 0.02. LFTs negative. Sodium 138, potassium of 4, chloride of 103, bicarbonate 26, BUN of 11, creatinine of 0.87, glucose of 107. White cells 12.3, hemoglobin 14.8, hematocrit 44, platelet count of 127,000. Influenza A and B are negative. Urinalysis was negative. A pH of 7.5, pCO2 of 32, pO2 of 75.   Chest x-ray negative.   EKG: Atrial fibrillation, rapid ventricular response with PVCs.   ASSESSMENT: Sepsis, atrial fibrillation, paroxysmal ventricular tachycardia, hypomagnesemia, lower extremity edema, hypertension, benign prostatic hypertrophy, status post hip surgery.   PLAN:  1. Continue broad-spectrum antibiotics for possible hip infection. 2. Continue fluid resuscitation. 3. Continue rate control for atrial fibrillation. 4. Continue short-term anticoagulation for atrial fibrillation. DVT prophylaxis.  5. Correct electrolytes. 6. Lower extremity edema therapy with Lasix and compression stockings.  7. Treatment for hypertension.  8. Blood cultures, urine cultures. Hold off on physical therapy until his source of infection is identified. Ventricular tachycardia is probably related to his septic state. I do not recommend any direct therapy except for electrolyte correction. Echocardiogram may be helpful. Consider endocarditis if the blood cultures are positive.   ____________________________ Bobbie Stackwayne D. Juliann Paresallwood, MD ddc:jh D: 06/28/2014 14:08:14 ET T: 06/28/2014 14:30:37 ET JOB#: [id number]  cc: Dwayne D. Juliann Paresallwood, MD, <Dictator> Alwyn PeaWAYNE D CALLWOOD MD ELECTRONICALLY SIGNED 07/16/2014 16:39

## 2014-12-04 NOTE — Consult Note (Signed)
Brief Consult Note: Diagnosis: Pre-op Hip/AFIB.   Patient was seen by consultant.   Consult note dictated.   Recommend further assessment or treatment.   Orders entered.   Discussed with Attending MD.   Comments: IMP Pre-op Hip AFIB Obesity PVD HTN Hyperlipidemia BPH . PLAN Acceptable surgical risk Hold coumadin F/U PT/INR hopefully down prior to surgery AFI stable for now Agree with Bp control I will follow with you.  Electronic Signatures: Dorothyann Pengallwood, Dwayne D (MD)  (Signed 09-Aug-15 16:35)  Authored: Brief Consult Note   Last Updated: 09-Aug-15 16:35 by Dorothyann Pengallwood, Dwayne D (MD)

## 2014-12-04 NOTE — Op Note (Signed)
PATIENT NAME:  Ronald Good, Daveion E MR#:  [id number] DATE OF BIRTH:  [birth year]  DATE OF PROCEDURE:  03/21/2014  PREOPERATIVE DIAGNOSIS: Impacted subcapital fracture, left hip.   POSTOPERATIVE DIAGNOSIS: Impacted subcapital fracture, left hip.  PROCEDURE PERFORMED: Percutaneous pinning, left hip (four 7.5 mm cannulated screws).   SURGEON: Valinda HoarHoward E. Taiten Brawn, MD  ANESTHESIA: Spinal.   COMPLICATIONS: None.   DRAINS: None.   ESTIMATED BLOOD LOSS: Minimal.  REPLACED: None.   DESCRIPTION OF PROCEDURE: The patient was brought to the operating room, where he underwent satisfactory spinal anesthesia and was placed on the fracture table and padded appropriately. The right leg was flexed and abducted and the left leg was placed in gentle traction and internal rotation. The left hip was prepped and draped in sterile fashion, and 4 stab wounds were made. Guide pins were inserted under fluoroscopic control into the head and neck of the femur. These were drilled and filled with 4 long-threaded 7.3 mm Synthes cannulated screws. These were cinched and tightened nicely. The traction was released and the screws retightened. Fluoroscopy showed good positioning of the fracture and the screws. The stab wounds were closed with 3-0 nylon suture. Dry sterile dressing was applied, and the patient was transferred to his hospital bed with good range of motion of the hip. He was taken to recovery in good condition.     ____________________________ Valinda HoarHoward E. Lashea Goda, MD hem:sk D: 03/21/2014 21:05:21 ET T: 03/21/2014 22:52:51 ET JOB#: [id number]  cc: Valinda HoarHoward E. Helena Sardo, MD, <Dictator> Valinda HoarHOWARD E Brelan Hannen MD ELECTRONICALLY SIGNED 03/22/2014 15:29

## 2014-12-04 NOTE — H&P (Signed)
PATIENT NAME:  Ronald Good, Ronald Good MR#:  [id number] DATE OF BIRTH:  April 17, 1934  DATE OF ADMISSION:  06/26/2014  ADDENDUM  We now have the complete list for Ronald Good. His medications are as follow:  1.  Tramadol 50 mg q. 8 hours p.r.n.  2.  Metoprolol 25 mg daily.  3.  Hydrochlorothiazide 25 mg daily.  4.  Flomax 0.4 mg daily.  5.  Calcium plus vitamin D 1 tablet b.i.d.  6.  Myrbetriq 25 mg daily.   ____________________________ Ronald ContesSital P. Juliene PinaMody, MD spm:ts D: 06/26/2014 20:11:31 ET T: 06/26/2014 21:08:32 ET JOB#: [id number]  cc: Ronald Mayabb P. Juliene PinaMody, MD, <Dictator> Ronald ContesSITAL P Fryda Molenda MD ELECTRONICALLY SIGNED 06/27/2014 15:38

## 2014-12-04 NOTE — Consult Note (Signed)
Chief Complaint:  Subjective/Chief Complaint seenfor diarrheal illness, abdominal pain and abnormal CT.  continues with lower/generalized abdominal pain, mild.  no n/v.  stools improving before prep for colonoscopy.  tolerated colonoscopy prep.   VITAL SIGNS/ANCILLARY NOTES: **Vital Signs.:   21-Dec-15 09:37  Vital Signs Type Pre Medication  Temperature Temperature (F) 100.5  Celsius 38  Temperature Source axillary  Pulse Pulse 92  Systolic BP Systolic BP 762  Diastolic BP (mmHg) Diastolic BP (mmHg) 71  Mean BP 85    11:45  Vital Signs Type Recheck  Temperature Temperature (F) 98.1  Celsius 36.7  Temperature Source oral  *Intake and Output.:   21-Dec-15 02:18  Stool  large amount watery stool   Brief Assessment:  Cardiac Irregular   Respiratory clear BS   Gastrointestinal details normal Soft  Nondistended  Bowel sounds normal  No rebound tenderness  mild diffuse tenderness, moreso noted in the bilateral lower quadrants, mostly right   Lab Results: Routine Micro:  19-Dec-15 09:16   Micro Text Report STOOL COMPREHENSIVE   COMMENT                   NO SALMONELLA OR SHIGELLA ISOLATED   COMMENT                   NO PATHOGENIC E.COLI DETECTED   COMMENT                   NO CAMPYLOBACTER ANTIGEN DETECTED   ANTIBIOTIC                        Culture Comment NO SALMONELLA OR SHIGELLA ISOLATED  Culture Comment . NO PATHOGENIC E.COLI DETECTED  Culture Comment    . NO CAMPYLOBACTER ANTIGEN DETECTED  Result(s) reported on 02 Aug 2014 at 09:47AM.  Cardiology:  18-Dec-15 08:55   ECG interpretation Atrial fibrillation Right bundle branch block Abnormal ECG When compared with ECG of 28-Jul-2014 05:10, No significant change was found Confirmed by CALLWOOD, DWAYNE ([id number]) on 08/02/2014 12:44:08 PM  Overreader: Lujean Amel  Routine Chem:  21-Dec-15 04:24   Glucose, Serum 89  BUN 10  Creatinine (comp) 0.90  Sodium, Serum 138  Potassium, Serum 3.6  Chloride, Serum 103   CO2, Serum 28  Calcium (Total), Serum  8.3  Anion Gap 7  Osmolality (calc) 274  eGFR (African American) >60  eGFR (Non-African American) >60 (eGFR values <24mL/min/1.73 m2 may be an indication of chronic kidney disease (CKD). Calculated eGFR, using the MRDR Study equation, is useful in  patients with stable renal function. The eGFR calculation will not be reliable in acutely ill patients when serum creatinine is changing rapidly. It is not useful in patients on dialysis. The eGFR calculation may not be applicable to patients at the low and high extremes of body sizes, pregnant women, and vegetarians.)  Routine Hem:  19-Dec-15 06:11   Platelet Count (CBC) 153   Assessment/Plan:  Assessment/Plan:  Assessment 1) acute diarrheal illness, abdominal pain, colitis per CT.   continued abdominal pain, stools improving. Pain is mostly rlq, CT abnormal rectum and distal sigmoid. Non-toxigenic C diff noted. On flagyl.  2) AF, rate currently controlled. off pradaxa for 3-4 days.  3) h/o hyperlipidemia, htn, PVD, AAA.   Plan 1) colonoscopy today.  I have discussed the risks benefits  and complications of egd to include not limited to bleeding infection perforation and sedation and he wishes to proceed.   further recs to follow.  Electronic Signatures: Loistine Simas (MD)  (Signed 21-Dec-15 14:15)  Authored: Chief Complaint, VITAL SIGNS/ANCILLARY NOTES, Brief Assessment, Lab Results, Assessment/Plan   Last Updated: 21-Dec-15 14:15 by Loistine Simas (MD)

## 2014-12-04 NOTE — Discharge Summary (Signed)
PATIENT NAME:  Ronald Good, Ronald Good MR#:  [id number] DATE OF BIRTH:  [birth year]  DATE OF ADMISSION:  07/28/2014 DATE OF DISCHARGE:  08/03/2014  DISCHARGE DIAGNOSES: 1.  Acute diarrhea with abdominal pain that is resolving, unclear etiology. Status post colonoscopy without acute findings.  2.  Atrial fibrillation, on Pradaxa.  3.  Candidiasis in the groin area.  4.  Urinary tract infection.   DISCHARGE MEDICATIONS: 1.  Metoprolol succinate 25 mg p.o. daily.  2.  Acetaminophen 500 mg p.o. at bedtime p.r.n. for pain.  3.  Calcium/vitamin D 500/200 p.o. b.i.d.  4.  Tamsulosin 0.4 mg daily.  5.  Tramadol 50 mg q. 6 hours as needed for pain.  6.  Pradaxa 150 mg p.o. b.i.d.  7.  Amiodarone 200 mg p.o. daily.  8.  Furosemide 20 mg p.o. b.i.d.  9.  Myrbetriq 25 mg extended-release 1 tab daily.  10.   Ventolin HFA 90 mcg 2 puffs every 4 hours as needed p.r.n. for wheezing.  11.   Nexium 22.3 mg p.o. daily.  12.   Nystatin 100,000 units per gram topical powder t.i.d.  13.   Metronidazole 500 mg p.o. t.i.d. x 3 more days.   CONSULTATIONS: GI per Dr. Marva PandaSkulskie.   PROCEDURES: Colonoscopy per Dr. Marva PandaSkulskie.   PERTINENT LABORATORIES AND STUDIES PRIOR TO DISCHARGE: Sodium 138, potassium 3.6, creatinine 0.9, and glucose of 89. White blood cell count 9.9, hemoglobin 11.9, platelets 138,000. Stool cultures were negative. Clostridium difficile was also negative for toxins and PCR.   BRIEF HOSPITAL COURSE:  1.  Acute diarrhea with abdominal pain. The patient initially came in with acute diarrhea with abdominal pain concerning for possible Clostridium difficile. Initial Clostridium difficile was positive but further toxin and PCR were negative. He was positive for an antigen only. Other stool cultures were also negative. His diarrhea did improve but his abdominal pain continued. GI was consulted. Colonoscopy was performed. No acute findings were seen. It did show some diverticulosis but no significant colitis. He  was treated with Flagyl and overall clinical picture did improve. Plan is to complete 10 days of Flagyl, follow up with Dr. Marva PandaSkulskie as an outpatient.  2.  Atrial fibrillation remained stable throughout his hospital course. His Pradaxa was held prior to his procedure and his Pradaxa was resumed prior to discharge.  3.  Urinary tract infection. This was an acute new finding. Positive for 100,000 gram-negative rods, was treated with ceftriaxone initially and treated with Keflex. Completed a 7-day course of antibiotics and can be discharged without further antibiotics for that issue.  4.  Candidiasis. He has this in his groin area due to continued incontinence. Discussed this with the family before about keeping him dry and clean and we will apply nystatin powder t.i.d. The patient is weak and is not able to perform ADLs, therefore recommending home health PT, nursing, and nurse aide. All of this may resume as he had this prior to admission.   He is in stable condition and will be discharged to home with home health services. Follow up with Dr. Burnadette PopLinthavong in 10 days and follow up with Dr. Marva PandaSkulskie within 14 days.    ____________________________ Ronald IvanKanhka Ronald Mozer, MD kl:at D: 08/03/2014 08:31:51 ET T: 08/03/2014 09:36:04 ET JOB#: [id number]  cc: Ronald IvanKanhka Ac Colan, MD, <Dictator> Ronald IvanKANHKA Jule Whitsel MD ELECTRONICALLY SIGNED 08/03/2014 15:28

## 2014-12-04 NOTE — Discharge Summary (Signed)
PATIENT NAME:  Ronald Good, Jovani E MR#:  [id number] DATE OF BIRTH:  Sep 26, 1933  DATE OF ADMISSION:  03/20/2014 DATE OF DISCHARGE:  03/24/2014  DISCHARGE DIAGNOSES: 1.  Acute left hip fracture status post repair.  2.  Atrial fibrillation, on Coumadin, subtherapeutic with INR of 1.5.   DISCHARGE MEDICATIONS: 1.  Hydrochlorothiazide 25 mg p.o. daily.  2.  Metoprolol 25 mg p.o. daily. This is the succinate extended release.  3.  Warfarin 4 mg 1 tab once a day on Wednesday and 1.5 mg tab every other day.   4.  Flomax 0.4 mg daily.  5.  Acetaminophen 500 mg p.o. every 4 hours as needed for pain.  6.  Tramadol 50 mg 1 tab p.o. q. 8 hours as needed for severe pain.  7.  Lovenox 30 mg subcutaneous injection b.i.d. Continue until INR is therapeutic x 48 hours.  8.  Calcium vitamin D 500/200, 1 tab p.o. b.i.d.   CONSULTATIONS: Orthopedics.   PROCEDURES: Left hip fracture repair.   PERTINENT LABORATORIES AND STUDIES: On day of discharge, sodium 133, potassium 3.5, creatinine 0.92, glucose 99. White blood cell count 9, hemoglobin 13.7, platelets of 105, and  INR 1.5.   BRIEF HOSPITAL COURSE:  1.  Acute left hip fracture. The patient initially came in with acute left hip fracture status post fall, was evaluated by orthopedics, and underwent surgery without complications. Further rehab and SNF.  2.  Atrial fibrillation, on Coumadin. Coumadin was held and was given vitamin K to lower the INR so that he would qualify for surgery. Afterwards, he was placed back on his Coumadin and Lovenox and currently is bridging with a subtherapeutic INR. The patient is to have his Coumadin checked tomorrow and the following day until he is therapeutic. Once he is therapeutic x 48 hours, he can stop the Lovenox.   DISPOSITION: The patient is in stable condition and being discharged to rehab for further therapy.   Please note, the patient needs an INR tomorrow; diagnosis, anticoagulation therapy.     ____________________________ Marisue IvanKanhka Shamari Lofquist, MD kl:at D: 03/24/2014 13:10:20 ET T: 03/24/2014 13:58:12 ET JOB#: [id number]  cc: Marisue IvanKanhka Harrietta Incorvaia, MD, <Dictator> Marisue IvanKANHKA Judy Pollman MD ELECTRONICALLY SIGNED 04/12/2014 8:27

## 2014-12-06 LAB — SURGICAL PATHOLOGY

## 2015-01-27 ENCOUNTER — Inpatient Hospital Stay
Admission: AD | Admit: 2015-01-27 | Discharge: 2015-02-03 | DRG: 603 | Disposition: A | Discharge status: Skilled Nursing Facility | Payer: Commercial Managed Care - HMO | Source: Ambulatory Visit | Attending: Vascular Surgery | Admitting: Vascular Surgery

## 2015-01-27 ENCOUNTER — Encounter: Payer: Self-pay | Admitting: Internal Medicine

## 2015-01-27 DIAGNOSIS — L03116 Cellulitis of left lower limb: Secondary | ICD-10-CM | POA: Diagnosis present

## 2015-01-27 DIAGNOSIS — Z87891 Personal history of nicotine dependence: Secondary | ICD-10-CM

## 2015-01-27 DIAGNOSIS — I7025 Atherosclerosis of native arteries of other extremities with ulceration: Secondary | ICD-10-CM | POA: Diagnosis present

## 2015-01-27 DIAGNOSIS — I482 Chronic atrial fibrillation: Secondary | ICD-10-CM | POA: Diagnosis present

## 2015-01-27 DIAGNOSIS — L02419 Cutaneous abscess of limb, unspecified: Secondary | ICD-10-CM

## 2015-01-27 DIAGNOSIS — Z7902 Long term (current) use of antithrombotics/antiplatelets: Secondary | ICD-10-CM | POA: Diagnosis not present

## 2015-01-27 DIAGNOSIS — L97811 Non-pressure chronic ulcer of other part of right lower leg limited to breakdown of skin: Secondary | ICD-10-CM | POA: Diagnosis present

## 2015-01-27 DIAGNOSIS — E780 Pure hypercholesterolemia: Secondary | ICD-10-CM | POA: Diagnosis present

## 2015-01-27 DIAGNOSIS — R1310 Dysphagia, unspecified: Secondary | ICD-10-CM | POA: Diagnosis present

## 2015-01-27 DIAGNOSIS — L03115 Cellulitis of right lower limb: Secondary | ICD-10-CM | POA: Diagnosis present

## 2015-01-27 DIAGNOSIS — R531 Weakness: Secondary | ICD-10-CM | POA: Diagnosis present

## 2015-01-27 DIAGNOSIS — E876 Hypokalemia: Secondary | ICD-10-CM | POA: Diagnosis present

## 2015-01-27 DIAGNOSIS — Z96653 Presence of artificial knee joint, bilateral: Secondary | ICD-10-CM | POA: Diagnosis present

## 2015-01-27 DIAGNOSIS — R234 Changes in skin texture: Secondary | ICD-10-CM | POA: Diagnosis present

## 2015-01-27 DIAGNOSIS — N400 Enlarged prostate without lower urinary tract symptoms: Secondary | ICD-10-CM | POA: Diagnosis present

## 2015-01-27 DIAGNOSIS — L97821 Non-pressure chronic ulcer of other part of left lower leg limited to breakdown of skin: Secondary | ICD-10-CM | POA: Diagnosis present

## 2015-01-27 DIAGNOSIS — I8390 Asymptomatic varicose veins of unspecified lower extremity: Secondary | ICD-10-CM | POA: Diagnosis present

## 2015-01-27 DIAGNOSIS — E669 Obesity, unspecified: Secondary | ICD-10-CM | POA: Diagnosis present

## 2015-01-27 DIAGNOSIS — I5022 Chronic systolic (congestive) heart failure: Secondary | ICD-10-CM | POA: Diagnosis present

## 2015-01-27 DIAGNOSIS — I10 Essential (primary) hypertension: Secondary | ICD-10-CM | POA: Diagnosis present

## 2015-01-27 DIAGNOSIS — Z833 Family history of diabetes mellitus: Secondary | ICD-10-CM

## 2015-01-27 DIAGNOSIS — Z6828 Body mass index (BMI) 28.0-28.9, adult: Secondary | ICD-10-CM

## 2015-01-27 DIAGNOSIS — L97321 Non-pressure chronic ulcer of left ankle limited to breakdown of skin: Secondary | ICD-10-CM | POA: Diagnosis present

## 2015-01-27 DIAGNOSIS — I872 Venous insufficiency (chronic) (peripheral): Secondary | ICD-10-CM | POA: Diagnosis present

## 2015-01-27 DIAGNOSIS — B965 Pseudomonas (aeruginosa) (mallei) (pseudomallei) as the cause of diseases classified elsewhere: Secondary | ICD-10-CM | POA: Diagnosis present

## 2015-01-27 DIAGNOSIS — Z79899 Other long term (current) drug therapy: Secondary | ICD-10-CM

## 2015-01-27 DIAGNOSIS — L03119 Cellulitis of unspecified part of limb: Secondary | ICD-10-CM | POA: Diagnosis present

## 2015-01-27 DIAGNOSIS — I890 Lymphedema, not elsewhere classified: Secondary | ICD-10-CM | POA: Diagnosis present

## 2015-01-27 DIAGNOSIS — K219 Gastro-esophageal reflux disease without esophagitis: Secondary | ICD-10-CM | POA: Diagnosis present

## 2015-01-27 HISTORY — DX: Gastro-esophageal reflux disease without esophagitis: K21.9

## 2015-01-27 HISTORY — DX: Unspecified atrial fibrillation: I48.91

## 2015-01-27 HISTORY — DX: Chronic systolic (congestive) heart failure: I50.22

## 2015-01-27 HISTORY — DX: Essential (primary) hypertension: I10

## 2015-01-27 HISTORY — DX: Benign prostatic hyperplasia without lower urinary tract symptoms: N40.0

## 2015-01-27 LAB — COMPREHENSIVE METABOLIC PANEL
ALT: 21 U/L (ref 17–63)
AST: 44 U/L — ABNORMAL HIGH (ref 15–41)
Albumin: 2.6 g/dL — ABNORMAL LOW (ref 3.5–5.0)
Alkaline Phosphatase: 139 U/L — ABNORMAL HIGH (ref 38–126)
Anion gap: 5 (ref 5–15)
BUN: 17 mg/dL (ref 6–20)
CALCIUM: 7.8 mg/dL — AB (ref 8.9–10.3)
CO2: 28 mmol/L (ref 22–32)
Chloride: 101 mmol/L (ref 101–111)
Creatinine, Ser: 0.94 mg/dL (ref 0.61–1.24)
GLUCOSE: 90 mg/dL (ref 65–99)
Potassium: 3.7 mmol/L (ref 3.5–5.1)
Sodium: 134 mmol/L — ABNORMAL LOW (ref 135–145)
Total Bilirubin: 1.8 mg/dL — ABNORMAL HIGH (ref 0.3–1.2)
Total Protein: 6.1 g/dL — ABNORMAL LOW (ref 6.5–8.1)

## 2015-01-27 LAB — CBC WITH DIFFERENTIAL/PLATELET
BASOS ABS: 0 10*3/uL (ref 0–0.1)
Basophils Relative: 0 %
EOS PCT: 7 %
Eosinophils Absolute: 0.7 10*3/uL (ref 0–0.7)
HCT: 45.4 % (ref 40.0–52.0)
Hemoglobin: 14.7 g/dL (ref 13.0–18.0)
LYMPHS ABS: 0.9 10*3/uL — AB (ref 1.0–3.6)
LYMPHS PCT: 9 %
MCH: 33.3 pg (ref 26.0–34.0)
MCHC: 32.4 g/dL (ref 32.0–36.0)
MCV: 102.8 fL — AB (ref 80.0–100.0)
Monocytes Absolute: 1.1 10*3/uL — ABNORMAL HIGH (ref 0.2–1.0)
Monocytes Relative: 11 %
Neutro Abs: 7.4 10*3/uL — ABNORMAL HIGH (ref 1.4–6.5)
Neutrophils Relative %: 73 %
Platelets: 176 10*3/uL (ref 150–440)
RBC: 4.42 MIL/uL (ref 4.40–5.90)
RDW: 16 % — ABNORMAL HIGH (ref 11.5–14.5)
WBC: 10.3 10*3/uL (ref 3.8–10.6)

## 2015-01-27 LAB — ABO/RH: ABO/RH(D): A POS

## 2015-01-27 LAB — MAGNESIUM: MAGNESIUM: 1.8 mg/dL (ref 1.7–2.4)

## 2015-01-27 LAB — PHOSPHORUS: PHOSPHORUS: 3.6 mg/dL (ref 2.5–4.6)

## 2015-01-27 MED ORDER — MAGNESIUM SULFATE 2 GM/50ML IV SOLN
2.0000 g | Freq: Once | INTRAVENOUS | Status: AC
  Administered 2015-01-27: 2 g via INTRAVENOUS
  Filled 2015-01-27: qty 50

## 2015-01-27 MED ORDER — ALBUTEROL SULFATE HFA 108 (90 BASE) MCG/ACT IN AERS: 1.2000 | INHALATION_SPRAY | RESPIRATORY_TRACT | Status: DC | PRN

## 2015-01-27 MED ORDER — CLOTRIMAZOLE-BETAMETHASONE 1-0.05 % EX CREA
TOPICAL_CREAM | Freq: Two times a day (BID) | CUTANEOUS | Status: DC
  Administered 2015-01-27 – 2015-02-01 (×12): via TOPICAL
  Filled 2015-01-27 (×3): qty 15

## 2015-01-27 MED ORDER — ALBUTEROL SULFATE HFA 108 (90 BASE) MCG/ACT IN AERS: 1.0000 | INHALATION_SPRAY | RESPIRATORY_TRACT | Status: DC | PRN

## 2015-01-27 MED ORDER — AMIODARONE HCL 200 MG PO TABS
200.0000 mg | ORAL_TABLET | Freq: Every day | ORAL | Status: DC
  Administered 2015-01-28 – 2015-02-03 (×7): 200 mg via ORAL
  Filled 2015-01-27 (×7): qty 1

## 2015-01-27 MED ORDER — TRAMADOL HCL 50 MG PO TABS
100.0000 mg | ORAL_TABLET | Freq: Four times a day (QID) | ORAL | Status: DC | PRN
  Administered 2015-01-27 – 2015-01-28 (×4): 100 mg via ORAL
  Filled 2015-01-27 (×4): qty 2

## 2015-01-27 MED ORDER — SODIUM CHLORIDE 1 G PO TABS
1.0000 g | ORAL_TABLET | Freq: Once | ORAL | Status: AC
  Administered 2015-01-27: 1 g via ORAL
  Filled 2015-01-27: qty 1

## 2015-01-27 MED ORDER — DABIGATRAN ETEXILATE MESYLATE 150 MG PO CAPS
150.0000 mg | ORAL_CAPSULE | Freq: Two times a day (BID) | ORAL | Status: DC
  Administered 2015-01-27 – 2015-02-03 (×14): 150 mg via ORAL
  Filled 2015-01-27 (×14): qty 1

## 2015-01-27 MED ORDER — FUROSEMIDE 20 MG PO TABS
20.0000 mg | ORAL_TABLET | Freq: Two times a day (BID) | ORAL | Status: DC
  Administered 2015-01-27 – 2015-02-03 (×12): 20 mg via ORAL
  Filled 2015-01-27 (×13): qty 1

## 2015-01-27 MED ORDER — METOLAZONE 5 MG PO TABS
5.0000 mg | ORAL_TABLET | Freq: Every day | ORAL | Status: DC
  Administered 2015-01-28 – 2015-02-03 (×7): 5 mg via ORAL
  Filled 2015-01-27 (×8): qty 1

## 2015-01-27 MED ORDER — SILVER SULFADIAZINE 1 % EX CREA
TOPICAL_CREAM | Freq: Two times a day (BID) | CUTANEOUS | Status: DC
  Administered 2015-01-27 – 2015-02-03 (×15): via TOPICAL
  Filled 2015-01-27: qty 20
  Filled 2015-01-27: qty 85
  Filled 2015-01-27 (×6): qty 20

## 2015-01-27 MED ORDER — ALBUTEROL SULFATE (2.5 MG/3ML) 0.083% IN NEBU: 2.5000 mg | INHALATION_SOLUTION | RESPIRATORY_TRACT | Status: DC | PRN

## 2015-01-27 MED ORDER — ACETAMINOPHEN 325 MG PO TABS: 650.0000 mg | ORAL_TABLET | Freq: Four times a day (QID) | ORAL | Status: DC | PRN

## 2015-01-27 MED ORDER — TAMSULOSIN HCL 0.4 MG PO CAPS
0.4000 mg | ORAL_CAPSULE | Freq: Every day | ORAL | Status: DC
  Administered 2015-01-28 – 2015-02-03 (×7): 0.4 mg via ORAL
  Filled 2015-01-27 (×7): qty 1

## 2015-01-27 MED ORDER — TRAMADOL HCL 50 MG PO TABS: 50.0000 mg | ORAL_TABLET | Freq: Four times a day (QID) | ORAL | Status: DC | PRN

## 2015-01-27 MED ORDER — PANTOPRAZOLE SODIUM 20 MG PO TBEC
20.0000 mg | DELAYED_RELEASE_TABLET | Freq: Every day | ORAL | Status: DC
  Filled 2015-01-27: qty 1

## 2015-01-27 MED ORDER — METOPROLOL SUCCINATE ER 25 MG PO TB24
25.0000 mg | ORAL_TABLET | Freq: Every day | ORAL | Status: DC
  Administered 2015-01-28 – 2015-02-03 (×6): 25 mg via ORAL
  Filled 2015-01-27 (×7): qty 1

## 2015-01-27 MED ORDER — PANTOPRAZOLE SODIUM 40 MG PO TBEC
40.0000 mg | DELAYED_RELEASE_TABLET | Freq: Every day | ORAL | Status: DC
  Administered 2015-01-27 – 2015-02-03 (×8): 40 mg via ORAL
  Filled 2015-01-27 (×8): qty 1

## 2015-01-27 MED ORDER — SODIUM CHLORIDE 0.9 % IV SOLN
3.0000 g | Freq: Four times a day (QID) | INTRAVENOUS | Status: DC
  Administered 2015-01-27 – 2015-01-31 (×17): 3 g via INTRAVENOUS
  Filled 2015-01-27 (×25): qty 3

## 2015-01-27 MED ORDER — ACETAMINOPHEN 650 MG RE SUPP: 650.0000 mg | Freq: Four times a day (QID) | RECTAL | Status: DC | PRN

## 2015-01-27 NOTE — H&P (Signed)
[facility] History & Physical Update  The patient was interviewed and re-examined.  The patient's previous History and Physical has been reviewed and is unchanged.  There is no change in the plan of care.  He is directly admitted to hospital with multiple ulcerations and cellulitis of the lower extremities.  See note in paper chart  Blayklee Mable, Latina Craver, MD  01/27/2015, 1:02 PM

## 2015-01-27 NOTE — Progress Notes (Signed)
Pt was admitted from Dr. Gilda Crease office. IV started, Antibiotics given. Dressing applied to bilateral legs as ordered. Pt given pain meds once.

## 2015-01-27 NOTE — Consult Note (Signed)
[facility] [place] [facility]   PATIENT NAME: Ronald Good    MR#:  [id number]  DATE OF BIRTH:  11-Nov-1933  DATE OF ADMISSION:  01/27/2015  PRIMARY CARE PHYSICIAN: Marisue Ivan, MD   REQUESTING/REFERRING PHYSICIAN: Levora Dredge  CHIEF COMPLAINT:  Sent in as a direct admission for cellulitis of the lower extremities  HISTORY OF PRESENT ILLNESS:  Ronald Good  is a 81 y.o. male with a known history of systolic congestive heart failure, atrial fibrillation, essential hypertension and peripheral vascular disease. Patient was sent in for cellulitis and ulcerations of bilateral lower extremities. Patient was seen in Dr. Colman Cater office today and sent in for direct admission and his legs are wrapped. He's noticed redness on his legs for a while and his toes are swollen and erythematous. Scabbing is seen on his legs. He does have 7-8 out of 10 pain in the lower extremities all the time. Hospital services work consult. for further evaluation.  PAST MEDICAL HISTORY:   Past Medical History  Diagnosis Date  . Chronic systolic congestive heart failure   . BPH (benign prostatic hyperplasia)   . Atrial fibrillation   . Essential hypertension   . Gastroesophageal reflux disease     PAST SURGICAL HISTOIRY:   Past Surgical History  Procedure Laterality Date  . Replacement total knee bilateral Bilateral     SOCIAL HISTORY:   History  Substance Use Topics  . Smoking status: Never Smoker   . Smokeless tobacco: Not on file  . Alcohol Use: No    FAMILY HISTORY:  Both parents died of old age.  DRUG ALLERGIES:   Allergies  Allergen Reactions  . Oxycodone Other (See Comments)    "makes me loopy"  . Neomycin-Bacitracin Zn-Polymyx Rash    REVIEW OF SYSTEMS:  CONSTITUTIONAL: No fever, fatigue or weakness. Some weight loss. Feels cold. EYES: No blurred or double vision. Wears glasses. EARS, NOSE, AND THROAT: No tinnitus or ear pain. Decreased  hearing. Positive for runny nose. Trouble swallowing last night at dinner. RESPIRATORY: No cough, shortness of breath, wheezing or hemoptysis.  CARDIOVASCULAR: No chest pain or palpitations.  GASTROINTESTINAL: No nausea, vomiting, diarrhea or abdominal pain. Some blood in the bowel movements when wiping only. GENITOURINARY: No dysuria, hematuria.  ENDOCRINE: No polyuria, nocturia,  HEMATOLOGY: No anemia, easy bruising or bleeding SKIN: Itching and scaling on hands and upper extremities. The redness and ulcerations on legs. MUSCULOSKELETAL: Pain in bilateral legs. NEUROLOGIC: No tingling, numbness, weakness.  PSYCHIATRY: No anxiety or depression.   MEDICATIONS AT HOME:   Prior to Admission medications   Medication Sig Start Date End Date Taking? Authorizing Provider  amiodarone (PACERONE) 200 MG tablet Take 200 mg by mouth daily. 11/19/14  Yes Historical Provider, MD  dabigatran (PRADAXA) 150 MG CAPS capsule Take 150 mg by mouth 2 (two) times daily. 10/11/14  Yes Historical Provider, MD  furosemide (LASIX) 20 MG tablet Take 20 mg by mouth 2 (two) times daily. 07/14/14  Yes Historical Provider, MD  metolazone (ZAROXOLYN) 5 MG tablet Take 5 mg by mouth daily. Take 30 minutes before AM lasix 12/02/14  Yes Historical Provider, MD  pantoprazole (PROTONIX) 20 MG tablet Take 20 mg by mouth daily. 12/13/14  Yes Historical Provider, MD  tamsulosin (FLOMAX) 0.4 MG CAPS capsule Take 0.4 mg by mouth daily. 01/24/15  Yes Historical Provider, MD  traMADol (ULTRAM) 50 MG tablet Take 50 mg by mouth 4 (four) times daily as needed. As needed for pain 02/22/14  Yes Historical Provider, MD  albuterol (VENTOLIN HFA) 108 (90 BASE) MCG/ACT inhaler Inhale 108 mcg into the lungs every 4 (four) hours as needed. 2 inhalations into lungs every four hours as needed for wheezing    Historical Provider, MD  metoprolol succinate (TOPROL-XL) 50 MG 24 hr tablet Take 25 mg by mouth daily.    Historical Provider, MD      VITAL SIGNS:   Blood pressure 112/72, pulse 66, temperature 97.7 F (36.5 C), temperature source Oral, resp. rate 20, height 5\' 10"  (1.778 m), weight 84.55 kg (186 lb 6.4 oz), SpO2 88 %.  PHYSICAL EXAMINATION:  GENERAL:  81 y.o.-year-old patient lying in the bed with no acute distress.  EYES: Pupils equal, round, reactive to light and accommodation. No scleral icterus. Extraocular muscles intact.  HEENT: Head atraumatic, normocephalic. Oropharynx and nasopharynx clear.  NECK:  Supple, no jugular venous distention. No thyroid enlargement, no tenderness.  LUNGS: Normal breath sounds bilaterally, no wheezing, rales,rhonchi or crepitation. No use of accessory muscles of respiration.  CARDIOVASCULAR: S1, S2 irregularly irregular, 2/6 systolic murmurs, no rubs, or gallops.  ABDOMEN: Soft, nontender, nondistended. Bowel sounds present. No organomegaly or mass.  EXTREMITIES: 3+ edema. No cyanosis, or clubbing.  NEUROLOGIC: Cranial nerves II through XII are intact. Muscle strength 5/5 in all extremities. Gait not checked.  PSYCHIATRIC: The patient is alert and oriented x 3.  SKIN: Legs were just wrapped. All of his toes are erythematous and swollen and scabbing. Upper shins have bilateral scabs. Hands have scaling and also erythematous.  LABORATORY PANEL:   CBC  Recent Labs Lab 01/27/15 1249  WBC 10.3  HGB 14.7  HCT 45.4  PLT 176   Chemistries   Recent Labs Lab 01/27/15 1249  MG 1.8    IMPRESSION AND PLAN:   1. Bilateral lower extremity cellulitis and ulcerations- sent in by Dr. Gilda Crease for direct admission. He was started on IV Unasyn. I will order wound culture. Blood cultures. 2. Chronic systolic congestive heart failureof heart failure at this time continue usual medications. 3. Atrial fibrillation patient takes Pradaxa for anticoagulation and metoprolol and amiodarone for rate control. 4. Essential hypertension blood pressure stable on usual medications. 5. Gastroesophageal reflux disease  without esophagitis continue Protonix. 6. BPH without urinary symptoms continue Flomax. 7. Scaling on hands can give Lotrisone ointment.  Management plans discussed with the patient, family and he is in agreement.  CODE STATUS: Full code  TOTAL TIME TAKING CARE OF THIS PATIENT: 55 minutes.    Alford Highland M.D on 01/27/2015 at 1:33 PM  Between 7am to 6pm - Pager - [phone]  After 6pm go to [email or web address] - password [email or web address] [facility]  [facility]  Office  [phone]  CC: Primary care Physician: Marisue Ivan, MD

## 2015-01-27 NOTE — H&P (Signed)
[facility] History & Physical Exam  History of Present Illness:  The patient is a 81 year old male who presented for a weekly Unna Boot change but asked to be seen by provider. Patient was last seen in the office on 01/20/15 for a scheduled wrap change. Patient complaining of "soaking" through his [employer] wraps before scheduled changes. The patient is experiencing an continuous weeping of the bilateral lower extremities. States they are "red", "painful" and "smell". The patient is not elevating his legs like recommended.   Problem List/Past Medical History: Swelling, limb (729.81   M79.89) Varicose veins of lower extremity without ulcer or inflammation (454.9   I83.90) Peripheral vascular disease (443.9   I73.9) Hypertension (401.9   I10). (Followed by PCP.) Ulcer of ankle (707.13   L97.309).(Rewrapped and encouraged to come to office for wraps twice a week.) Abnormal heart rhythms (427.9   I49.9) Venous insufficiency (459.81   I87.2) Aneurysm of artery of lower extremity (442.3   I72.4) Atherosclerotic PVD with intermittent claudication (440.21   I73.9) Atherosclerosis of native arteries of the extremities with ulceration (440.23   I73.9) Abdominal aortic aneurysm (441.4   I71.4) Hypercholesterolemia (272.0   E78.0). Followed by PCP. Obesity (278.00   E66.9). Followed by PCP. CHF  Allergies: Neosporin AF *DERMATOLOGICALS*  Family History: Diabetes Mellitus. Mother. Abdominal Aortic Aneurysm. Father.  Social History: Tobacco use. Former smoker. Non Drinker/No Alcohol Use  Medication History: Pradaxa (150MG  Capsule, 1 Oral BID) Active. Pantoprazole Sodium (20MG  Tablet DR, 1 Oral daily) Active. Metolazone (5MG  Tablet, 1 Oral daily) Active. Amiodarone HCl (200MG  Tablet, Oral) Active. Calcium Carbonate (Oral) Specific dose unknown - Active. Furosemide (20MG  Tablet, Oral) Active. Tamsulosin HCl (0.4MG  Capsule ER 24HR, Oral) Active. TraMADol HCl  (50MG  Tablet, Oral) Active. Ventolin HFA (108 (90 Base)MCG/ACT Aerosol Soln, Inhalation) Active. Metoprolol Tartrate (1) Specific dose unknown - Active. Acetaminophen ( Oral) Specific dose unknown - Active.  Past Surgical History: Total Knee Replacement - Left. 10/27/1990 Total Knee Replacement - Right. 08/25/01 Hernia Repair. double- 1955, 07/23/04 Lower back surgery. 09/1998 Endovascular Abdominal Aortic Aneurysm Repair. 11/04/96 Cholecystectomy. 10/10/93 Wrist surgery. 1980  Weight: 204 lb Height: 71 in Body Surface Area: 2.15 m Body Mass Index: 28.45 kg/m  Physical Exam:  General Build and Nutrition: Obese. Well developed and well nourished. Appearance- Does not appear in pain. No acute distress.  Integumentary: Inspection of the skin reveals - ulcerations on the lower extremities (BLE: largest single ulceration is left medial ankle, but multiple areas of skin breakdown on both legs. Erythematous, serous drainage is present and foul smelling.).  Head and Neck:  Carotid arteries - No carotid bruits and Normal upstroke. Inspection- Supple and Non-tender. Trachea- Midline. HeadInspection- Normocephalic, atraumatic.  Eye: PERRLA. Conjunctiva clear and Sclera non-icteric.  Ears: Assessment of hearing- Intact. Mouth and throat: Oral mucosa moist and Tongue midline.  Chest and Lung Exam Auscultation :Breath sounds- Clear to auscultation and Equal breath sounds bilateral. Inspection - Inspection of the chest reveals: Prominent veins - None present. Respiratory effort- No increase in respiratory effort.  Cardiovascular Exam- Auscultation reveals regular rate and rhythm without murmurs. Inspection - No JVD.  Abdomen Palpitation- Soft, non-tender, non-distended. Auscultation - Bowel sounds present and Abdominal bruit not present.  Peripheral Vascular Upper Extremity: Palpation:Radial pulse - Bilateral- 2+. Inspection of the upper extremities  reveals - Bilateral- Rapid capillary refill. Edema- Bilateral- Moderate. Lower Extremity: Palpation:Femoral Pulse- Bilateral- 2+. Dorsalis pedis pulse - Bilateral- 1+. Posterior tibial pulse - Bilateral- Not palpable.  Inspection of the lower extremities - Bilateral- Rapid capillary refill and Ulcerations present. Edema - Bilateral- Moderate and 2+ pitting. Venous Evaluation- Bilateral- Extensive varicosities present, Varicosities measure >1cm, Mild stasis dermatitis and Venous stasis ulceration(s) present. CEAP Classification:Clinical Classification- Bilateral- C6 - Active ulcer.  Neurologic Neurologic evaluation reveals - CN 2-12 grossly intact, grips equal, muscle strength in the upper and lower extremities intact and Sensation intact. Inspection of the upper extremities reveals - Equal strength bilateral. Inspection of the lower extremities reveals - Equal strength bilateral.  Neuropsychiatric Normal exam - Good historian with calm affect and A&Ox3 and Good insight.  Musculoskeletal Gait :- Uses walker to ambulate. Inspection of the upper extremities reveals: - No gross deformities. Inspection of the lower extremities reveals: - No gross deformities.  Assessment & Plan  1) Bilateral lower extremity edema with ulceration - worsening No improvement in condition of legs.  Serous weeping from bilateral legs. Ulcerations still present. Skin erythematous and foul smelling. Rewrapped and encouraged to come to office for wraps twice a week. Reviewed appropriate elevation as heart level or higher and its importance. Reviewed being active and its importance. Discussed the possibility of a lymphedema pump in the near future if symptoms can not be controlled with conservative therapy. Information on lymphedema pump given to patient. Patient scheduled for venous duplex on 03/01/15 to assess veins. Patient to follow up Thursday for wound check and rewrap. The patient and his  wife express their understanding of the plan.  2) Bilateral Cellulitis - worsening Keflex  one tab by mouth every six hours x 10 days called into pharmacy.  Attending Vascular Surgeon: Dr. Levora Dredge

## 2015-01-28 LAB — BASIC METABOLIC PANEL
Anion gap: 5 (ref 5–15)
BUN: 18 mg/dL (ref 6–20)
CALCIUM: 7.6 mg/dL — AB (ref 8.9–10.3)
CO2: 26 mmol/L (ref 22–32)
Chloride: 105 mmol/L (ref 101–111)
Creatinine, Ser: 0.9 mg/dL (ref 0.61–1.24)
GFR calc Af Amer: >60 mL/min (ref >60)
GFR calc non Af Amer: >60 mL/min (ref >60)
GLUCOSE: 91 mg/dL (ref 65–99)
Potassium: 3.9 mmol/L (ref 3.5–5.1)
Sodium: 136 mmol/L (ref 135–145)

## 2015-01-28 LAB — TYPE AND SCREEN
ABO/RH(D): A POS
ANTIBODY SCREEN: POSITIVE

## 2015-01-28 LAB — CBC
HEMATOCRIT: 39.6 % — AB (ref 40.0–52.0)
HEMOGLOBIN: 13.1 g/dL (ref 13.0–18.0)
MCH: 33.7 pg (ref 26.0–34.0)
MCHC: 33.2 g/dL (ref 32.0–36.0)
MCV: 101.5 fL — ABNORMAL HIGH (ref 80.0–100.0)
PLATELETS: 162 10*3/uL (ref 150–440)
RBC: 3.9 MIL/uL — ABNORMAL LOW (ref 4.40–5.90)
RDW: 16 % — ABNORMAL HIGH (ref 11.5–14.5)
WBC: 10.2 10*3/uL (ref 3.8–10.6)

## 2015-01-28 LAB — MAGNESIUM: Magnesium: 1.9 mg/dL (ref 1.7–2.4)

## 2015-01-28 MED ORDER — DIPHENHYDRAMINE HCL 25 MG PO CAPS
25.0000 mg | ORAL_CAPSULE | Freq: Four times a day (QID) | ORAL | Status: DC | PRN
  Administered 2015-01-29 – 2015-02-02 (×8): 25 mg via ORAL
  Filled 2015-01-28 (×9): qty 1

## 2015-01-28 MED ORDER — MORPHINE SULFATE 2 MG/ML IJ SOLN: 2.0000 mg | Freq: Four times a day (QID) | INTRAMUSCULAR | Status: DC | PRN

## 2015-01-28 MED ORDER — MORPHINE SULFATE 2 MG/ML IJ SOLN
2.0000 mg | INTRAMUSCULAR | Status: DC | PRN
  Administered 2015-01-29 – 2015-02-02 (×4): 2 mg via INTRAVENOUS
  Filled 2015-01-28 (×4): qty 1

## 2015-01-28 MED ORDER — ENSURE ENLIVE PO LIQD
237.0000 mL | Freq: Three times a day (TID) | ORAL | Status: DC
Start: 2015-01-28 — End: 2015-02-03
  Administered 2015-01-28 – 2015-02-03 (×15): 237 mL via ORAL

## 2015-01-28 MED ORDER — TRAMADOL HCL 50 MG PO TABS
100.0000 mg | ORAL_TABLET | Freq: Four times a day (QID) | ORAL | Status: DC | PRN
  Administered 2015-01-28 – 2015-02-02 (×7): 100 mg via ORAL
  Filled 2015-01-28 (×7): qty 2

## 2015-01-28 NOTE — Progress Notes (Signed)
[facility] - [place] at [facility]: Lambert Rhonda W   PATIENT NAME: Ronald Good    MR#:  [id number]  DATE OF BIRTH:  25-Apr-1934  SUBJECTIVE:  CHIEF COMPLAINT:  Infection on legs.  REVIEW OF SYSTEMS:  CONSTITUTIONAL: No fever, fatigue or weakness. Some weight loss. Feels cold. EYES: No blurred or double vision. Wears glasses. EARS, NOSE, AND THROAT: No tinnitus or ear pain. Decreased hearing. Positive for runny nose. Trouble swallowing last night at dinner. RESPIRATORY: No cough, shortness of breath, wheezing or hemoptysis.  CARDIOVASCULAR: No chest pain or palpitations.  GASTROINTESTINAL: No nausea, vomiting, diarrhea or abdominal pain. Some blood in the bowel movements when wiping only. GENITOURINARY: No dysuria, hematuria.  ENDOCRINE: No polyuria, nocturia,  HEMATOLOGY: No anemia, easy bruising or bleeding SKIN: Itching and scaling on hands and upper extremities. The redness and ulcerations on legs. MUSCULOSKELETAL: Pain in bilateral legs. NEUROLOGIC: No tingling, numbness, weakness.  PSYCHIATRY: No anxiety or depression  ROS  DRUG ALLERGIES:   Allergies  Allergen Reactions  . Oxycodone Other (See Comments)    "makes me loopy"  . Neomycin-Bacitracin Zn-Polymyx Rash    VITALS:  Blood pressure 108/74, pulse 98, temperature 97.5 F (36.4 C), temperature source Oral, resp. rate 16, height  (1.778 m), weight 88.587 kg (195 lb 4.8 oz), SpO2 94 %.  PHYSICAL EXAMINATION:  GENERAL: 81 y.o.-year-old patient lying in the bed with no acute distress.  EYES: Pupils equal, round, reactive to light and accommodation. No scleral icterus. Extraocular muscles intact.  HEENT: Head atraumatic, normocephalic. Oropharynx and nasopharynx clear.  NECK: Supple, no jugular venous distention. No thyroid enlargement, no tenderness.  LUNGS: Normal breath sounds bilaterally, no wheezing, rales,rhonchi or crepitation. No use of accessory muscles of respiration.   CARDIOVASCULAR: S1, S2 irregularly irregular, 2/6 systolic murmurs, no rubs, or gallops.  ABDOMEN: Soft, nontender, nondistended. Bowel sounds present. No organomegaly or mass.  EXTREMITIES: 3+ edema. No cyanosis, or clubbing.  NEUROLOGIC: Cranial nerves II through XII are intact. Muscle strength 5/5 in all extremities. Gait not checked.  PSYCHIATRIC: The patient is alert and oriented x 3.  SKIN: Legs were just wrapped. All of his toes are erythematous and swollen and scabbing. Upper shins have bilateral scabs. Hands have scaling and also erythematous.  Physical Exam LABORATORY PANEL:   CBC  Recent Labs Lab 01/28/15 0526  WBC 10.2  HGB 13.1  HCT 39.6*  PLT 162   ------------------------------------------------------------------------------------------------------------------  Chemistries   Recent Labs Lab 01/27/15 1249 01/28/15 0526  NA 134* 136  K 3.7 3.9  CL 101 105  CO2 28 26  GLUCOSE 90 91  BUN 17 18  CREATININE 0.94 0.90  CALCIUM 7.8* 7.6*  MG 1.8 1.9  AST 44*  --   ALT 21  --   ALKPHOS 139*  --   BILITOT 1.8*  --    ------------------------------------------------------------------------------------------------------------------  Cardiac Enzymes No results for input(s): TROPONINI in the last 168 hours. ------------------------------------------------------------------------------------------------------------------  RADIOLOGY:  No results found.  ASSESSMENT AND PLAN:   1. Bilateral lower extremity cellulitis and ulcerations- sent in by Dr. Gilda Crease for direct admission. He was started on IV Unasyn. ordered wound culture. Blood cultures. 2. Chronic systolic congestive heart failure-    no signs of heart failure at this time continue usual medications. 3. Atrial fibrillation    patient takes Pradaxa for anticoagulation and metoprolol, amiodarone for rate control. 4. Essential hypertension blood pressure stable on usual medications. 5.  Gastroesophageal reflux disease without esophagitis continue Protonix. 6. BPH without  urinary symptoms continue Flomax. 7. Scaling on hands can give Lotrisone ointment. 8. C/o itching all over the body- benadryl.   All the records are reviewed and case discussed with Care Management/Social Workerr. Management plans discussed with the patient, family and they are in agreement.  CODE STATUS: full  TOTAL TIME TAKING CARE OF THIS PATIENT: 35  minutes.   More than 50% of the visit was spent in counseling/coordination of care  POSSIBLE D/C IN 1-2 DAYS, DEPENDING ON CLINICAL CONDITION.   Altamese Dilling M.D on 01/28/2015   Between 7am to 6pm - Pager - [phone]  After 6pm go to [email or web address] - password [email or web address] [facility]  [facility]  Office  [phone]  CC: Primary care physician; Marisue Ivan, MD

## 2015-01-28 NOTE — Progress Notes (Signed)
Order for speech consult per  Dr. Esaw Grandchild

## 2015-01-28 NOTE — Care Management (Signed)
Direct admit from Vascular for bilateral cellulitis of lower extremities.  He presents from home.  He is currently in bedrest with bathroom privilege only.  He is currently on IV antibiotics and bid dressing changes.  During progression discussed the need to prevent functional decline.  As soon as medically clear for activity to ambulate patient and anticipate need for PT consult if indicated.  If able to return home directly from this hospitalization, would need to consider need for home health physical therapy and nursing (for wound care)

## 2015-01-28 NOTE — Evaluation (Signed)
Clinical/Bedside Swallow Evaluation Patient Details  Name: Ronald Good MRN: [id number] Date of Birth: [birth year]  Today's Date: 01/28/2015 Time: SLP Start Time (ACUTE ONLY): 1655 SLP Stop Time (ACUTE ONLY): 1755 SLP Time Calculation (min) (ACUTE ONLY): 60 min  Past Medical History:  Past Medical History  Diagnosis Date  . Chronic systolic congestive heart failure   . BPH (benign prostatic hyperplasia)   . Atrial fibrillation   . Essential hypertension   . Gastroesophageal reflux disease    Past Surgical History:  Past Surgical History  Procedure Laterality Date  . Replacement total knee bilateral Bilateral    HPI:  Pt c/o "trouble swallowing" at his meal last evening per MD note. Pt explained that he feels food "stops here" pointing to his lower throat area; he stated during po trials 1x "it's trying to come back up right now". Pt has 2-3 vomit bags around him in the bed and acknowledged he has to "use them for the phlegm". Pt stated he eats a "regular diet at home". He was able to describe softer foods. He denied having seen a GI MD for any Esophageal motility issues and stated he does not take any medication for his stomach(pt does have Pantoprazole prescribed per chart note). Noted wounds on his LEs. MD arrived stated pt's protein level was low.    Assessment / Plan / Recommendation Clinical Impression  Pt appears to be able to adequately manage po trials of thin liquids and soft solids w/ no overt, s/s of aspiration noted during po trials at this eval. Pt does endorse s/s of Esophageal dysmotility w/ foods, esp. solids such as meats and breads. This may be the impact of the swallowing "trouble" he is feeling when eating at meals. Pt denies any medication for GI issues or any visits to the GI MD(ie., esophageal stricture/dilitation; reflux). Pt would be at increased risk for aspiration of regurgitated material. Rec. a modified diet of Dys. 2 w/ thin liquids w/ aspiration and  Reflux precautions. Rec. ST f/u w/ toleration of diet while admitted.      Aspiration Risk  Mild    Diet Recommendation Dysphagia 2 (Fine chop);Thin   Medication Administration: Whole meds with puree Compensations: Slow rate;Small sips/bites    Other  Recommendations Recommended Consults: Consider GI evaluation (Dietician consult) Oral Care Recommendations: Oral care BID;Oral care before and after PO;Staff/trained caregiver to provide oral care   Follow Up Recommendations       Frequency and Duration min 3x week  1 week   Pertinent Vitals/Pain denied    SLP Swallow Goals  see care plan   Swallow Study Prior Functional Status   lived at home; pt reported prior Esophageal dysmotility s/s at home    General Date of Onset: 01/27/15 Other Pertinent Information: Pt c/o "trouble swallowing" at his meal last evening per MD note. Pt explained that he feels food "stops here" pointing to his lower throat area; he stated during po trials 1x "it's trying to come back up right now". Pt has 2-3 vomit bags around him in the bed and acknowledged he has to "use them for the phlegm". Pt stated he eats a "regular diet at home". He was able to describe softer foods. He denied having seen a GI MD for any Esophageal motility issues and stated he does not take any medication for his stomach(pt does have Pantoprazole prescribed per chart note). Noted wounds on his LEs. MD arrived stated pt's protein level was low.  Type of Study:  Bedside swallow evaluation Previous Swallow Assessment: none indicated Diet Prior to this Study: Regular;Thin liquids Temperature Spikes Noted: No Respiratory Status: Room air (WBC 10.3 at admission) History of Recent Intubation: No Behavior/Cognition: Alert;Cooperative;Pleasant mood;Requires cueing Oral Cavity - Dentition: Missing dentition Self-Feeding Abilities: Able to feed self;Needs set up Patient Positioning: Upright in bed Baseline Vocal Quality: Normal;Low vocal  intensity Volitional Cough: Strong Volitional Swallow: Able to elicit    Oral/Motor/Sensory Function Overall Oral Motor/Sensory Function: Appears within functional limits for tasks assessed Labial ROM: Within Functional Limits Labial Symmetry: Within Functional Limits Labial Strength: Within Functional Limits Lingual ROM: Within Functional Limits Lingual Symmetry: Within Functional Limits Lingual Strength: Within Functional Limits Facial Symmetry: Within Functional Limits Velum: Within Functional Limits Mandible: Within Functional Limits   Ice Chips Ice chips: Not tested (pt eating his dinner meal)   Thin Liquid Thin Liquid: Within functional limits Presentation: Self Fed;Cup (x6 trials)    Nectar Thick Nectar Thick Liquid: Not tested   Honey Thick Honey Thick Liquid: Not tested   Puree Puree: Within functional limits Presentation: Self Fed;Spoon (x5 trials)   Solid   GO    Solid: Within functional limits Presentation: Self Fed;Spoon (x2 trials)       Watson,Katherine 01/28/2015,6:32 PM

## 2015-01-28 NOTE — Progress Notes (Signed)
Per Dr. Esaw Grandchild reorder wound culture as well as put in order for Morphine 2mg  IV q 6hrs as needed for pain.

## 2015-01-28 NOTE — Progress Notes (Signed)
[facility]  Daily Progress Note   Subjective  -the patient feels significantly improved today he denies leg pain at this time. He has not been soaking through his dressings. As a secondary and new issue he has been having significant disability with eating and is currently undergoing swallowing evaluation   Objective Filed Vitals:   01/28/15 0752 01/28/15 1203 01/28/15 1637 01/28/15 1817  BP: 108/57 108/74 102/69 110/77  Pulse: 98  76 70  Temp: 97.5 F (36.4 C)     TempSrc: Oral     Resp: 16  17   Height:      Weight:      SpO2: 94%       Intake/Output Summary (Last 24 hours) at 01/28/15 1848 Last data filed at 01/28/15 1723  Gross per 24 hour  Intake    290 ml  Output    875 ml  Net   -585 ml    PULM  Normal effort , no use of accessory muscles CV  No JVD, RRR Abd      No distended, nontender VASC  legs demonstrated a dramatic reduction in edema with crusting and significant improvement in his wounds overall.  Laboratory CBC    Component Value Date/Time   WBC 10.2 01/28/2015 0526   WBC 9.2 07/31/2014 0611   HGB 13.1 01/28/2015 0526   HGB 12.4* 07/31/2014 0611   HCT 39.6* 01/28/2015 0526   HCT 37.2* 07/31/2014 0611   PLT 162 01/28/2015 0526   PLT 153 07/31/2014 0611    BMET    Component Value Date/Time   NA 136 01/28/2015 0526   NA 138 08/02/2014 0424   K 3.9 01/28/2015 0526   K 3.6 08/02/2014 0424   CL 105 01/28/2015 0526   CL 103 08/02/2014 0424   CO2 26 01/28/2015 0526   CO2 28 08/02/2014 0424   GLUCOSE 91 01/28/2015 0526   GLUCOSE 89 08/02/2014 0424   BUN 18 01/28/2015 0526   BUN 10 08/02/2014 0424   CREATININE 0.90 01/28/2015 0526   CREATININE 0.90 08/02/2014 0424   CALCIUM 7.6* 01/28/2015 0526   CALCIUM 8.3* 08/02/2014 0424   GFRNONAA >60 01/28/2015 0526   GFRNONAA >60 03/24/2014 0532   GFRAA >60 01/28/2015 0526   GFRAA >60 03/24/2014 0532    Assessment/Planning:  1) Bilateral lower extremity edema with ulceration  - markedly improved compared to yesterday  Ulcerations still present. Skin is less erythematous and the foul smell is now gone. Rewrapped and encouraged to come to office for wraps twice a week. Reviewed appropriate elevation as heart level or higher and its importance. Reviewed being active and its importance. Patient will continue antibiotics and bed rest with minimal time out of bed therefore minimizing dependent positioning of his feet The patient and his wife express their understanding of the plan.  2) Bilateral Cellulitis - improving Continue Unasyn   3)  dysphagia  Swallowing eval ongoing he does appear to have problems with solids he will require further evaluation by GI he was started on a dysphagia diet with supplements of ensure.  Renford Dills  01/28/2015, 6:48 PM

## 2015-01-29 LAB — MAGNESIUM: MAGNESIUM: 1.8 mg/dL (ref 1.7–2.4)

## 2015-01-29 MED ORDER — HYDROXYZINE HCL 10 MG PO TABS
10.0000 mg | ORAL_TABLET | Freq: Three times a day (TID) | ORAL | Status: DC
  Administered 2015-01-29 – 2015-02-03 (×15): 10 mg via ORAL
  Filled 2015-01-29 (×16): qty 1

## 2015-01-29 MED ORDER — HYDROCORTISONE 1 % EX CREA
TOPICAL_CREAM | Freq: Two times a day (BID) | CUTANEOUS | Status: DC
  Administered 2015-01-29 – 2015-01-30 (×3): via TOPICAL
  Administered 2015-01-30: 1 via TOPICAL
  Administered 2015-01-31 – 2015-02-01 (×4): via TOPICAL
  Filled 2015-01-29 (×2): qty 28

## 2015-01-29 NOTE — Progress Notes (Signed)
Notified Dr Desiree Hane of pt request for low fat/low salt/low cholesterol diet. Dr ordered

## 2015-01-29 NOTE — Progress Notes (Addendum)
[facility]  Daily Progress Note  Subjective:  Patient still complaining of "itching" all over, reminded he can have benadryl. Patient forgetful at times. Patient underwent swallow study - may have an esophogeal dysmotility issue, will consult GI for their recommendations. Patient undergoing twice daily lower extremity dressing changes with Silvadene. Patient states he is no longer draining through dressings. He is refusing to elevate his legs as instructed. I reinforced the need for elevation and how it will help his legs heal faster - he expressed his understanding.   Objective: Filed Vitals:   01/28/15 2331 01/29/15 0559 01/29/15 0745 01/29/15 1046  BP: 97/65  106/71 105/71  Pulse: 76  80 67  Temp: 97.7 F (36.5 C)  97.6 F (36.4 C)   TempSrc: Oral  Oral   Resp: 16  18   Height:      Weight:  88.814 kg (195 lb 12.8 oz)    SpO2: 96%  96%     Intake/Output Summary (Last 24 hours) at 01/29/15 1201 Last data filed at 01/29/15 1111  Gross per 24 hour  Intake  271.8 ml  Output   1150 ml  Net -878.2 ml    Physical Exam: Confused at times, NAD CV: RRR Pulmonary: CTA Bilaterally Abdomen: Soft, Nontender, Nondistended Bilateral Lower Extremity: I was present during dressing change. Improvement in edema and erythema bilaterally. No foul odor noted anymore. Weeping has dramatically improved. Ulcerations are stable.    Laboratory: CBC    Component Value Date/Time   WBC 10.2 01/28/2015 0526   WBC 9.2 07/31/2014 0611   HGB 13.1 01/28/2015 0526   HGB 12.4* 07/31/2014 0611   HCT 39.6* 01/28/2015 0526   HCT 37.2* 07/31/2014 0611   PLT 162 01/28/2015 0526   PLT 153 07/31/2014 0611    BMET    Component Value Date/Time   NA 136 01/28/2015 0526   NA 138 08/02/2014 0424   K 3.9 01/28/2015 0526   K 3.6 08/02/2014 0424   CL 105 01/28/2015 0526   CL 103 08/02/2014 0424   CO2 26 01/28/2015 0526   CO2 28 08/02/2014 0424   GLUCOSE 91 01/28/2015 0526   GLUCOSE 89 08/02/2014 0424   BUN 18 01/28/2015 0526   BUN 10 08/02/2014 0424   CREATININE 0.90 01/28/2015 0526   CREATININE 0.90 08/02/2014 0424   CALCIUM 7.6* 01/28/2015 0526   CALCIUM 8.3* 08/02/2014 0424   GFRNONAA >60 01/28/2015 0526   GFRNONAA >60 03/24/2014 0532   GFRAA >60 01/28/2015 0526   GFRAA >60 03/24/2014 0532    Assessment/Planning: Patient admitted from office with bilateral lymphedema exacerbation with cellulitis and ulceration.  1) Continue twice daily dressing changes with silvadene cream. 2) Please encourage patient to elevate legs.  3) Will consult GI for possible esophageal dysmotility. 4) Continue Unasyn for cellulitis. 5) AM Labs 6) Patient may ambulate with assistance. Please do not let patient sit in chair with legs hanging down, please elevate his legs as much as possible.   Cleda Daub PA-C 01/29/2015 12:01 PM

## 2015-01-29 NOTE — Evaluation (Signed)
Physical Therapy Evaluation Patient Details Name: Ronald Good MRN: [id number] DOB: July 27, 1934 Today's Date: 01/29/2015   History of Present Illness  Patient is a 81 y.o. male with hx of cellulitis. Patient was at Dr. Colman Cater office for [name] boot change and sent to ER. Patient has PMH of CHF, PVD, AAA, hypercholesterolemia. Has hx of fall with hip fracture and THA.  Clinical Impression  Patient has had gradually worsening infection in B LEs that has limited his ability to perform transfers and ambulate. Patient demonstrated decreased safety awareness of use of assistive device during evaluation, having tendency to lean posteriorly causing the RW to tip. Gait was unable to be assessed due to amount of assistance patient was requiring during weightbearing. Patient has family available to help; however, they are unable to provide him with the level of assistance that he is requiring at this time. Patient will benefit from skilled PT at a SNF until he returns to his PLOF.    Follow Up Recommendations SNF    Equipment Recommendations       Recommendations for Other Services       Precautions / Restrictions Precautions Precautions: Fall Restrictions Weight Bearing Restrictions: No      Mobility  Bed Mobility Overal bed mobility: Needs Assistance Bed Mobility: Supine to Sit;Sit to Supine     Supine to sit: Min assist (Verbal cues for scooting technique) Sit to supine: Min assist (Patient required minimal assistance lifting LEs onto bed.)      Transfers Overall transfer level: Needs assistance Equipment used: Rolling walker (2 wheeled) Transfers: Sit to/from [employer] Sit to Stand: Mod assist Stand pivot transfers: Max assist       General transfer comment: Patient required verbal cues for proper sequencing and safety awareness. Had tendency to lean LEs against bed and backwards lean at trunk, causing RW to tip. PT had to assist patient into upright  standing while stabilizing the RW. Would have required more assistance for ambulation.  Ambulation/Gait                Stairs            Wheelchair Mobility    Modified Rankin (Stroke Patients Only)       Balance Overall balance assessment: Needs assistance Sitting-balance support: Bilateral upper extremity supported Sitting balance-Leahy Scale: Fair   Postural control: Other (comment) (Posterior trunk lean) Standing balance support: Bilateral upper extremity supported Standing balance-Leahy Scale: Poor Standing balance comment: Patient had tendency to lean backwards, tipping RW.                             Pertinent Vitals/Pain Pain Assessment: 0-10 Pain Score: 9  Pain Location: B LE Pain Descriptors / Indicators: Aching (Itching) Pain Intervention(s): Monitored during session    Home Living Family/patient expects to be discharged to:: Private residence Living Arrangements: Spouse/significant other Available Help at Discharge: Family (Wife and daughter) Type of Home: House Home Access: Ramped entrance     Home Layout: One level Home Equipment: Environmental consultant - 2 wheels;Toilet riser;Shower seat - built in Building services engineer)      Prior Function Level of Independence: Needs assistance   Gait / Transfers Assistance Needed: Patient was ambulating and performing transfers with RW for household distances.  ADL's / Homemaking Assistance Needed: Patient's wife lives with him. Daughter visits 2-3 hours daily to assist. Daughter buys groceries for parents but works full time.  Hand Dominance        Extremity/Trunk Assessment   Upper Extremity Assessment: Overall WFL for tasks assessed           Lower Extremity Assessment: Generalized weakness;LLE deficits/detail;RLE deficits/detail RLE Deficits / Details: Patient's R LE is wrapped in dressing LLE Deficits / Details: Patient's L LE was wrapped with dressing  Cervical / Trunk Assessment:  Kyphotic  Communication   Communication: No difficulties  Cognition Arousal/Alertness: Awake/alert Behavior During Therapy: WFL for tasks assessed/performed Overall Cognitive Status: Within Functional Limits for tasks assessed                      General Comments      Exercises        Assessment/Plan    PT Assessment Patient needs continued PT services  PT Diagnosis Difficulty walking;Generalized weakness;Acute pain   PT Problem List Decreased strength;Decreased range of motion;Decreased activity tolerance;Decreased balance;Decreased mobility;Decreased knowledge of use of DME;Decreased safety awareness;Pain;Decreased skin integrity  PT Treatment Interventions DME instruction;Gait training;Functional mobility training;Therapeutic exercise;Therapeutic activities;Balance training;Patient/family education   PT Goals (Current goals can be found in the Care Plan section) Acute Rehab PT Goals Patient Stated Goal: "I want to stop itching so bad." PT Goal Formulation: With patient Time For Goal Achievement: 02/12/15 Potential to Achieve Goals: Good    Frequency Min 2X/week   Barriers to discharge Decreased caregiver support Patient has caregiver support; however, he is not at his baseline level of household ambulation.    Co-evaluation               End of Session Equipment Utilized During Treatment: Gait belt Activity Tolerance: Patient tolerated treatment well;Patient limited by pain Patient left: in chair;with call bell/phone within reach;with chair alarm set;with family/visitor present           Time: 1545-1630 PT Time Calculation (min) (ACUTE ONLY): 45 min   Charges:   PT Evaluation $Initial PT Evaluation Tier I: 1 Procedure     PT G Codes:        Neita Carp, PT, DPT 01/29/2015, 4:36 PM

## 2015-01-29 NOTE — Progress Notes (Signed)
Hold Metopolol per Dr. Desiree Hane

## 2015-01-29 NOTE — Progress Notes (Signed)
[facility] - [place] at [facility]   PATIENT NAME: Ronald Good    MR#:  [id number]  DATE OF BIRTH:  26-Jan-1934  SUBJECTIVE:  CHIEF COMPLAINT:  Infection on legs. excessive itching on chest and back.  REVIEW OF SYSTEMS:  CONSTITUTIONAL: No fever, fatigue or weakness. Some weight loss. Feels cold. EYES: No blurred or double vision. Wears glasses. EARS, NOSE, AND THROAT: No tinnitus or ear pain. Decreased hearing. Positive for runny nose. Trouble swallowing last night at dinner. RESPIRATORY: No cough, shortness of breath, wheezing or hemoptysis.  CARDIOVASCULAR: No chest pain or palpitations.  GASTROINTESTINAL: No nausea, vomiting, diarrhea or abdominal pain. Some blood in the bowel movements when wiping only. GENITOURINARY: No dysuria, hematuria.  ENDOCRINE: No polyuria, nocturia,  HEMATOLOGY: No anemia, easy bruising or bleeding SKIN: Itching and scaling on hands and upper extremities. The redness and ulcerations on legs. MUSCULOSKELETAL: Pain in bilateral legs. NEUROLOGIC: No tingling, numbness, weakness.  PSYCHIATRY: No anxiety or depression  ROS  DRUG ALLERGIES:   Allergies  Allergen Reactions  . Oxycodone Other (See Comments)    "makes me loopy"  . Neomycin-Bacitracin Zn-Polymyx Rash    VITALS:  Blood pressure 105/71, pulse 67, temperature 97.6 F (36.4 C), temperature source Oral, resp. rate 18, height  (1.778 m), weight 88.814 kg (195 lb 12.8 oz), SpO2 96 %.  PHYSICAL EXAMINATION:  GENERAL: 81 y.o.-year-old patient lying in the bed with no acute distress.  EYES: Pupils equal, round, reactive to light and accommodation. No scleral icterus. Extraocular muscles intact.  HEENT: Head atraumatic, normocephalic. Oropharynx and nasopharynx clear.  NECK: Supple, no jugular venous distention. No thyroid enlargement, no tenderness.  LUNGS: Normal breath sounds bilaterally, no wheezing, rales,rhonchi or crepitation. No use of  accessory muscles of respiration.  CARDIOVASCULAR: S1, S2 irregularly irregular, 2/6 systolic murmurs, no rubs, or gallops.  ABDOMEN: Soft, nontender, nondistended. Bowel sounds present. No organomegaly or mass.  EXTREMITIES: 3+ edema. No cyanosis, or clubbing.  NEUROLOGIC: Cranial nerves II through XII are intact. Muscle strength 5/5 in all extremities. Gait not checked.  PSYCHIATRIC: The patient is alert and oriented x 3.  SKIN: Legs were just wrapped. All of his toes are erythematous and swollen and scabbing. Upper shins have bilateral scabs. Hands have scaling and also erythematous.  Physical Exam LABORATORY PANEL:   CBC  Recent Labs Lab 01/28/15 0526  WBC 10.2  HGB 13.1  HCT 39.6*  PLT 162   ------------------------------------------------------------------------------------------------------------------  Chemistries   Recent Labs Lab 01/27/15 1249 01/28/15 0526  NA 134* 136  K 3.7 3.9  CL 101 105  CO2 28 26  GLUCOSE 90 91  BUN 17 18  CREATININE 0.94 0.90  CALCIUM 7.8* 7.6*  MG 1.8 1.9  AST 44*  --   ALT 21  --   ALKPHOS 139*  --   BILITOT 1.8*  --    ------------------------------------------------------------------------------------------------------------------  Cardiac Enzymes No results for input(s): TROPONINI in the last 168 hours. ------------------------------------------------------------------------------------------------------------------  RADIOLOGY:  No results found.  ASSESSMENT AND PLAN:   1. Bilateral lower extremity cellulitis and ulcerations- sent in by Dr. Gilda Crease for direct admission. He was started on IV Unasyn. ordered wound culture. Blood cultures. Improved legs. 2. Chronic systolic congestive heart failure-    no signs of heart failure at this time continue usual medications. 3. Atrial fibrillation    patient takes Pradaxa for anticoagulation and metoprolol, amiodarone for rate control. 4. Essential hypertension blood  pressure stable on usual medications. 5. Gastroesophageal reflux  disease without esophagitis continue Protonix. 6. BPH without urinary symptoms continue Flomax. 7. Scaling on hands can give Lotrisone ointment. 8. C/o itching all over the body- benadryl.   Add atarax and hycortison cream today.   All the records are reviewed and case discussed with Care Management/Social Workerr. Management plans discussed with the patient, family and they are in agreement.  CODE STATUS: full  TOTAL TIME TAKING CARE OF THIS PATIENT: 35  minutes.   More than 50% of the visit was spent in counseling/coordination of care  POSSIBLE D/C IN 1-2 DAYS, DEPENDING ON CLINICAL CONDITION.   Altamese Dilling M.D on 01/29/2015   Between 7am to 6pm - Pager - [phone]  After 6pm go to [email or web address] - password [email or web address] [facility]  [place] [facility]  Office  [phone]  CC: Primary care physician; Marisue Ivan, MD

## 2015-01-30 ENCOUNTER — Encounter: Payer: Self-pay | Admitting: Gastroenterology

## 2015-01-30 LAB — BASIC METABOLIC PANEL
ANION GAP: 4 — AB (ref 5–15)
BUN: 22 mg/dL — ABNORMAL HIGH (ref 6–20)
CHLORIDE: 100 mmol/L — AB (ref 101–111)
CO2: 32 mmol/L (ref 22–32)
Calcium: 7.5 mg/dL — ABNORMAL LOW (ref 8.9–10.3)
Creatinine, Ser: 1.02 mg/dL (ref 0.61–1.24)
GFR calc Af Amer: >60 mL/min (ref >60)
GFR calc non Af Amer: >60 mL/min (ref >60)
Glucose, Bld: 90 mg/dL (ref 65–99)
Potassium: 2.7 mmol/L — CL (ref 3.5–5.1)
Sodium: 136 mmol/L (ref 135–145)

## 2015-01-30 LAB — CBC
HEMATOCRIT: 41.7 % (ref 40.0–52.0)
HEMOGLOBIN: 13.9 g/dL (ref 13.0–18.0)
MCH: 33.8 pg (ref 26.0–34.0)
MCHC: 33.3 g/dL (ref 32.0–36.0)
MCV: 101.3 fL — AB (ref 80.0–100.0)
PLATELETS: 170 10*3/uL (ref 150–440)
RBC: 4.12 MIL/uL — AB (ref 4.40–5.90)
RDW: 16 % — ABNORMAL HIGH (ref 11.5–14.5)
WBC: 10.6 10*3/uL (ref 3.8–10.6)

## 2015-01-30 MED ORDER — POTASSIUM CHLORIDE IN NACL 40-0.9 MEQ/L-% IV SOLN
INTRAVENOUS | Status: AC
  Administered 2015-01-30: 75 mL/h via INTRAVENOUS
  Filled 2015-01-30: qty 1000

## 2015-01-30 MED ORDER — POTASSIUM CHLORIDE CRYS ER 20 MEQ PO TBCR
40.0000 meq | EXTENDED_RELEASE_TABLET | Freq: Two times a day (BID) | ORAL | Status: DC
  Administered 2015-01-30 – 2015-02-03 (×9): 40 meq via ORAL
  Filled 2015-01-30 (×9): qty 2

## 2015-01-30 NOTE — Care Management Note (Signed)
Case Management Note  Patient Details  Name: Ronald Good MRN: [id number] Date of Birth: 03/28/1934  Subjective/Objective:     80yo Ronald Good was admitted from home per bilateral lower extremity cellulitis. Lives at home with his wife Ronald Good and has been having his [employer] changed twice at week at his physician's office. Today this writer observed [facility] nurses doing his dressing changes, numerous open, painful, weeping wounds from knee to toes. Ronald Desha's nurse is pursuing obtaining an order for a Wound Care Consult. [facility] PT is recommending SNF/Rehab and a consult was made today for a Social Work evaluation for SNF. Ronald Manger states that he wants to return home but reports that his wife wants him to go to rehab until his wounds and ability to ambulate improve. Ronald Bethke has a rolling walker and a wheel chair at home. Pharmacy=[facility] on [address]. PCP is Dr Tomasa Rand.  Case management will follow for discharge planning if Ronald Dory is not discharged to SNF.               Action/Plan:   Expected Discharge Date:  01/29/15               Expected Discharge Plan:     In-House Referral:     Discharge planning Services     Post Acute Care Choice:    Choice offered to:     DME Arranged:    DME Agency:     HH Arranged:    HH Agency:     Status of Service:     Medicare Important Message Given:  Yes Date Medicare IM Given:  01/28/15 Medicare IM give by:  Gwenette Greet RN MSN Date Additional Medicare IM Given:    Additional Medicare Important Message give by:     If discussed at Long Length of Stay Meetings, dates discussed:    Additional Comments:  Marilouise Densmore A, RN 01/30/2015, 11:42 AM

## 2015-01-30 NOTE — Progress Notes (Signed)
[facility] Physicians - [place] at [facility]   PATIENT NAME: Ronald Good    MR#:  [id number]  DATE OF BIRTH:  1933/09/11  SUBJECTIVE:  CHIEF COMPLAINT:  Infection on legs. excessive itching on chest and back.  REVIEW OF SYSTEMS:  CONSTITUTIONAL: No fever, fatigue or weakness. Some weight loss. Feels cold. EYES: No blurred or double vision. Wears glasses. EARS, NOSE, AND THROAT: No tinnitus or ear pain. Decreased hearing. Positive for runny nose. Trouble swallowing last night at dinner. RESPIRATORY: No cough, shortness of breath, wheezing or hemoptysis.  CARDIOVASCULAR: No chest pain or palpitations.  GASTROINTESTINAL: No nausea, vomiting, diarrhea or abdominal pain. Some blood in the bowel movements when wiping only. GENITOURINARY: No dysuria, hematuria.  ENDOCRINE: No polyuria, nocturia,  HEMATOLOGY: No anemia, easy bruising or bleeding SKIN: Itching and scaling on hands and upper extremities. The redness and ulcerations on legs. MUSCULOSKELETAL: Pain in bilateral legs. NEUROLOGIC: No tingling, numbness, weakness.  PSYCHIATRY: No anxiety or depression  ROS  DRUG ALLERGIES:   Allergies  Allergen Reactions  . Oxycodone Other (See Comments)    "makes me loopy"  . Neomycin-Bacitracin Zn-Polymyx Rash    VITALS:  Blood pressure 100/60, pulse 85, temperature 97.7 F (36.5 C), temperature source Oral, resp. rate 18, height  (1.778 m), weight 85.276 kg (188 lb), SpO2 96 %.  PHYSICAL EXAMINATION:  GENERAL: 81 y.o.-year-old patient lying in the bed with no acute distress.  EYES: Pupils equal, round, reactive to light and accommodation. No scleral icterus. Extraocular muscles intact.  HEENT: Head atraumatic, normocephalic. Oropharynx and nasopharynx clear.  NECK: Supple, no jugular venous distention. No thyroid enlargement, no tenderness.  LUNGS: Normal breath sounds bilaterally, no wheezing, rales,rhonchi or crepitation. No use of accessory  muscles of respiration.  CARDIOVASCULAR: S1, S2 irregularly irregular, 2/6 systolic murmurs, no rubs, or gallops.  ABDOMEN: Soft, nontender, nondistended. Bowel sounds present. No organomegaly or mass.  EXTREMITIES: 3+ edema. No cyanosis, or clubbing.  NEUROLOGIC: Cranial nerves II through XII are intact. Muscle strength 5/5 in all extremities. Gait not checked.  PSYCHIATRIC: The patient is alert and oriented x 3.  SKIN: Legs were just wrapped. All of his toes are erythematous and swollen and scabbing. Upper shins have bilateral scabs. Hands have scaling and also erythematous.  Physical Exam LABORATORY PANEL:   CBC  Recent Labs Lab 01/30/15 0502  WBC 10.6  HGB 13.9  HCT 41.7  PLT 170   ------------------------------------------------------------------------------------------------------------------  Chemistries   Recent Labs Lab 01/27/15 1249  01/29/15 1259 01/30/15 0502  NA 134*  < >  --  136  K 3.7  < >  --  2.7*  CL 101  < >  --  100*  CO2 28  < >  --  32  GLUCOSE 90  < >  --  90  BUN 17  < >  --  22*  CREATININE 0.94  < >  --  1.02  CALCIUM 7.8*  < >  --  7.5*  MG 1.8  < > 1.8  --   AST 44*  --   --   --   ALT 21  --   --   --   ALKPHOS 139*  --   --   --   BILITOT 1.8*  --   --   --   < > = values in this interval not displayed. ------------------------------------------------------------------------------------------------------------------  Cardiac Enzymes No results for input(s): TROPONINI in the last 168 hours. ------------------------------------------------------------------------------------------------------------------  RADIOLOGY:  No results found.  ASSESSMENT AND PLAN:   1. Bilateral lower extremity cellulitis and ulcerations- sent in by Dr. Gilda Crease for direct admission. He was started on IV Unasyn. ordered wound culture. Blood cultures. Improved legs. Follow per primary team- vascular. 2. Chronic systolic congestive heart failure-    no  signs of heart failure at this time continue usual medications. 3. Atrial fibrillation    patient takes Pradaxa for anticoagulation and metoprolol, amiodarone for rate control. 4. Essential hypertension blood pressure stable on usual medications. 5. Gastroesophageal reflux disease without esophagitis continue Protonix. 6. BPH without urinary symptoms continue Flomax. 7. Scaling on hands can give Lotrisone ointment. 8. C/o itching all over the body- benadryl.   Add atarax and hycortison cream today. 9. Generalized weakness  PT suggested SNF- will have to arrange for it on monday.  All the records are reviewed and case discussed with Care Management/Social Workerr. Management plans discussed with the patient, family and they are in agreement.  CODE STATUS: full  TOTAL TIME TAKING CARE OF THIS PATIENT: 35  minutes.   More than 50% of the visit was spent in counseling/coordination of care  POSSIBLE D/C IN 1-2 DAYS, DEPENDING ON CLINICAL CONDITION.   Altamese Dilling M.D on 01/30/2015   Between 7am to 6pm - Pager - [phone]  After 6pm go to [email or web address] - password [email or web address] [facility]  [place] [facility]  Office  [phone]  CC: Primary care physician; Marisue Ivan, MD

## 2015-01-30 NOTE — Consult Note (Signed)
GI Inpatient Consult Note  Reason for Consult: Dysphagia.   Attending Requesting Consult:  History of Present Illness: Ronald Good is a 81 y.o. male admitted for treatment of bilateral cellulitis. In hospital, had 1-2 episode of dysphagia to solids with some vomiting. Was evaluated by speech therapy on 6/17. Felt to have increased risk of aspiration. Recommended dysphagia diet. Pt does not remember speech therapy evaluation. Pt insists on having NO UGI sxs prior to admission. No heartburn, nausea, vomiting, wt loss, anorexia prior to admission. On pradexa for chronic Afib.  Past Medical History:  Past Medical History  Diagnosis Date  . Chronic systolic congestive heart failure   . BPH (benign prostatic hyperplasia)   . Atrial fibrillation   . Essential hypertension   . Gastroesophageal reflux disease     Problem List: Patient Active Problem List   Diagnosis Date Noted  . Essential (primary) hypertension 01/27/2015  . Cellulitis and abscess of lower extremity 01/27/2015  . Breathlessness on exertion 12/02/2014  . Acute on chronic systolic heart failure 07/07/2014  . Atrial fibrillation, chronic 12/07/2013  . Benign prostatic hyperplasia with urinary obstruction 03/10/2013  . Calculus of kidney 03/10/2013  . FOM (frequency of micturition) 03/10/2013  . Delayed onset of urination 03/10/2013    Past Surgical History: Past Surgical History  Procedure Laterality Date  . Replacement total knee bilateral Bilateral     Allergies: Allergies  Allergen Reactions  . Oxycodone Other (See Comments)    "makes me loopy"  . Neomycin-Bacitracin Zn-Polymyx Rash    Home Medications: Prescriptions prior to admission  Medication Sig Dispense Refill Last Dose  . amiodarone (PACERONE) 200 MG tablet Take 200 mg by mouth daily.   01/26/2015 at Unknown time  . dabigatran (PRADAXA) 150 MG CAPS capsule Take 150 mg by mouth 2 (two) times daily.   01/26/2015 at Unknown time  . furosemide  (LASIX) 20 MG tablet Take 20 mg by mouth 2 (two) times daily.   01/26/2015 at Unknown time  . metolazone (ZAROXOLYN) 5 MG tablet Take 5 mg by mouth daily. Take 30 minutes before AM lasix   01/26/2015 at Unknown time  . metoprolol succinate (TOPROL-XL) 50 MG 24 hr tablet Take 25 mg by mouth daily.   01/26/2015 at Unknown time  . pantoprazole (PROTONIX) 20 MG tablet Take 20 mg by mouth daily.     . tamsulosin (FLOMAX) 0.4 MG CAPS capsule Take 0.4 mg by mouth daily.   01/26/2015 at Unknown time  . traMADol (ULTRAM) 50 MG tablet Take 50 mg by mouth 4 (four) times daily as needed. As needed for pain   01/26/2015 at Unknown time  . albuterol (VENTOLIN HFA) 108 (90 BASE) MCG/ACT inhaler Inhale 108 mcg into the lungs every 4 (four) hours as needed. 2 inhalations into lungs every four hours as needed for wheezing   Not Taking at Unknown time   Home medication reconciliation was completed with the patient.   Scheduled Inpatient Medications:   . amiodarone  200 mg Oral Daily  . ampicillin-sulbactam (UNASYN) IV  3 g Intravenous Q6H  . clotrimazole-betamethasone   Topical BID  . dabigatran  150 mg Oral BID  . feeding supplement (ENSURE ENLIVE)  237 mL Oral TID BM  . furosemide  20 mg Oral BID  . hydrocortisone cream   Topical BID  . hydrOXYzine  10 mg Oral TID  . metolazone  5 mg Oral QAC breakfast  . metoprolol succinate  25 mg Oral Daily  . pantoprazole  40 mg Oral Daily  . potassium chloride  40 mEq Oral BID  . silver sulfADIAZINE   Topical BID  . tamsulosin  0.4 mg Oral Daily    Continuous Inpatient Infusions:   . 0.9 % NaCl with KCl 40 mEq / L 75 mL/hr (01/30/15 1029)    PRN Inpatient Medications:  acetaminophen **OR** acetaminophen, albuterol, diphenhydrAMINE, morphine injection, traMADol  Family History: family history is not on file.  The patient's family history is negative for inflammatory bowel disorders, GI malignancy, or solid organ transplantation.  Social History:   reports that  he has never smoked. He does not have any smokeless tobacco history on file. He reports that he does not drink alcohol or use illicit drugs. The patient denies ETOH, tobacco, or drug use.   Review of Systems: Constitutional: Weight is stable.  Eyes: No changes in vision. ENT: No oral lesions, sore throat.  GI: see HPI.  Heme/Lymph: No easy bruising.  CV: No chest pain.  GU: No hematuria.  Integumentary: No rashes.  Neuro: No headaches.  Psych: No depression/anxiety.  Endocrine: No heat/cold intolerance.  Allergic/Immunologic: No urticaria.  Resp: No cough, SOB.  Musculoskeletal: No joint swelling.    Physical Examination: BP 100/60 mmHg  Pulse 85  Temp(Src) 97.7 F (36.5 C) (Oral)  Resp 18  Ht 5\' 10"  (1.778 m)  Wt 85.276 kg (188 lb)  BMI 26.98 kg/m2  SpO2 96% Gen: NAD, alert and oriented x 4 HEENT: PEERLA, EOMI, Neck: supple, no JVD or thyromegaly Chest: CTA bilaterally, no wheezes, crackles, or other adventitious sounds CV: irreg heart beat, no m/g/c/r Abd: soft, NT, ND, +BS in all four quadrants; no HSM, guarding, ridigity, or rebound tenderness Ext: no edema, well perfused with 2+ pulses, Skin: no rash or lesions noted Lymph: no LAD  Data: Lab Results  Component Value Date   WBC 10.6 01/30/2015   HGB 13.9 01/30/2015   HCT 41.7 01/30/2015   MCV 101.3* 01/30/2015   PLT 170 01/30/2015    Recent Labs Lab 01/27/15 1249 01/28/15 0526 01/30/15 0502  HGB 14.7 13.1 13.9   Lab Results  Component Value Date   NA 136 01/30/2015   K 2.7* 01/30/2015   CL 100* 01/30/2015   CO2 32 01/30/2015   BUN 22* 01/30/2015   CREATININE 1.02 01/30/2015   Lab Results  Component Value Date   ALT 21 01/27/2015   AST 44* 01/27/2015   ALKPHOS 139* 01/27/2015   BILITOT 1.8* 01/27/2015   No results for input(s): APTT, INR, PTT in the last 168 hours. Assessment/Plan: Ronald Good is a 81 y.o. male with new onset dysphagia.    Recommendations: Please correct low K level. Follow  speech therapy's recommendations. No urgent need for EGD at this time. Treat cellulitis 1st. Pt can have f/u in GI after discharge. If dysphagia persists, then will consider EGD with possible dilation OFF predexa x 48 hrs. Will sign off.  Thank you for the consult. Please call with questions or concerns.  Jilleen Essner, Ezzard Standing, MD

## 2015-01-31 LAB — BASIC METABOLIC PANEL
Anion gap: 5 (ref 5–15)
BUN: 22 mg/dL — ABNORMAL HIGH (ref 6–20)
CO2: 35 mmol/L — ABNORMAL HIGH (ref 22–32)
Calcium: 7.6 mg/dL — ABNORMAL LOW (ref 8.9–10.3)
Chloride: 95 mmol/L — ABNORMAL LOW (ref 101–111)
Creatinine, Ser: 0.95 mg/dL (ref 0.61–1.24)
GFR calc non Af Amer: >60 mL/min (ref >60)
Glucose, Bld: 85 mg/dL (ref 65–99)
Potassium: 3.3 mmol/L — ABNORMAL LOW (ref 3.5–5.1)
Sodium: 135 mmol/L (ref 135–145)

## 2015-01-31 MED ORDER — PIPERACILLIN-TAZOBACTAM 3.375 G IVPB
3.3750 g | Freq: Three times a day (TID) | INTRAVENOUS | Status: DC
  Administered 2015-01-31 – 2015-02-03 (×9): 3.375 g via INTRAVENOUS
  Filled 2015-01-31 (×13): qty 50

## 2015-01-31 NOTE — Progress Notes (Signed)
Physical Therapy Treatment Patient Details Name: Ronald Good MRN: [id number] DOB: [birth year] Today's Date: 01/31/2015    History of Present Illness Patient is a 81 y.o. male with hx of cellulitis. Patient was at Dr. Colman Good office for Ronald Good boot change and sent to ER. Patient has PMH of CHF, PVD, AAA, hypercholesterolemia. Has hx of fall with hip fracture and THA.    PT Comments    Patient continues to require extensive assist for all functional mobility at this time (+2).  LEs very painful, discolored; significant ulceration with dry, flaky skin noted. Mod/max assist required for lift off with all transfers, but able to maintain static stance with min/mod assist +2 (decreased assist once upright).  Very minimal/no righting reactions; very high risk for falls. Continue to recommend transition to STR upon discharge.  Follow Up Recommendations  SNF     Equipment Recommendations       Recommendations for Other Services       Precautions / Restrictions Precautions Precautions: Fall Restrictions Weight Bearing Restrictions: No    Mobility  Bed Mobility Overal bed mobility: Needs Assistance Bed Mobility: Supine to Sit     Supine to sit: Mod assist        Transfers Overall transfer level: Needs assistance Equipment used: Rolling walker (2 wheeled) Transfers: Sit to/from Stand Sit to Stand: Mod assist;Max assist;+2 physical assistance         General transfer comment: constant cuing for hand/foot placement (difficulty with adequate foot placement due to limited bilat knee flexion); extensive assist for lift off, mod/max assist +2.  Once upright, able to maintain with min/mod assist +2.  Ambulation/Gait Ambulation/Gait assistance: Min assist;Mod assist;+2 physical assistance Ambulation Distance (Feet): 5 Feet Assistive device: Rolling walker (2 wheeled)       General Gait Details: bed/chair with RW, min/mod assist +2.  Very slow, effortful performance with  absent heel strike/toe off. Heavy WBing bilat UEs.  Limited/no righting reactions with any external perturbation.  Very high fall risk.   Stairs            Wheelchair Mobility    Modified Rankin (Stroke Patients Only)       Balance Overall balance assessment: Needs assistance Sitting-balance support: No upper extremity supported;Feet supported Sitting balance-Leahy Scale: Good     Standing balance support: Bilateral upper extremity supported Standing balance-Leahy Scale: Poor                      Cognition                            Exercises Other Exercises Other Exercises: Rolling bilat for management/hygiene after incontinent bladder episode, sup bilat with heavy use of bedrails.  Dep for clothing/linen change. Other Exercises: Bilat LE seated therex, 1x10: ankle pumps, LAQs, marching; chair press-ups, 1x5.  Able to lift buttocks from chair surfaces, but requires assist to stabilize LEs to prevent sliding forward with exertion.    General Comments General comments (skin integrity, edema, etc.): significant discoloration, ulceration to forefoot and toes; dry, flaky skin throughout LEs (knees distally); widespread rash over abdomen/buttocks/back--RN informed/aware.      Pertinent Vitals/Pain Pain Assessment: Faces Faces Pain Scale: Hurts whole lot Pain Location: Bilat LEs, L > R Pain Intervention(s): Limited activity within patient's tolerance;Monitored during session;Repositioned    Home Living  Prior Function            PT Goals (current goals can now be found in the care plan section) Acute Rehab PT Goals Patient Stated Goal: "I want to stop itching so bad." PT Goal Formulation: With patient Time For Goal Achievement: 02/12/15 Potential to Achieve Goals: Good Progress towards PT goals: Progressing toward goals    Frequency  Min 2X/week    PT Plan Current plan remains appropriate    Co-evaluation              End of Session Equipment Utilized During Treatment: Gait belt Activity Tolerance: Patient tolerated treatment well;Patient limited by pain Patient left: in chair;with call bell/phone within reach;with chair alarm set     Time: 1009-1040 PT Time Calculation (min) (ACUTE ONLY): 31 min  Charges:  $Gait Training: 8-22 mins $Therapeutic Exercise: 8-22 mins                    G Codes:       Ronald Good, PT, DPT, NCS 01/31/2015, 11:38 AM [phone]

## 2015-01-31 NOTE — Clinical Social Work Note (Signed)
Clinical Social Work Assessment  Patient Details  Name: Ronald Good MRN: [id number] Date of Birth: 11-24-1933  Date of referral:  01/31/15               Reason for consult:  Facility Placement                Permission sought to share information with:  Family Supports Permission granted to share information::  Yes, Verbal Permission Granted  Name::        Agency::     Relationship::     Contact Information:     Housing/Transportation Living arrangements for the past 2 months:   (home) Source of Information:  Patient Patient Interpreter Needed:  None Criminal Activity/Legal Involvement Pertinent to Current Situation/Hospitalization:  No - Comment as needed Significant Relationships:  Spouse Lives with:  Spouse Do you feel safe going back to the place where you live?  Yes Need for family participation in patient care:  Yes (Comment)  Care giving concerns:  Patient and wife live together.   Social Worker assessment / plan:  CSW met with patient this morning and he informed me that he was willing to go to short term rehab. He states he lives with his wife. He states he was doing well up until this past week. He prefers [facility] if possible. CSW explained the STR process and need for his insurance to authorize. Patient was falling asleep in his chair by the end of the conversation. Bedsearch to be initiated.  Employment status:  Retired Nurse, adult PT Recommendations:  [place] / Referral to community resources:  [place]  Patient/Family's Response to care:  Patient willing for STR but is not looking forward to it.  Patient/Family's Understanding of and Emotional Response to Diagnosis, Current Treatment, and Prognosis:  Patient rather lethargic during conversation but was appreciative of CSW assistance.  Emotional Assessment Appearance:  Appears stated age Attitude/Demeanor/Rapport:    Affect  (typically observed):    Orientation:  Oriented to Self, Oriented to Place, Oriented to  Time, Oriented to Situation Alcohol / Substance use:  Not Applicable Psych involvement (Current and /or in the community):  No (Comment)  Discharge Needs  Concerns to be addressed:  Basic Needs Readmission within the last 30 days:  No Current discharge risk:  None Barriers to Discharge:  No Barriers Identified   Shela Leff, LCSW 01/31/2015, 10:47 AM

## 2015-01-31 NOTE — Clinical Social Work Note (Signed)
CSW contacted by [facility] which was patient's preference and informed that they will not be able to offer on patient. York Spaniel MSW,LCSWA [phone]

## 2015-01-31 NOTE — Progress Notes (Signed)
SNF and Non-Emergent EMS Transport Benefits:  Number called: [phone] Rep: [place] Reference Number: [id number]  [employer] Medicare [employer] Gold [employer] HMO THN/NTSP plan active as of 08/13/14 with no deductible.  Out of pocket max is $5500, $0 met as of time of call.     In-network SNF: $0 copay for days 1-20, a $160 daily copay for days 21-100.  Once out of pocket is reached, patient covered at 100% for remainder of 100 day benefit period.   Josem Kaufmann is required: [phone].    Non-emergent EMS transport: $300 copay per date of service.  Must be medically necessary.  Josem K[facility]mann is not required for service codes [phone] or [phone].

## 2015-01-31 NOTE — Progress Notes (Signed)
[facility] [place] at [facility]   PATIENT NAME: Ronald Good    MR#:  [id number]  DATE OF BIRTH:  25-Apr-1934  SUBJECTIVE:  CHIEF COMPLAINT:  Infection on legs.  Feels same. No pain. Sitting up in chair.  REVIEW OF SYSTEMS:  CONSTITUTIONAL: No fever, fatigue or weakness. Some weight loss. Feels cold. EYES: No blurred or double vision. Wears glasses. EARS, NOSE, AND THROAT: No tinnitus or ear pain. Decreased hearing. Positive for runny nose. Trouble swallowing last night at dinner. RESPIRATORY: No cough, shortness of breath, wheezing or hemoptysis.  CARDIOVASCULAR: No chest pain or palpitations.  GASTROINTESTINAL: No nausea, vomiting, diarrhea or abdominal pain. Some blood in the bowel movements when wiping only. GENITOURINARY: No dysuria, hematuria.  ENDOCRINE: No polyuria, nocturia,  HEMATOLOGY: No anemia, easy bruising or bleeding SKIN: Itching and scaling on hands and upper extremities. The redness and ulcerations on legs. MUSCULOSKELETAL: Pain in bilateral legs. NEUROLOGIC: No tingling, numbness, weakness.  PSYCHIATRY: No anxiety or depression  ROS  DRUG ALLERGIES:   Allergies  Allergen Reactions  . Oxycodone Other (See Comments)    "makes me loopy"  . Neomycin-Bacitracin Zn-Polymyx Rash    VITALS:  Blood pressure 106/68, pulse 70, temperature 97.6 F (36.4 C), temperature source Oral, resp. rate 16, height  (1.778 m), weight 85.049 kg (187 lb 8 oz), SpO2 99 %.  PHYSICAL EXAMINATION:  GENERAL: 81 y.o.-year-old patient lying in the bed with no acute distress.  EYES: Pupils equal, round, reactive to light and accommodation. No scleral icterus. Extraocular muscles intact.  HEENT: Head atraumatic, normocephalic. Oropharynx and nasopharynx clear.  NECK: Supple, no jugular venous distention. No thyroid enlargement, no tenderness.  LUNGS: Normal breath sounds bilaterally, no wheezing, rales,rhonchi or crepitation. No use of  accessory muscles of respiration.  CARDIOVASCULAR: S1, S2 irregularly irregular, 2/6 systolic murmurs, no rubs, or gallops.  ABDOMEN: Soft, nontender, nondistended. Bowel sounds present. No organomegaly or mass.  EXTREMITIES: 3+ edema. No cyanosis, or clubbing.  NEUROLOGIC: Cranial nerves II through XII are intact. Muscle strength 5/5 in all extremities. Gait not checked.  PSYCHIATRIC: The patient is alert. SKIN: Legs were just wrapped. All of his toes are erythematous and swollen and scabbing. Upper shins have bilateral scabs. Hands have scaling and also erythematous.  Physical Exam LABORATORY PANEL:   CBC  Recent Labs Lab 01/30/15 0502  WBC 10.6  HGB 13.9  HCT 41.7  PLT 170   ------------------------------------------------------------------------------------------------------------------  Chemistries   Recent Labs Lab 01/27/15 1249  01/29/15 1259  01/31/15 0534  NA 134*  < >  --   < > 135  K 3.7  < >  --   < > 3.3*  CL 101  < >  --   < > 95*  CO2 28  < >  --   < > 35*  GLUCOSE 90  < >  --   < > 85  BUN 17  < >  --   < > 22*  CREATININE 0.94  < >  --   < > 0.95  CALCIUM 7.8*  < >  --   < > 7.6*  MG 1.8  < > 1.8  --   --   AST 44*  --   --   --   --   ALT 21  --   --   --   --   ALKPHOS 139*  --   --   --   --   BILITOT 1.8*  --   --   --   --   < > =  values in this interval not displayed. ------------------------------------------------------------------------------------------------------------------  Cardiac Enzymes No results for input(s): TROPONINI in the last 168 hours. ------------------------------------------------------------------------------------------------------------------  RADIOLOGY:  No results found.  ASSESSMENT AND PLAN:   1. Bilateral lower extremity cellulitis and ulcerations- sent in by Dr. Gilda Crease for direct admission.  Wound cx have pseudomonas. Change to Zosyn. May change to PO ciprofloxacin at discharge  2. Chronic systolic  congestive heart failure-    no signs of heart failure at this time continue usual medications.  3. Atrial fibrillation    patient takes Pradaxa for anticoagulation and metoprolol, amiodarone for rate control.  4. Essential hypertension blood pressure stable on usual medications. 5. Gastroesophageal reflux disease without esophagitis continue Protonix. 6. BPH without urinary symptoms continue Flomax. 7. Scaling on hands can give Lotrisone ointment. 8. C/o itching all over the body- benadryl.   Add atarax and hycortison cream today.  9. Generalized weakness  SNF at discharge  All the records are reviewed and case discussed with Care Management/Social Workerr. Management plans discussed with the patient, family and they are in agreement.  CODE STATUS: full  TOTAL TIME TAKING CARE OF THIS PATIENT: 35  minutes.   Milagros Loll R M.D on 01/31/2015   Between 7am to 6pm - Pager - [phone]  After 6pm go to [email or web address] - password [email or web address] [facility]  [place] [facility]  Office  [phone]  CC: Primary care physician; Marisue Ivan, MD

## 2015-01-31 NOTE — Clinical Social Work Note (Signed)
Patient sleeping soundly and CSW contacted patient's wife, this afternoon via phone. CSW extended the bed offers of: [facility], [facility], [facility], and [facility]. CSW made patient's wife aware that [facility] was unable to offer and that a decision would need to be made soon so that the authorization with [facility] can be started. York Spaniel MSW,LCSWA [phone]

## 2015-01-31 NOTE — Consult Note (Signed)
WOC consult requested for bilat legs.  Vascular service is following for assessment and plan of care; refer to progress notes on 6/18. They have ordered Silvadene dressings BID.  Please refer to their team for further assessment and plan of care and re-consult if further assistance is needed.  Thank-you,  Cammie Mcgee MSN, RN, CWOCN, [place], CNS [phone]

## 2015-02-01 LAB — CULTURE, BLOOD (ROUTINE X 2)
CULTURE: NO GROWTH
Culture: NO GROWTH
Special Requests: NORMAL
Special Requests: NORMAL

## 2015-02-01 LAB — BASIC METABOLIC PANEL
ANION GAP: 11 (ref 5–15)
BUN: 22 mg/dL — ABNORMAL HIGH (ref 6–20)
CHLORIDE: 94 mmol/L — AB (ref 101–111)
CO2: 33 mmol/L — ABNORMAL HIGH (ref 22–32)
CREATININE: 0.87 mg/dL (ref 0.61–1.24)
Calcium: 8.3 mg/dL — ABNORMAL LOW (ref 8.9–10.3)
GFR calc Af Amer: >60 mL/min (ref >60)
GFR calc non Af Amer: >60 mL/min (ref >60)
Glucose, Bld: 102 mg/dL — ABNORMAL HIGH (ref 65–99)
POTASSIUM: 3.6 mmol/L (ref 3.5–5.1)
Sodium: 138 mmol/L (ref 135–145)

## 2015-02-01 LAB — MAGNESIUM: Magnesium: 1.6 mg/dL — ABNORMAL LOW (ref 1.7–2.4)

## 2015-02-01 MED ORDER — MAGNESIUM SULFATE 2 GM/50ML IV SOLN
2.0000 g | Freq: Once | INTRAVENOUS | Status: AC
  Administered 2015-02-01: 2 g via INTRAVENOUS
  Filled 2015-02-01: qty 50

## 2015-02-01 MED ORDER — DOCUSATE SODIUM 100 MG PO CAPS
100.0000 mg | ORAL_CAPSULE | Freq: Two times a day (BID) | ORAL | Status: DC
  Administered 2015-02-02 – 2015-02-03 (×2): 100 mg via ORAL
  Filled 2015-02-01 (×3): qty 1

## 2015-02-01 MED ORDER — POLYETHYLENE GLYCOL 3350 17 G PO PACK
17.0000 g | PACK | Freq: Every day | ORAL | Status: DC
  Administered 2015-02-01 – 2015-02-02 (×2): 17 g via ORAL
  Filled 2015-02-01 (×2): qty 1

## 2015-02-01 MED ORDER — MAGNESIUM HYDROXIDE 400 MG/5ML PO SUSP: 30.0000 mL | Freq: Every day | ORAL | Status: DC | PRN

## 2015-02-01 NOTE — Clinical Social Work Note (Signed)
Patient's wife called CSW early this morning to inform that they have chosen [facility]. CSW has contacted Rosey Bath at [facility] and she is beginning [place] with [employer] this morning. York Spaniel MSW,LCSWA [phone]

## 2015-02-01 NOTE — Clinical Social Work Note (Signed)
Authorization has been received by [facility] and patient is approved to go to [facility] when discharged. York Spaniel MSW,LCSWA [phone]

## 2015-02-01 NOTE — Progress Notes (Signed)
[facility] - [place] at [facility]   PATIENT NAME: Ronald Good    MR#:  [id number]  DATE OF BIRTH:  1934-01-04  SUBJECTIVE:  CHIEF COMPLAINT:  Infection on legs.  Feels same. No pain. Sitting up in bed and eating. Dry skin on legs and hands.  REVIEW OF SYSTEMS:  CONSTITUTIONAL: No fever, fatigue or weakness. Some weight loss. Feels cold. EYES: No blurred or double vision. EARS, NOSE, AND THROAT: No tinnitus or ear pain. Decreased hearing. Positive for runny nose. Trouble swallowing last night at dinner. RESPIRATORY: No cough, shortness of breath, wheezing or hemoptysis.  CARDIOVASCULAR: No chest pain or palpitations.  GASTROINTESTINAL: No nausea, vomiting, diarrhea or abdominal pain. Some blood in the bowel movements when wiping only. GENITOURINARY: No dysuria, hematuria.  ENDOCRINE: No polyuria, nocturia,  HEMATOLOGY: No anemia, easy bruising or bleeding SKIN: Itching and scaling on hands and upper extremities. The redness and ulcerations on legs. MUSCULOSKELETAL: Pain in bilateral legs. NEUROLOGIC: No tingling, numbness, weakness.  PSYCHIATRY: No anxiety or depression  ROS  DRUG ALLERGIES:   Allergies  Allergen Reactions  . Oxycodone Other (See Comments)    "makes me loopy"  . Neomycin-Bacitracin Zn-Polymyx Rash    VITALS:  Blood pressure 104/52, pulse 78, temperature 97.8 F (36.6 C), temperature source Oral, resp. rate 21, height  (1.778 m), weight 83.462 kg (184 lb), SpO2 99 %.  PHYSICAL EXAMINATION:  GENERAL: 81 y.o.-year-old patient lying in the bed with no acute distress.  EYES: Pupils equal, round, reactive to light and accommodation. No scleral icterus. Extraocular muscles intact.  HEENT: Head atraumatic, normocephalic. Oropharynx and nasopharynx clear.  NECK: Supple, no jugular venous distention. No thyroid enlargement, no tenderness.  LUNGS: Normal breath sounds bilaterally, no wheezing, rales,rhonchi or  crepitation. No use of accessory muscles of respiration.  CARDIOVASCULAR: S1, S2 irregularly irregular, 2/6 systolic murmurs, no rubs, or gallops.  ABDOMEN: Soft, nontender, nondistended. Bowel sounds present. No organomegaly or mass.  EXTREMITIES: 3+ edema. No cyanosis, or clubbing.  NEUROLOGIC: Cranial nerves II through XII are intact. Muscle strength 5/5 in all extremities. Gait not checked.  PSYCHIATRIC: The patient is alert. SKIN: Legs were just wrapped. All of his toes are erythematous and swollen and scabbing. Upper shins have bilateral scabs. Hands have scaling and also erythematous.  Physical Exam LABORATORY PANEL:   CBC  Recent Labs Lab 01/30/15 0502  WBC 10.6  HGB 13.9  HCT 41.7  PLT 170   ------------------------------------------------------------------------------------------------------------------  Chemistries   Recent Labs Lab 01/27/15 1249  02/01/15 0450  NA 134*  < > 138  K 3.7  < > 3.6  CL 101  < > 94*  CO2 28  < > 33*  GLUCOSE 90  < > 102*  BUN 17  < > 22*  CREATININE 0.94  < > 0.87  CALCIUM 7.8*  < > 8.3*  MG 1.8  < > 1.6*  AST 44*  --   --   ALT 21  --   --   ALKPHOS 139*  --   --   BILITOT 1.8*  --   --   < > = values in this interval not displayed. ------------------------------------------------------------------------------------------------------------------  Cardiac Enzymes No results for input(s): TROPONINI in the last 168 hours. ------------------------------------------------------------------------------------------------------------------  RADIOLOGY:  No results found.  ASSESSMENT AND PLAN:   1. Bilateral lower extremity cellulitis and ulcerations Wound cx have pseudomonas. Change to Zosyn. May change to PO ciprofloxacin at discharge  2. Chronic systolic congestive heart failure-  no signs of heart failure at this time continue usual medications.  3. Atrial fibrillation    patient takes Pradaxa for anticoagulation  and metoprolol, amiodarone for rate control.  4. Essential hypertension blood pressure stable on usual medications. 5. Gastroesophageal reflux disease without esophagitis continue Protonix. 6. BPH without urinary symptoms continue Flomax. 7. Scaling on hands can give Lotrisone ointment. 8. C/o itching all over the body- benadryl PRN   atarax and hycortison cream.  9. Generalized weakness  SNF at discharge  Discharge per Dr. Gilda Crease.  All the records are reviewed and case discussed with Care Management/Social Workerr. Management plans discussed with the patient, family and they are in agreement.  CODE STATUS:  FULL CODE  TOTAL TIME TAKING CARE OF THIS PATIENT: 35  minutes.   Milagros Loll R M.D on 02/01/2015   Between 7am to 6pm - Pager - [phone]  After 6pm go to [email or web address] - password [email or web address] [facility]  [place] [facility]  Office  [phone]  CC: Primary care physician; Marisue Ivan, MD

## 2015-02-01 NOTE — Progress Notes (Signed)
Physical Therapy Treatment Patient Details Name: Ronald Good MRN: [id number] DOB: Jul 31, 1934 Today's Date: 02/01/2015    History of Present Illness Patient is a 81 y.o. male with hx of cellulitis. Patient was at Dr. Colman Cater office for [name] boot change and sent to ER. Patient has PMH of CHF, PVD, AAA, hypercholesterolemia. Has hx of fall with hip fracture and THA.    PT Comments    Pt complains 2x during session of having a bowel movement; nursing in to check and pt did not have bowel movement. Pt continues to complain of a sore bottom. Educated pt on changing position in bed to relieve pressure on bottom by rolling side to side, lying further back flat in bed and performing bridges. Pt educated to change position every 1.5 to 2 hours. Pt performed all exercises well/actively. Assisted in bed mobility for brief change and personal hygiene with education on performance using rails.   Follow Up Recommendations  SNF     Equipment Recommendations       Recommendations for Other Services       Precautions / Restrictions Precautions Precautions: Fall Restrictions Weight Bearing Restrictions: No    Mobility  Bed Mobility Overal bed mobility: Needs Assistance Bed Mobility: Rolling Rolling: Min assist (during brief change with nursing)         General bed mobility comments: Educated on using rails to change position side to side   Transfers                 General transfer comment: Pt does not wish in chair due to sore bottom  Ambulation/Gait                 Stairs            Wheelchair Mobility    Modified Rankin (Stroke Patients Only)       Balance                                    Cognition Arousal/Alertness: Awake/alert Behavior During Therapy: WFL for tasks assessed/performed Overall Cognitive Status: Within Functional Limits for tasks assessed                      Exercises General Exercises - Lower  Extremity Ankle Circles/Pumps: AROM;Both;20 reps;Supine Quad Sets:  (attempted; pt unable to comprehend exercise) Short Arc Quad: AROM;Both;20 reps;Supine Heel Slides: AROM;Both;20 reps;Supine Hip ABduction/ADduction: AROM;Both;20 reps;Supine Straight Leg Raises: AROM;Both;20 reps;Supine Other Exercises Other Exercises:  (Bridging, 20x)    General Comments        Pertinent Vitals/Pain Pain Assessment: 0-10 Pain Score: 9  Pain Location: BLE (Also notes sore bottom) Pain Intervention(s): Limited activity within patient's tolerance;Premedicated before session    Home Living                      Prior Function            PT Goals (current goals can now be found in the care plan section) Progress towards PT goals: Progressing toward goals (slowly)    Frequency  Min 2X/week    PT Plan Current plan remains appropriate    Co-evaluation             End of Session   Activity Tolerance: Patient limited by fatigue;Patient limited by pain Patient left: in bed;with call bell/phone within reach;with chair alarm set  Time: 1425-     Charges:  $Therapeutic Exercise: 8-22 mins $Therapeutic Activity: 8-22 mins                    G Codes:      Kristeen Miss 02/01/2015, 2:52 PM

## 2015-02-02 ENCOUNTER — Inpatient Hospital Stay: Payer: Commercial Managed Care - HMO

## 2015-02-02 LAB — WOUND CULTURE
Gram Stain: NONE SEEN
SPECIAL REQUESTS: NORMAL

## 2015-02-02 MED ORDER — TRAMADOL HCL 50 MG PO TABS: 50.0000 mg | ORAL_TABLET | Freq: Four times a day (QID) | ORAL | Status: DC | PRN

## 2015-02-02 MED ORDER — CALAMINE EX LOTN
TOPICAL_LOTION | Freq: Two times a day (BID) | CUTANEOUS | Status: DC
  Administered 2015-02-02 – 2015-02-03 (×3): via TOPICAL
  Filled 2015-02-02: qty 118

## 2015-02-02 NOTE — Progress Notes (Signed)
Initial Nutrition Assessment  INTERVENTION: Meals and Snacks: Cater to patient preferences, will send afternoon snack of canned fruit Medical Food Supplement Therapy:  Agree with Ensure TID as ordered; will also recommend Magic Cup BID as pt reports liking ice cream/orange sherbet.  Ensure Enlive (each supplement provides 350kcal and 20 grams of protein), Magic cup  NUTRITION DIAGNOSIS:  Inadequate oral intake related to acute illness as evidenced by meal completion < 50%.  GOAL:  Patient will meet greater than or equal to 90% of their needs  MONITOR:   (Energy Intake, Electrolyte and renal Profile, Digestive system, Anthropometrics)  REASON FOR ASSESSMENT:   (RD screen, Length of Stay)    ASSESSMENT:  Pt admitted with weeping bilateral extremity cellulitis; vascular following PMHx:  Past Medical History  Diagnosis Date  . Chronic systolic congestive heart failure   . BPH (benign prostatic hyperplasia)   . Atrial fibrillation   . Essential hypertension   . Gastroesophageal reflux disease    Diet Order: Dysphagia II, thin liquids, SLP following. Pt reports to RD tolerating Dysphagia II well currently.  Current Nutrition: Pt reports eating bites this am of breakfast but reports drinking 100% of Ensure. Pt reports decreased appetite since admission. Pt wife reports pt on heart healthy diet order. Recorded po intake <50% of recorded meals in I/O chart, pt also keeping trays at times. But pt reports drinking Ensures.  Food/Nutrition-Related History: Pt reports healthy appetite PTA, eating 3 meals per day.   Medications: Colace, Lasix, Protonix, KCl  Electrolyte/Renal Profile and Glucose Profile:   Recent Labs Lab 01/27/15 1249 01/28/15 0526 01/29/15 1259 01/30/15 0502 01/31/15 0534 02/01/15 0450  NA 134* 136  --  136 135 138  K 3.7 3.9  --  2.7* 3.3* 3.6  CL 101 105  --  100* 95* 94*  CO2 28 26  --  32 35* 33*  BUN 17 18  --  22* 22* 22*  CREATININE 0.94 0.90  --   1.02 0.95 0.87  CALCIUM 7.8* 7.6*  --  7.5* 7.6* 8.3*  MG 1.8 1.9 1.8  --   --  1.6*  PHOS 3.6  --   --   --   --   --   GLUCOSE 90 91  --  90 85 102*   Protein Profile:  Recent Labs Lab 01/27/15 1249  ALBUMIN 2.6*   Nutritional Anemia Profile:  CBC Latest Ref Rng 01/30/2015 01/28/2015 01/27/2015  WBC 3.8 - 10.6 K/uL 10.6 10.2 10.3  Hemoglobin 13.0 - 18.0 g/dL 69.6 29.5 28.4  Hematocrit 40.0 - 52.0 % 41.7 39.6(L) 45.4  Platelets 150 - 440 K/uL 170 162 176    Gastrointestinal Profile: Last BM 6/22  Nutrition-Focused Physical Exam Findings: Nutrition-Focused physical exam completed. Findings are WDL for fat depletion, muscle depletion, and edema, however RD unable to assess thigh or calf regions on visit.  Anthropometrics: Weight Change: Pt wife reports thinking pt weight of 222lbs outpatient MD office several months ago.  Weights this admission: Filed Weights   01/30/15 0441 01/31/15 0422 02/01/15 0455  Weight: 188 lb (85.276 kg) 187 lb 8 oz (85.049 kg) 184 lb (83.462 kg)   Height:  Ht Readings from Last 1 Encounters:  01/27/15  (1.778 m)   Weight:  Wt Readings from Last 1 Encounters:  02/01/15 184 lb (83.462 kg)    Wt Readings from Last 10 Encounters:  02/01/15 184 lb (83.462 kg)    BMI:  Body mass index is 26.4 kg/(m^2).  Estimated Nutritional Needs:  Kcal:  2055-2429kcals, BEE: 1557kcals, TEE: (IF 1.1-1.3)(AF 1.2)  Protein:  84-100g protein (1.0-1.2g/kg)  Fluid:  2090-2588mL of fluid (25-51mL/kg)  Skin:   reviewed  EDUCATION NEEDS:  No education needs identified at this time   Intake/Output Summary (Last 24 hours) at 02/02/15 1258 Last data filed at 02/02/15 1100  Gross per 24 hour  Intake    527 ml  Output    250 ml  Net    277 ml    HIGH Care Level  Leda Quail, RD, LDN Pager [phone]

## 2015-02-02 NOTE — Progress Notes (Signed)
[facility] - [place] at [employer]   PATIENT NAME: Ronald Good    MR#:  [id number]  DATE OF BIRTH:  07/03/1934  SUBJECTIVE:  CHIEF COMPLAINT:  Infection on legs.  Feels same. No pain. Sitting up in bed. Dry skin on legs and hands. Family at bedside. Feels weak.  REVIEW OF SYSTEMS:  CONSTITUTIONAL: No fever, fatigue or weakness. Some weight loss. Feels cold. EYES: No blurred or double vision. EARS, NOSE, AND THROAT: No tinnitus or ear pain. Decreased hearing.  RESPIRATORY: No cough, shortness of breath, wheezing or hemoptysis.  CARDIOVASCULAR: No chest pain or palpitations.  GASTROINTESTINAL: No nausea, vomiting, diarrhea or abdominal pain. GENITOURINARY: No dysuria, hematuria.  ENDOCRINE: No polyuria, nocturia,  HEMATOLOGY: No anemia, easy bruising or bleeding SKIN: Itching and scaling on hands and upper extremities. The redness and ulcerations on legs. MUSCULOSKELETAL: Pain in bilateral legs. NEUROLOGIC: No tingling, numbness, weakness.  PSYCHIATRY: No anxiety or depression  ROS  DRUG ALLERGIES:   Allergies  Allergen Reactions  . Oxycodone Other (See Comments)    "makes me loopy"  . Neomycin-Bacitracin Zn-Polymyx Rash    VITALS:  Blood pressure 96/61, pulse 80, temperature 97.6 F (36.4 C), temperature source Oral, resp. rate 20, height  (1.778 m), weight 83.462 kg (184 lb), SpO2 92 %.  PHYSICAL EXAMINATION:  GENERAL: 81 y.o.-year-old patient lying in the bed with no acute distress.  EYES: Pupils equal, round, reactive to light and accommodation. No scleral icterus. Extraocular muscles intact.  HEENT: Head atraumatic, normocephalic. Oropharynx and nasopharynx clear.  NECK: Supple, no jugular venous distention. No thyroid enlargement, no tenderness.  LUNGS: Normal breath sounds bilaterally, no wheezing, rales,rhonchi or crepitation. No use of accessory muscles of respiration.  CARDIOVASCULAR: S1, S2 irregularly irregular,  2/6 systolic murmurs, no rubs, or gallops.  ABDOMEN: Soft, nontender, nondistended. Bowel sounds present. No organomegaly or mass.  EXTREMITIES: 3+ edema. No cyanosis, or clubbing.  NEUROLOGIC: Cranial nerves II through XII are intact. Muscle strength 5/5 in all extremities. Gait not checked.  PSYCHIATRIC: The patient is alert. SKIN: LE bilateral dressing. Hands dry peeling skin  Physical Exam LABORATORY PANEL:   CBC  Recent Labs Lab 01/30/15 0502  WBC 10.6  HGB 13.9  HCT 41.7  PLT 170   ------------------------------------------------------------------------------------------------------------------  Chemistries   Recent Labs Lab 01/27/15 1249  02/01/15 0450  NA 134*  < > 138  K 3.7  < > 3.6  CL 101  < > 94*  CO2 28  < > 33*  GLUCOSE 90  < > 102*  BUN 17  < > 22*  CREATININE 0.94  < > 0.87  CALCIUM 7.8*  < > 8.3*  MG 1.8  < > 1.6*  AST 44*  --   --   ALT 21  --   --   ALKPHOS 139*  --   --   BILITOT 1.8*  --   --   < > = values in this interval not displayed. ------------------------------------------------------------------------------------------------------------------  Cardiac Enzymes No results for input(s): TROPONINI in the last 168 hours. ------------------------------------------------------------------------------------------------------------------  RADIOLOGY:  No results found.  ASSESSMENT AND PLAN:   1. Bilateral lower extremity cellulitis and ulcerations Wound cx have pseudomonas. Changed to Zosyn. May change to PO ciprofloxacin at discharge  2. Chronic systolic congestive heart failure-    no signs of heart failure at this time continue usual medications.  3. Atrial fibrillation    patient takes Pradaxa for anticoagulation and metoprolol, amiodarone for rate control.  4. Essential hypertension blood pressure stable on usual medications. 5. Gastroesophageal reflux disease without esophagitis continue Protonix. 6. BPH without urinary  symptoms continue Flomax.  8. C/o itching all over the body- benadryl PRN   atarax   9. Generalized weakness  SNF at discharge  10. Dysphagia Ordered swallow eval. Also needed GI f/u after discharge 1-2 weeks.  Discharge per Dr. Gilda Crease.  All the records are reviewed and case discussed with Care Management/Social Workerr. Management plans discussed with the patient, family and they are in agreement.  CODE STATUS:  FULL CODE  TOTAL TIME TAKING CARE OF THIS PATIENT: 35  minutes.   Milagros Loll R M.D on 02/02/2015   Between 7am to 6pm - Pager - [phone]  After 6pm go to [email or web address] - password [email or web address] [facility]  [place]  Office  [phone]  CC: Primary care physician; Marisue Ivan, MD

## 2015-02-02 NOTE — Progress Notes (Signed)
Hold Lasix dose per Dr. Allena Katz

## 2015-02-02 NOTE — Evaluation (Signed)
Objective Swallowing Evaluation: Other (Comment) (MBSS)  Patient Details  Name: Ronald Good MRN: [id number] Date of Birth: 1933-12-14  Today's Date: 02/02/2015 Time: SLP Start Time (ACUTE ONLY): 1515-SLP Stop Time (ACUTE ONLY): 1615 SLP Time Calculation (min) (ACUTE ONLY): 60 min  Past Medical History:  Past Medical History  Diagnosis Date  . Chronic systolic congestive heart failure   . BPH (benign prostatic hyperplasia)   . Atrial fibrillation   . Essential hypertension   . Gastroesophageal reflux disease    Past Surgical History:  Past Surgical History  Procedure Laterality Date  . Replacement total knee bilateral Bilateral    HPI:  Other Pertinent Information: Pt c/o "trouble swallowing" at his meals; noted per MD notes as well. Pt explained that he feels food "stops here" pointing to his lower throat area; he acknoledged "it trys to come back up". Pt has c/o phlegm. Pt stated he eats a "regular diet at home". He denied any s/s of aspiration such as coughing/choking. He denied having seen a GI MD for any Esophageal motility issues and stated he does not take any medication for his stomach(pt does have Pantoprazole prescribed per chart note). Noted wounds on his LEs. Pt is currently on a Dys. 2 w/ thin liquids post BSE.   No Data Recorded  Assessment / Plan / Recommendation  Subjective: Patient behavior: (alertness, ability to follow instructions, etc.): pt appeared fatigued; required verbal cues and time to follow through w/ instructions. Chief complaint: difficulty swallowing at meals. It appeared related to GI dysmotility initially.    Objective:  Radiological Procedure: A videoflouroscopic evaluation of oral-preparatory, reflex initiation, and pharyngeal phases of the swallow was performed; as well as a screening of the upper esophageal phase.  I. POSTURE: upright II. VIEW: lateral III. COMPENSATORY STRATEGIES: multiple swallows; alternating food w/ liquids.   IV. BOLUSES ADMINISTERED:  Thin Liquid: 5 trials approx.   Nectar-thick Liquid: 3 trials approx.  Honey-thick Liquid: NT  Puree: 3 trials  Mechanical Soft: NT V. RESULTS OF EVALUATION: A. ORAL PREPARATORY PHASE: (The lips, tongue, and velum are observed for strength and coordination)       **Overall Severity Rating: MODERATE.  Pt exhibited decreased bolus control and coordination w/ premature spillage of liquids into the pharynx. Pt exhibited decreased lingual strength for sweeping and oral clearing b/t boluses. Noted bolus residue base of tongue.   B. SWALLOW INITIATION/REFLEX: (The reflex is normal if "triggered" by the time the bolus reached the base of the tongue)  **Overall Severity Rating: MODERATE.  Pt exhibited a delayed pharyngeal swallow initiation w/ trials of thin liquids spilling into, w/ a moderate amount noted, the pyriform sinuses b/f the swallow occurred. Noted laryngeal penetration(deep x1) before, and during, the swallow. This laryngeal penetration was SILENT. There a buildup of the penetrated residue as the exam continued. Trials of Nectar liquids and Puree appeared to trigger a more timely pharyngeal swallow at/just below the level of the valleculae.   C. PHARYNGEAL PHASE: (Pharyngeal function is normal if the bolus shows rapid, smooth, and continuous transit through the pharynx and there is no pharyngeal residue after the swallow)  **Overall Severity Rating: MODERATE.  Pt exhibited bolus residue throughout the pharyn in both the valleculae and pyriform sinuses indicating decreased laryngeal excursion and pharyngeal pressure during the swallowing. Noted decreased anterior movement during the swallowing; second swallow could be less complete. Pt utilized a f/u, multiple swallow w/ verbal cues from the SLP which appeared to decreased this pharyngeal residue but not  clear it. Due to this pharyngeal residue, pt is at increased risk for aspiration of such.   D. LARYNGEAL  PENETRATION: (Material entering into the laryngeal inlet/vestibule but not aspirated): trials of thin liquids x4 (SILENT) E. ASPIRATION: suspected penetrated residue was aspirated. F. ESOPHAGEAL PHASE: (Screening of the upper esophagus): MODERATE bolus dysmotility in the lower cervical Esophagus-mid Esophagus w/ trials of puree.   ASSESSMENT: Pt appears to present w/ moderate oropharyngeal phase dysphagia and Esophageal dysmotility. Pt exhibited SILENT laryngeal penetration of thin liquids during this exam. He demo. Decreased ability to protect his airway. Pt is at increased risk for aspiration of thin liquids sec. to delayed pharyngeal swallow initiation but also to the pharyngeal residue remaining b/t boluses. Pt is able to utilize aspiration precautions and strategies w/ verbal cues. Pt would benefit from a modified dysphagia diet w/ Nectar liquids; monitoring at meals for aspiration precautions/strategies; Meds in puree - crushed.  NSG updated on pt's MBSS and recs. Will f/u w/ MD.   PLAN/RECOMMENDATIONS:  A. Diet: Dys. II w/ Nectar liquids  B. Swallowing Precautions: multiple swallows; hard cough post meals/po's.   C. Recommended consultation to GI for further assessment/tx of Esophageal dysmotility.  D. Therapy recommendations: dysphagia tx; oropharyngeal exs. Reassessment(objective) of swallow function when appropriate.   E. Results and recommendations were w/ NSG; pt.    CHL IP TREATMENT RECOMMENDATION 02/02/2015  Treatment Recommendations Therapy as outlined in treatment plan below     CHL IP DIET RECOMMENDATION 02/02/2015  SLP Diet Recommendations Dysphagia 2 (Fine chop);Nectar  Liquid Administration via (None)  Medication Administration Crushed with puree  Compensations Small sips/bites;Check for pocketing;Multiple dry swallows after each bite/sip;Follow solids with liquid  Postural Changes and/or Swallow Maneuvers (None)     CHL IP OTHER RECOMMENDATIONS 02/02/2015  Recommended  Consults Consider GI evaluation  Oral Care Recommendations Oral care BID;Oral care before and after PO;Staff/trained caregiver to provide oral care  Other Recommendations Order thickener from pharmacy;Remove water pitcher;Prohibited food (jello, ice cream, thin soups)     No flowsheet data found.   CHL IP FREQUENCY AND DURATION 02/02/2015  Speech Therapy Frequency (ACUTE ONLY) min 3x week  Treatment Duration 1 week     Pertinent Vitals/Pain denied    SLP Swallow Goals See care plan      CHL IP REASON FOR REFERRAL 02/02/2015  Reason for Referral Objectively evaluate swallowing function     No flowsheet data found.    No flowsheet data found.    No flowsheet data found.  No flowsheet data found.         Brandley Aldrete 02/02/2015, 4:03 PM

## 2015-02-02 NOTE — Progress Notes (Signed)
S:  States pain is mostly gone from legs       Had Barium swallow today moderate risk for aspiration  O: He remains afebrile vital signs are stable      Both lower extremities are clean ulcers appear to be healing dressings are intact there is minimal edema.  A: Cellulitis bilateral lower extremities with multiple ulcerations      Difficulty with swallowing  P: Patient has completed his 7 days of IV antibiotics as of tomorrow his legs are tremendously improved compared to what they were a week ago in the office. He has failed his physical therapy evaluation and therefore arrangements are being made for him to go to a skilled nursing facility and I will plan for his discharge tomorrow. With respect to his difficulty with swallowing it is recommended that he have thickened liquids. This will be continued as well after his discharge. I have spoken with his wife by phone this evening to discuss our plans and she voices understanding.

## 2015-02-02 NOTE — Clinical Social Work Note (Signed)
Vascular has not documented a note for yesterday. Patient's nurse contacted vascular and he intends to round at some point today. Nursing relayed that patient is ready for discharge from CSW standpoint. CSW spoke with the hospitalist rounding on patient and he stated vascular would have to clear patient. York Spaniel MSW,LCSWA [phone]

## 2015-02-02 NOTE — Progress Notes (Signed)
Physical Therapy Treatment Patient Details Name: Ronald Good MRN: [id number] DOB: 12/03/1933 Today's Date: 02/02/2015    History of Present Illness Patient is a 81 y.o. male with hx of cellulitis. Patient was at Dr. Colman Good office for Ronald Good boot change and sent to ER. Patient has PMH of CHF, PVD, AAA, hypercholesterolemia. Has hx of fall with hip fracture and THA.    PT Comments    Pt demonstrates very poor strength with transfers and ambulation. He is very unsafe with poor awareness. Pt is unable to make it all the way to the bathroom before his knees start to buckle and BSC must be brought underneath him. Pt also with relatively poor command follow. Pt will benefit from skilled PT services to address deficits in strength, balance, and mobility in order to return to full function at home.   Follow Up Recommendations  SNF     Equipment Recommendations       Recommendations for Other Services       Precautions / Restrictions Precautions Precautions: Fall Restrictions Weight Bearing Restrictions: No    Mobility  Bed Mobility               General bed mobility comments: Received upright in recliner  Transfers Overall transfer level: Needs assistance Equipment used: Rolling walker (2 wheeled)   Sit to Stand: +2 physical assistance;Min assist;Mod assist (Mod+2 for second transfer)         General transfer comment: Pt demonstrate poor LE strength, poor anterior weight shift, and poor safety awareness. Cues for positioning and hand placement during transfers. Pt requires verbal and tactile cues for walker placement and to keep walker close to body with turns and stand to sit  Ambulation/Gait Ambulation/Gait assistance: Mod assist;+2 physical assistance Ambulation Distance (Feet): 30 Feet (20+10) Assistive device: Rolling walker (2 wheeled) Gait Pattern/deviations: Shuffle;Decreased step length - right;Decreased step length - left (partial step-through)    Gait velocity interpretation: <1.8 ft/sec, indicative of risk for recurrent falls General Gait Details: Pt ambulated from recliner to bathroom. He requires cues for proper use of walker including keeping walker close to body. Pt takes very small steps with crouched posture requiring cues to remain upright. Step length is very short and overall speed extremely slow. Pt requires cues for step length and turns. Toward the end of ambulation pt starts to buckle in knees and is unable to make it fully to commode so BSC must be brought behind him.   Stairs            Wheelchair Mobility    Modified Rankin (Stroke Patients Only)       Balance Overall balance assessment: Needs assistance Sitting-balance support: No upper extremity supported;Feet supported Sitting balance-Leahy Scale: Good     Standing balance support: Bilateral upper extremity supported Standing balance-Leahy Scale: Poor                      Cognition Arousal/Alertness: Awake/alert Behavior During Therapy: WFL for tasks assessed/performed Overall Cognitive Status: Within Functional Limits for tasks assessed (Pt does not follow commands well)                      Exercises General Exercises - Lower Extremity Heel Slides: Both;20 reps;Strengthening;Seated Hip ABduction/ADduction: AROM;Both;10 reps;Seated Hip Flexion/Marching: Strengthening;Both;10 reps;Seated Heel Raises: Strengthening;Both;10 reps;Seated Other Exercises Other Exercises:  (Bridging, 20x)    General Comments        Pertinent Vitals/Pain Pain Assessment: 0-10 Faces Pain Scale:  Hurts whole lot Pain Location: "Bottom" Pain Intervention(s): Monitored during session    Home Living                      Prior Function            PT Goals (current goals can now be found in the care plan section) Acute Rehab PT Goals Patient Stated Goal: "I want to stop itching so bad." PT Goal Formulation: With patient Time For  Goal Achievement: 02/12/15 Potential to Achieve Goals: Good Progress towards PT goals: Progressing toward goals    Frequency  Min 2X/week    PT Plan Current plan remains appropriate    Co-evaluation             End of Session Equipment Utilized During Treatment: Gait belt Activity Tolerance: Patient limited by fatigue;Patient limited by pain Patient left: in bed;with call bell/phone within reach;with nursing/sitter in room (Transport taking for sallow study)     Time: 8338-2505 PT Time Calculation (min) (ACUTE ONLY): 35 min  Charges:  $Gait Training: 8-22 mins $Therapeutic Exercise: 8-22 mins                    G Codes:      Ronald Good PT, DPT   Ronald Good 02/02/2015, 3:21 PM

## 2015-02-03 MED ORDER — POLYETHYLENE GLYCOL 3350 17 G PO PACK: 17.0000 g | PACK | Freq: Every day | ORAL | Status: DC

## 2015-02-03 MED ORDER — SILVER SULFADIAZINE 1 % EX CREA: TOPICAL_CREAM | Freq: Two times a day (BID) | CUTANEOUS | Status: DC

## 2015-02-03 MED ORDER — HYDROXYZINE HCL 10 MG PO TABS: 10.0000 mg | ORAL_TABLET | Freq: Three times a day (TID) | ORAL | Status: DC

## 2015-02-03 MED ORDER — TRAMADOL HCL 50 MG PO TABS: 100.0000 mg | ORAL_TABLET | Freq: Four times a day (QID) | ORAL | Status: DC | PRN

## 2015-02-03 NOTE — Progress Notes (Signed)
Report called to Morrie Sheldon, RN, at [facility].  Patient alert with intermittent confusion; no complaints of pain.  Unna boots placed per MD order.  Tolerating diet well.  Intermittently incontinent.  Patient will be transported via EMS.

## 2015-02-03 NOTE — Progress Notes (Signed)
Spoke with Dr. Gilda Crease regarding order to place lamb's wool between the toes - the hospital does not have lamb's wool.  He advised facility can order and place it; to disregard this portion of the wound care order.

## 2015-02-03 NOTE — Progress Notes (Signed)
[facility] - [place] at [facility]   PATIENT NAME: Ronald Good    MR#:  [id number]  DATE OF BIRTH:  1933/11/10  SUBJECTIVE:  CHIEF COMPLAINT:  Infection on legs.  Dry scaling skin palms. Happens every year for few weeks per patient.  REVIEW OF SYSTEMS:  CONSTITUTIONAL: No fever, fatigue or weakness.  EYES: No blurred or double vision. EARS, NOSE, AND THROAT: No tinnitus or ear pain. Decreased hearing.  RESPIRATORY: No cough, shortness of breath, wheezing or hemoptysis.  CARDIOVASCULAR: No chest pain or palpitations.  GASTROINTESTINAL: No nausea, vomiting, diarrhea or abdominal pain. GENITOURINARY: No dysuria, hematuria.  ENDOCRINE: No polyuria, nocturia,  HEMATOLOGY: No anemia, easy bruising or bleeding SKIN: Itching and scaling on hands and upper extremities. The redness and ulcerations on legs. MUSCULOSKELETAL: Pain in bilateral legs. NEUROLOGIC: No tingling, numbness, weakness.  PSYCHIATRY: No anxiety or depression  ROS  DRUG ALLERGIES:   Allergies  Allergen Reactions  . Oxycodone Other (See Comments)    "makes me loopy"  . Neomycin-Bacitracin Zn-Polymyx Rash    VITALS:  Blood pressure 95/53, pulse 64, temperature 97.7 F (36.5 C), temperature source Oral, resp. rate 16, height 5\' 10"  (1.778 m), weight 80.332 kg (177 lb 1.6 oz), SpO2 100 %.  PHYSICAL EXAMINATION:  GENERAL: 81 y.o.-year-old patient lying in the bed with no acute distress.  EYES: Pupils equal, round, reactive to light and accommodation. No scleral icterus. Extraocular muscles intact.  HEENT: Head atraumatic, normocephalic. Oropharynx and nasopharynx clear.  NECK: Supple, no jugular venous distention. No thyroid enlargement, no tenderness.  LUNGS: Normal breath sounds bilaterally, no wheezing, rales,rhonchi or crepitation. No use of accessory muscles of respiration.  CARDIOVASCULAR: S1, S2 irregularly irregular, 2/6 systolic murmurs, no rubs, or gallops.   ABDOMEN: Soft, nontender, nondistended. Bowel sounds present. No organomegaly or mass.  EXTREMITIES: 3+ edema. No cyanosis, or clubbing.  NEUROLOGIC: Cranial nerves II through XII are intact. Muscle strength 5/5 in all extremities. Gait not checked.  PSYCHIATRIC: The patient is alert. SKIN: LE bilateral dressing. Hands dry peeling skin  Physical Exam LABORATORY PANEL:   CBC  Recent Labs Lab 01/30/15 0502  WBC 10.6  HGB 13.9  HCT 41.7  PLT 170   ------------------------------------------------------------------------------------------------------------------  Chemistries   Recent Labs Lab 01/27/15 1249  02/01/15 0450  NA 134*  < > 138  K 3.7  < > 3.6  CL 101  < > 94*  CO2 28  < > 33*  GLUCOSE 90  < > 102*  BUN 17  < > 22*  CREATININE 0.94  < > 0.87  CALCIUM 7.8*  < > 8.3*  MG 1.8  < > 1.6*  AST 44*  --   --   ALT 21  --   --   ALKPHOS 139*  --   --   BILITOT 1.8*  --   --   < > = values in this interval not displayed. ------------------------------------------------------------------------------------------------------------------  Cardiac Enzymes No results for input(s): TROPONINI in the last 168 hours. ------------------------------------------------------------------------------------------------------------------  RADIOLOGY:  No results found.  ASSESSMENT AND PLAN:   1. Bilateral lower extremity cellulitis and ulcerations Wound cx have pseudomonas. Changed to Zosyn. May change to PO ciprofloxacin at discharge  2. Chronic systolic congestive heart failure-    no signs of heart failure at this time continue usual medications.  3. Atrial fibrillation    patient takes Pradaxa for anticoagulation and metoprolol, amiodarone for rate control.  4. Essential hypertension blood pressure stable on usual medications.  5. Gastroesophageal reflux disease without esophagitis continue Protonix. 6. BPH without urinary symptoms continue Flomax.  8. C/o itching  all over the body- benadryl PRN   atarax  Added calamine lotion  9. Generalized weakness  SNF at discharge  10. Dysphagia Modified diet. GI f/u as OP  Dermatology f/u for his chronic skin condition  All the records are reviewed and case discussed with Care Management/Social Workerr. Management plans discussed with the patient, family and they are in agreement.  CODE STATUS:  FULL CODE  TOTAL TIME TAKING CARE OF THIS PATIENT: 25  minutes.   Milagros Loll R M.D on 02/03/2015   Between 7am to 6pm - Pager - [phone]  After 6pm go to [email or web address] - password [email or web address] [facility]  [facility] [place] [facility]  Office  [phone]  CC: Primary care physician; Marisue Ivan, MD

## 2015-02-03 NOTE — Discharge Summary (Signed)
[facility]    Discharge Summary    Patient ID:  Ronald Good MRN: [id number] DOB/AGE: 1934/07/17 81 y.o.  Admit date: 01/27/2015 Discharge date: 02/03/2015 Date of Surgery: * No surgery found *no surgeries were performed during this admission Surgeon:   Admission Diagnosis: Bilateral lower Extremity Cellulitis   Discharge Diagnoses:  Bilateral lower Extremity Cellulitis   Secondary Diagnoses: Past Medical History  Diagnosis Date  . Chronic systolic congestive heart failure   . BPH (benign prostatic hyperplasia)   . Atrial fibrillation   . Essential hypertension   . Gastroesophageal reflux disease       Discharged Condition: good  HPI:  The patient was admitted 7 days ago secondary to cellulitis with out-of-control lymphedema multiple ulcerations bilaterally and increasing pain in his feet. He was admitted directly from the office. He had failed Unna therapy  Hospital Course:  Ronald Good is a 81 y.o. male is S/P Bilateral cellulitis of the lower extremities he did not undergo any surgeries during this admission  Physical exam: The patient is in bed supine he appears comfortable. His speech is fluent but his articulation is somewhat choppy. Heart is regular rate and rhythm. Lungs coarse lung sounds bilaterally but equal breath sounds no use of accessory muscles. Lower extremities demonstrate minimal edema multiple superficial wounds are present they appear clean dressings are dry there is no odor there is no drainage Pt. Ambulating, voiding and taking PO diet without difficulty. Pt pain controlled with PO pain meds. Labs as below of note cultures of the wound demonstrated Pseudomonas and the patient was switched from Unasyn to Zosyn for his 7 days of IV antibiotic therapy   Consults:  Treatment Team:  Milagros Loll, MD  Significant Diagnostic Studies: CBC Lab Results  Component Value Date   WBC 10.6 01/30/2015   HGB 13.9  01/30/2015   HCT 41.7 01/30/2015   MCV 101.3* 01/30/2015   PLT 170 01/30/2015    BMET    Component Value Date/Time   NA 138 02/01/2015 0450   NA 138 08/02/2014 0424   K 3.6 02/01/2015 0450   K 3.6 08/02/2014 0424   CL 94* 02/01/2015 0450   CL 103 08/02/2014 0424   CO2 33* 02/01/2015 0450   CO2 28 08/02/2014 0424   GLUCOSE 102* 02/01/2015 0450   GLUCOSE 89 08/02/2014 0424   BUN 22* 02/01/2015 0450   BUN 10 08/02/2014 0424   CREATININE 0.87 02/01/2015 0450   CREATININE 0.90 08/02/2014 0424   CALCIUM 8.3* 02/01/2015 0450   CALCIUM 8.3* 08/02/2014 0424   GFRNONAA >60 02/01/2015 0450   GFRNONAA >60 03/24/2014 0532   GFRAA >60 02/01/2015 0450   GFRAA >60 03/24/2014 0532   COAG Lab Results  Component Value Date   INR 2.2 07/02/2014   INR 1.3 06/26/2014   INR 1.6 05/20/2014     Disposition:  Discharge to :Skilled nursing facility    Medication List    TAKE these medications        traMADol 50 MG tablet  Commonly known as:  ULTRAM  Take 1 tablet (50 mg total) by mouth every 6 (six) hours as needed for moderate pain or severe pain. As needed for pain      ASK your doctor about these medications        amiodarone 200 MG tablet  Commonly known as:  PACERONE  Take 200 mg by mouth daily.     furosemide 20 MG tablet  Commonly known  as:  LASIX  Take 20 mg by mouth 2 (two) times daily.     metolazone 5 MG tablet  Commonly known as:  ZAROXOLYN  Take 5 mg by mouth daily. Take 30 minutes before AM lasix     metoprolol succinate 50 MG 24 hr tablet  Commonly known as:  TOPROL-XL  Take 25 mg by mouth daily.     pantoprazole 20 MG tablet  Commonly known as:  PROTONIX  Take 20 mg by mouth daily.     PRADAXA 150 MG Caps capsule  Generic drug:  dabigatran  Take 150 mg by mouth 2 (two) times daily.     tamsulosin 0.4 MG Caps capsule  Commonly known as:  FLOMAX  Take 0.4 mg by mouth daily.     VENTOLIN HFA 108 (90 BASE) MCG/ACT inhaler  Generic drug:  albuterol   Inhale 108 mcg into the lungs every 4 (four) hours as needed. 2 inhalations into lungs every four hours as needed for wheezing       Verbal and written Discharge instructions given to the patient. Wound care per Discharge AVS   Signed: Renford Dills, MD  02/03/2015, 7:29 AM

## 2015-02-07 ENCOUNTER — Other Ambulatory Visit: Payer: Self-pay

## 2015-02-17 ENCOUNTER — Emergency Department: Payer: Commercial Managed Care - HMO

## 2015-02-17 ENCOUNTER — Inpatient Hospital Stay
Admission: EM | Admit: 2015-02-17 | Discharge: 2015-03-04 | DRG: 853 | Disposition: A | Discharge status: Hospice | Payer: Commercial Managed Care - HMO | Attending: Internal Medicine | Admitting: Internal Medicine

## 2015-02-17 ENCOUNTER — Inpatient Hospital Stay: Payer: Commercial Managed Care - HMO

## 2015-02-17 ENCOUNTER — Encounter: Payer: Self-pay | Admitting: Emergency Medicine

## 2015-02-17 DIAGNOSIS — N170 Acute kidney failure with tubular necrosis: Secondary | ICD-10-CM | POA: Diagnosis present

## 2015-02-17 DIAGNOSIS — E876 Hypokalemia: Secondary | ICD-10-CM | POA: Diagnosis present

## 2015-02-17 DIAGNOSIS — Z66 Do not resuscitate: Secondary | ICD-10-CM | POA: Diagnosis present

## 2015-02-17 DIAGNOSIS — K224 Dyskinesia of esophagus: Secondary | ICD-10-CM | POA: Diagnosis present

## 2015-02-17 DIAGNOSIS — E870 Hyperosmolality and hypernatremia: Secondary | ICD-10-CM | POA: Diagnosis present

## 2015-02-17 DIAGNOSIS — R1310 Dysphagia, unspecified: Secondary | ICD-10-CM

## 2015-02-17 DIAGNOSIS — I429 Cardiomyopathy, unspecified: Secondary | ICD-10-CM | POA: Diagnosis present

## 2015-02-17 DIAGNOSIS — I5022 Chronic systolic (congestive) heart failure: Secondary | ICD-10-CM | POA: Diagnosis present

## 2015-02-17 DIAGNOSIS — J189 Pneumonia, unspecified organism: Secondary | ICD-10-CM | POA: Diagnosis present

## 2015-02-17 DIAGNOSIS — N179 Acute kidney failure, unspecified: Secondary | ICD-10-CM

## 2015-02-17 DIAGNOSIS — I7389 Other specified peripheral vascular diseases: Secondary | ICD-10-CM | POA: Diagnosis present

## 2015-02-17 DIAGNOSIS — K219 Gastro-esophageal reflux disease without esophagitis: Secondary | ICD-10-CM | POA: Diagnosis present

## 2015-02-17 DIAGNOSIS — R102 Pelvic and perineal pain: Secondary | ICD-10-CM | POA: Diagnosis not present

## 2015-02-17 DIAGNOSIS — I472 Ventricular tachycardia: Secondary | ICD-10-CM | POA: Diagnosis not present

## 2015-02-17 DIAGNOSIS — L89153 Pressure ulcer of sacral region, stage 3: Secondary | ICD-10-CM | POA: Diagnosis present

## 2015-02-17 DIAGNOSIS — R6521 Severe sepsis with septic shock: Secondary | ICD-10-CM | POA: Diagnosis present

## 2015-02-17 DIAGNOSIS — J9601 Acute respiratory failure with hypoxia: Secondary | ICD-10-CM | POA: Diagnosis present

## 2015-02-17 DIAGNOSIS — F0390 Unspecified dementia without behavioral disturbance: Secondary | ICD-10-CM | POA: Diagnosis present

## 2015-02-17 DIAGNOSIS — Z885 Allergy status to narcotic agent status: Secondary | ICD-10-CM | POA: Diagnosis not present

## 2015-02-17 DIAGNOSIS — Z7901 Long term (current) use of anticoagulants: Secondary | ICD-10-CM | POA: Diagnosis not present

## 2015-02-17 DIAGNOSIS — Y95 Nosocomial condition: Secondary | ICD-10-CM | POA: Diagnosis present

## 2015-02-17 DIAGNOSIS — I10 Essential (primary) hypertension: Secondary | ICD-10-CM | POA: Diagnosis present

## 2015-02-17 DIAGNOSIS — Z96653 Presence of artificial knee joint, bilateral: Secondary | ICD-10-CM | POA: Diagnosis present

## 2015-02-17 DIAGNOSIS — I482 Chronic atrial fibrillation: Secondary | ICD-10-CM | POA: Diagnosis present

## 2015-02-17 DIAGNOSIS — E872 Acidosis: Secondary | ICD-10-CM | POA: Diagnosis present

## 2015-02-17 DIAGNOSIS — Z79891 Long term (current) use of opiate analgesic: Secondary | ICD-10-CM | POA: Diagnosis not present

## 2015-02-17 DIAGNOSIS — R1312 Dysphagia, oropharyngeal phase: Secondary | ICD-10-CM | POA: Diagnosis present

## 2015-02-17 DIAGNOSIS — L0390 Cellulitis, unspecified: Secondary | ICD-10-CM | POA: Diagnosis present

## 2015-02-17 DIAGNOSIS — A419 Sepsis, unspecified organism: Principal | ICD-10-CM | POA: Diagnosis present

## 2015-02-17 DIAGNOSIS — E860 Dehydration: Secondary | ICD-10-CM | POA: Diagnosis present

## 2015-02-17 DIAGNOSIS — I959 Hypotension, unspecified: Secondary | ICD-10-CM | POA: Diagnosis present

## 2015-02-17 DIAGNOSIS — Z881 Allergy status to other antibiotic agents status: Secondary | ICD-10-CM | POA: Diagnosis not present

## 2015-02-17 DIAGNOSIS — I771 Stricture of artery: Secondary | ICD-10-CM | POA: Diagnosis present

## 2015-02-17 DIAGNOSIS — L89152 Pressure ulcer of sacral region, stage 2: Secondary | ICD-10-CM | POA: Diagnosis present

## 2015-02-17 DIAGNOSIS — I96 Gangrene, not elsewhere classified: Secondary | ICD-10-CM | POA: Diagnosis present

## 2015-02-17 DIAGNOSIS — B964 Proteus (mirabilis) (morganii) as the cause of diseases classified elsewhere: Secondary | ICD-10-CM | POA: Diagnosis present

## 2015-02-17 DIAGNOSIS — F05 Delirium due to known physiological condition: Secondary | ICD-10-CM | POA: Diagnosis present

## 2015-02-17 DIAGNOSIS — I471 Supraventricular tachycardia: Secondary | ICD-10-CM | POA: Diagnosis present

## 2015-02-17 DIAGNOSIS — N400 Enlarged prostate without lower urinary tract symptoms: Secondary | ICD-10-CM | POA: Diagnosis present

## 2015-02-17 DIAGNOSIS — G4700 Insomnia, unspecified: Secondary | ICD-10-CM | POA: Diagnosis present

## 2015-02-17 DIAGNOSIS — E43 Unspecified severe protein-calorie malnutrition: Secondary | ICD-10-CM | POA: Diagnosis present

## 2015-02-17 DIAGNOSIS — L8990 Pressure ulcer of unspecified site, unspecified stage: Secondary | ICD-10-CM | POA: Insufficient documentation

## 2015-02-17 DIAGNOSIS — IMO0001 Reserved for inherently not codable concepts without codable children: Secondary | ICD-10-CM | POA: Insufficient documentation

## 2015-02-17 LAB — BLOOD GAS, ARTERIAL
Acid-Base Excess: 3.5 mmol/L — ABNORMAL HIGH (ref 0.0–3.0)
Allens test (pass/fail): POSITIVE — AB
Bicarbonate: 27.8 mEq/L (ref 21.0–28.0)
FIO2: 0.4 %
O2 SAT: 98.7 %
PCO2 ART: 40 mmHg (ref 32.0–48.0)
Patient temperature: 37
pH, Arterial: 7.45 (ref 7.350–7.450)
pO2, Arterial: 115 mmHg — ABNORMAL HIGH (ref 83.0–108.0)

## 2015-02-17 LAB — BRAIN NATRIURETIC PEPTIDE: B NATRIURETIC PEPTIDE 5: 253 pg/mL — AB (ref 0.0–100.0)

## 2015-02-17 LAB — URINALYSIS COMPLETE WITH MICROSCOPIC (ARMC ONLY)
Bilirubin Urine: NEGATIVE
GLUCOSE, UA: NEGATIVE mg/dL
Ketones, ur: NEGATIVE mg/dL
Leukocytes, UA: NEGATIVE
Nitrite: NEGATIVE
Protein, ur: NEGATIVE mg/dL
Specific Gravity, Urine: 1.016 (ref 1.005–1.030)
pH: 5 (ref 5.0–8.0)

## 2015-02-17 LAB — GLUCOSE, CAPILLARY
GLUCOSE-CAPILLARY: 65 mg/dL (ref 65–99)
Glucose-Capillary: 186 mg/dL — ABNORMAL HIGH (ref 65–99)
Glucose-Capillary: 79 mg/dL (ref 65–99)
Glucose-Capillary: 75 mg/dL (ref 65–99)

## 2015-02-17 LAB — COMPREHENSIVE METABOLIC PANEL
ALBUMIN: 2.2 g/dL — AB (ref 3.5–5.0)
ALT: 51 U/L (ref 17–63)
AST: 131 U/L — AB (ref 15–41)
Alkaline Phosphatase: 197 U/L — ABNORMAL HIGH (ref 38–126)
Anion gap: 13 (ref 5–15)
BILIRUBIN TOTAL: 2 mg/dL — AB (ref 0.3–1.2)
BUN: 91 mg/dL — AB (ref 6–20)
CHLORIDE: 107 mmol/L (ref 101–111)
CO2: 29 mmol/L (ref 22–32)
Calcium: 8.7 mg/dL — ABNORMAL LOW (ref 8.9–10.3)
Creatinine, Ser: 3.2 mg/dL — ABNORMAL HIGH (ref 0.61–1.24)
GFR calc Af Amer: 20 mL/min — ABNORMAL LOW (ref >60)
GFR, EST NON AFRICAN AMERICAN: 17 mL/min — AB (ref >60)
GLUCOSE: 221 mg/dL — AB (ref 65–99)
POTASSIUM: 4.7 mmol/L (ref 3.5–5.1)
Sodium: 149 mmol/L — ABNORMAL HIGH (ref 135–145)
Total Protein: 6.7 g/dL (ref 6.5–8.1)

## 2015-02-17 LAB — LACTIC ACID, PLASMA
LACTIC ACID, VENOUS: 2.9 mmol/L — AB (ref 0.5–2.0)
LACTIC ACID, VENOUS: 2.8 mmol/L — AB (ref 0.5–2.0)
Lactic Acid, Venous: 5.2 mmol/L (ref 0.5–2.0)

## 2015-02-17 LAB — TROPONIN I: TROPONIN I: 0.03 ng/mL (ref <0.031)

## 2015-02-17 LAB — CBC
HCT: 48.2 % (ref 40.0–52.0)
HEMOGLOBIN: 15.7 g/dL (ref 13.0–18.0)
MCH: 32.9 pg (ref 26.0–34.0)
MCHC: 32.6 g/dL (ref 32.0–36.0)
MCV: 101 fL — ABNORMAL HIGH (ref 80.0–100.0)
Platelets: 224 10*3/uL (ref 150–440)
RBC: 4.77 MIL/uL (ref 4.40–5.90)
RDW: 17.2 % — AB (ref 11.5–14.5)
WBC: 8.9 10*3/uL (ref 3.8–10.6)

## 2015-02-17 LAB — MRSA PCR SCREENING: MRSA by PCR: POSITIVE — AB

## 2015-02-17 MED ORDER — PIPERACILLIN-TAZOBACTAM 3.375 G IVPB 30 MIN
3.3750 g | Freq: Three times a day (TID) | INTRAVENOUS | Status: DC
  Administered 2015-02-17 – 2015-02-19 (×6): 3.375 g via INTRAVENOUS
  Filled 2015-02-17 (×9): qty 50

## 2015-02-17 MED ORDER — AMIODARONE HCL 200 MG PO TABS
200.0000 mg | ORAL_TABLET | Freq: Every day | ORAL | Status: DC
  Administered 2015-02-18 – 2015-03-04 (×14): 200 mg via ORAL
  Filled 2015-02-17 (×15): qty 1

## 2015-02-17 MED ORDER — PANTOPRAZOLE SODIUM 20 MG PO TBEC
20.0000 mg | DELAYED_RELEASE_TABLET | Freq: Every day | ORAL | Status: DC
  Filled 2015-02-17 (×2): qty 1

## 2015-02-17 MED ORDER — SODIUM CHLORIDE 0.9 % IV BOLUS (SEPSIS)
1000.0000 mL | Freq: Once | INTRAVENOUS | Status: AC
  Administered 2015-02-17: 1000 mL via INTRAVENOUS

## 2015-02-17 MED ORDER — PIPERACILLIN-TAZOBACTAM 3.375 G IVPB
3.3750 g | Freq: Once | INTRAVENOUS | Status: AC
  Administered 2015-02-17: 3.375 g via INTRAVENOUS

## 2015-02-17 MED ORDER — TAMSULOSIN HCL 0.4 MG PO CAPS
0.4000 mg | ORAL_CAPSULE | Freq: Every day | ORAL | Status: DC
  Administered 2015-02-18 – 2015-03-04 (×14): 0.4 mg via ORAL
  Filled 2015-02-17 (×15): qty 1

## 2015-02-17 MED ORDER — PANTOPRAZOLE SODIUM 40 MG PO TBEC
40.0000 mg | DELAYED_RELEASE_TABLET | Freq: Every day | ORAL | Status: DC
  Administered 2015-02-19 – 2015-03-04 (×13): 40 mg via ORAL
  Filled 2015-02-17 (×15): qty 1

## 2015-02-17 MED ORDER — NOREPINEPHRINE BITARTRATE 1 MG/ML IV SOLN
0.0000 ug/min | INTRAVENOUS | Status: DC
  Administered 2015-02-18: 2 ug/min via INTRAVENOUS
  Administered 2015-02-18: 5 ug/min via INTRAVENOUS
  Filled 2015-02-17 (×3): qty 16

## 2015-02-17 MED ORDER — SODIUM CHLORIDE 0.45 % IV SOLN
INTRAVENOUS | Status: DC
  Administered 2015-02-17: 17:00:00 via INTRAVENOUS

## 2015-02-17 MED ORDER — LEVOFLOXACIN IN D5W 750 MG/150ML IV SOLN
750.0000 mg | Freq: Once | INTRAVENOUS | Status: AC
  Administered 2015-02-17: 750 mg via INTRAVENOUS

## 2015-02-17 MED ORDER — POLYETHYLENE GLYCOL 3350 17 G PO PACK
17.0000 g | PACK | Freq: Every day | ORAL | Status: DC
  Filled 2015-02-17: qty 1

## 2015-02-17 MED ORDER — DIAZEPAM 5 MG/ML IJ SOLN
5.0000 mg | INTRAMUSCULAR | Status: DC | PRN
  Administered 2015-02-17 – 2015-02-22 (×5): 5 mg via INTRAVENOUS
  Filled 2015-02-17 (×6): qty 2

## 2015-02-17 MED ORDER — SODIUM CHLORIDE 0.9 % IV BOLUS (SEPSIS)
500.0000 mL | Freq: Once | INTRAVENOUS | Status: AC
  Administered 2015-02-17: 500 mL via INTRAVENOUS

## 2015-02-17 MED ORDER — DABIGATRAN ETEXILATE MESYLATE 150 MG PO CAPS: 150.0000 mg | ORAL_CAPSULE | Freq: Two times a day (BID) | ORAL | Status: DC

## 2015-02-17 MED ORDER — VANCOMYCIN HCL IN DEXTROSE 1-5 GM/200ML-% IV SOLN
1000.0000 mg | Freq: Once | INTRAVENOUS | Status: AC
  Administered 2015-02-17: 1000 mg via INTRAVENOUS

## 2015-02-17 MED ORDER — TRAMADOL HCL 50 MG PO TABS
50.0000 mg | ORAL_TABLET | Freq: Two times a day (BID) | ORAL | Status: DC | PRN
  Administered 2015-02-28 – 2015-03-02 (×2): 50 mg via ORAL
  Filled 2015-02-17 (×2): qty 1

## 2015-02-17 MED ORDER — COLLAGENASE 250 UNIT/GM EX OINT
TOPICAL_OINTMENT | Freq: Every day | CUTANEOUS | Status: AC
  Administered 2015-02-17 – 2015-03-02 (×14): via TOPICAL
  Filled 2015-02-17 (×2): qty 30

## 2015-02-17 MED ORDER — NOREPINEPHRINE 4 MG/250ML-% IV SOLN
0.0000 ug/min | INTRAVENOUS | Status: DC
  Administered 2015-02-17 (×2): 5.013 ug/min via INTRAVENOUS

## 2015-02-17 MED ORDER — VANCOMYCIN HCL IN DEXTROSE 1-5 GM/200ML-% IV SOLN
INTRAVENOUS | Status: AC
  Filled 2015-02-17: qty 200

## 2015-02-17 MED ORDER — SODIUM CHLORIDE 0.9 % IV SOLN
INTRAVENOUS | Status: DC
  Administered 2015-02-17: 11:00:00 via INTRAVENOUS

## 2015-02-17 MED ORDER — MORPHINE SULFATE 2 MG/ML IJ SOLN
2.0000 mg | INTRAMUSCULAR | Status: DC | PRN
  Administered 2015-02-17 – 2015-03-02 (×22): 2 mg via INTRAVENOUS
  Filled 2015-02-17 (×21): qty 1

## 2015-02-17 MED ORDER — MORPHINE SULFATE 2 MG/ML IJ SOLN
INTRAMUSCULAR | Status: AC
  Administered 2015-02-17: 2 mg via INTRAVENOUS
  Filled 2015-02-17: qty 1

## 2015-02-17 MED ORDER — PIPERACILLIN-TAZOBACTAM 3.375 G IVPB
INTRAVENOUS | Status: AC
  Administered 2015-02-17: 3.375 g via INTRAVENOUS
  Filled 2015-02-17: qty 50

## 2015-02-17 MED ORDER — LEVOFLOXACIN IN D5W 750 MG/150ML IV SOLN
INTRAVENOUS | Status: AC
  Administered 2015-02-17: 750 mg via INTRAVENOUS
  Filled 2015-02-17: qty 150

## 2015-02-17 MED ORDER — MUPIROCIN 2 % EX OINT
1.0000 "application " | TOPICAL_OINTMENT | Freq: Two times a day (BID) | CUTANEOUS | Status: AC
  Administered 2015-02-17 – 2015-02-21 (×10): 1 via NASAL
  Filled 2015-02-17 (×2): qty 22

## 2015-02-17 MED ORDER — SODIUM CHLORIDE 0.9 % IV SOLN
INTRAVENOUS | Status: DC
Start: 2015-02-17 — End: 2015-02-18
  Administered 2015-02-17 (×2): via INTRAVENOUS

## 2015-02-17 MED ORDER — VANCOMYCIN HCL IN DEXTROSE 1-5 GM/200ML-% IV SOLN
1000.0000 mg | INTRAVENOUS | Status: DC
  Administered 2015-02-17: 1000 mg via INTRAVENOUS
  Filled 2015-02-17 (×2): qty 200

## 2015-02-17 MED ORDER — CHLORHEXIDINE GLUCONATE CLOTH 2 % EX PADS
6.0000 | MEDICATED_PAD | Freq: Every day | CUTANEOUS | Status: AC
  Administered 2015-02-17 – 2015-02-21 (×4): 6 via TOPICAL

## 2015-02-17 MED ORDER — HEPARIN SODIUM (PORCINE) 5000 UNIT/ML IJ SOLN
5000.0000 [IU] | Freq: Three times a day (TID) | INTRAMUSCULAR | Status: DC
  Administered 2015-02-17 – 2015-03-04 (×44): 5000 [IU] via SUBCUTANEOUS
  Filled 2015-02-17 (×44): qty 1

## 2015-02-17 MED ORDER — NOREPINEPHRINE 4 MG/250ML-% IV SOLN
INTRAVENOUS | Status: AC
  Administered 2015-02-17: 5.013 ug/min via INTRAVENOUS
  Filled 2015-02-17: qty 250

## 2015-02-17 MED ORDER — NOREPINEPHRINE BITARTRATE 1 MG/ML IV SOLN
0.0000 ug/min | INTRAVENOUS | Status: DC
  Administered 2015-02-17: 5 ug/min via INTRAVENOUS

## 2015-02-17 NOTE — H&P (Addendum)
[facility] [place] [facility]   PATIENT NAME: Ronald Good    MR#:  [id number]  DATE OF BIRTH:  Sep 01, 1933  DATE OF ADMISSION:  02/17/2015  PRIMARY CARE PHYSICIAN: Ronald Ivan, MD   REQUESTING/REFERRING PHYSICIAN: Dr. Zenda Good.  CHIEF COMPLAINT:   Chief Complaint  Patient presents with  . Altered Mental Status    HISTORY OF PRESENT ILLNESS:  Ronald Good  is a 81 y.o. male with a known history of CHF, Afib, HTN and recent leg cellulitis. He was found to have hypoxia with SAT at 70% and low blood sugar at 40's in SNF. He is confused, only complaint of thirsty and weakness. BP dropped to 70/46 in ED. He is found to have ARF and dehydration. CXR: right pneumonia. He is being treated with NS bolus and antibiotics including vancomycin, zosyn and levaquin. He was hospitalized for leg cellulitis last month.  PAST MEDICAL HISTORY:   Past Medical History  Diagnosis Date  . Chronic systolic congestive heart failure   . BPH (benign prostatic hyperplasia)   . Atrial fibrillation   . Essential hypertension   . Gastroesophageal reflux disease     PAST SURGICAL HISTORY:   Past Surgical History  Procedure Laterality Date  . Replacement total knee bilateral Bilateral     SOCIAL HISTORY:   History  Substance Use Topics  . Smoking status: Never Smoker   . Smokeless tobacco: Not on file  . Alcohol Use: No    FAMILY HISTORY:  History reviewed. No pertinent family history. Both parents deceased.   DRUG ALLERGIES:   Allergies  Allergen Reactions  . Oxycodone Other (See Comments)    "makes me loopy"  . Neomycin-Bacitracin Zn-Polymyx Rash    REVIEW OF SYSTEMS:  CONSTITUTIONAL: No fever, generalized weakness and thirsty.  EYES: No blurred or double vision.  EARS, NOSE, AND THROAT: No tinnitus or ear pain.  RESPIRATORY: No cough, shortness of breath, wheezing or hemoptysis.  CARDIOVASCULAR: No chest pain, orthopnea, edema.   GASTROINTESTINAL: No nausea, vomiting, diarrhea or abdominal pain.  GENITOURINARY: No dysuria, hematuria.  ENDOCRINE: No polyuria, nocturia,  HEMATOLOGY: No anemia, easy bruising or bleeding SKIN: No rash or lesion. MUSCULOSKELETAL: No joint pain or arthritis.   NEUROLOGIC: No tingling, numbness, weakness.   MEDICATIONS AT HOME:   Prior to Admission medications   Medication Sig Start Date End Date Taking? Authorizing Provider  amiodarone (PACERONE) 200 MG tablet Take 200 mg by mouth daily. 11/19/14  Yes Historical Provider, MD  dabigatran (PRADAXA) 150 MG CAPS capsule Take 150 mg by mouth 2 (two) times daily. 10/11/14  Yes Historical Provider, MD  furosemide (LASIX) 20 MG tablet Take 20 mg by mouth 2 (two) times daily. 07/14/14  Yes Historical Provider, MD  metolazone (ZAROXOLYN) 5 MG tablet Take 5 mg by mouth daily. Take 30 minutes before AM lasix 12/02/14  Yes Historical Provider, MD  metoprolol succinate (TOPROL-XL) 50 MG 24 hr tablet Take 25 mg by mouth daily.   Yes Historical Provider, MD  oxyCODONE-acetaminophen (PERCOCET/ROXICET) 5-325 MG per tablet Take 2 tablets by mouth every 4 (four) hours as needed for severe pain (For pain with dressing change). Give 2 tablets by mount 40 minutes before dressing change.   Yes Historical Provider, MD  pantoprazole (PROTONIX) 20 MG tablet Take 20 mg by mouth daily. 12/13/14  Yes Historical Provider, MD  polyethylene glycol (MIRALAX / GLYCOLAX) packet Take 17 g by mouth daily. 02/03/15  Yes Renford Dills, MD  potassium chloride  SA (K-DUR,KLOR-CON) 20 MEQ tablet Take 20 mEq by mouth daily.   Yes Historical Provider, MD  tamsulosin (FLOMAX) 0.4 MG CAPS capsule Take 0.4 mg by mouth daily. 01/24/15  Yes Historical Provider, MD  traMADol (ULTRAM) 50 MG tablet Take 2 tablets (100 mg total) by mouth every 6 (six) hours as needed for moderate pain. 02/03/15  Yes Renford Dills, MD  albuterol (VENTOLIN HFA) 108 (90 BASE) MCG/ACT inhaler Inhale 108 mcg into the  lungs every 4 (four) hours as needed. 2 inhalations into lungs every four hours as needed for wheezing    Historical Provider, MD  hydrOXYzine (ATARAX/VISTARIL) 10 MG tablet Take 1 tablet (10 mg total) by mouth 3 (three) times daily. 02/03/15   Renford Dills, MD  silver sulfADIAZINE (SILVADENE) 1 % cream Apply topically 2 (two) times daily. 02/03/15   Renford Dills, MD  traMADol (ULTRAM) 50 MG tablet Take 1 tablet (50 mg total) by mouth every 6 (six) hours as needed for moderate pain or severe pain. As needed for pain 02/02/15   Milagros Loll, MD      VITAL SIGNS:  Blood pressure 70/46, pulse 100, resp. rate 20, height 6\' 2"  (1.88 m), weight 76.204 kg (168 lb), SpO2 100 %.  PHYSICAL EXAMINATION:  GENERAL:  81 y.o.-year-old patient lying in the bed with no acute distress.  EYES: Pupils equal, round, reactive to light and accommodation. No scleral icterus. Extraocular muscles intact.  HEENT: Head atraumatic, normocephalic. Oropharynx and nasopharynx clear. Very dry oral mucosa. NECK:  Supple, no jugular venous distention. No thyroid enlargement, no tenderness.  LUNGS: Normal breath sounds bilaterally, no wheezing, rales,rhonchi or crepitation. No use of accessory muscles of respiration.  CARDIOVASCULAR: S1, S2 irregular.  No murmurs, rubs, or gallops.  ABDOMEN: Soft, nontender, nondistended. Bowel sounds present. No organomegaly or mass.  EXTREMITIES: No pedal edema, cyanosis, or clubbing. Bilateral leg in dressing. Sacral decubitus ulcer stage 2. NEUROLOGIC: Cranial nerves II through XII are intact. Muscle strength 2/5 in all extremities. Sensation intact. Gait not checked.  PSYCHIATRIC: The patient is alert and oriented x 2.  SKIN: No obvious rash, lesion, or ulcer. Very poor skin turgor.  LABORATORY PANEL:   CBC  Recent Labs Lab 02/17/15 0214  WBC 8.9  HGB 15.7  HCT 48.2  PLT 224    ------------------------------------------------------------------------------------------------------------------  Chemistries   Recent Labs Lab 02/17/15 0214  NA 149*  K 4.7  CL 107  CO2 29  GLUCOSE 221*  BUN 91*  CREATININE 3.20*  CALCIUM 8.7*  AST 131*  ALT 51  ALKPHOS 197*  BILITOT 2.0*   ------------------------------------------------------------------------------------------------------------------  Cardiac Enzymes  Recent Labs Lab 02/17/15 0214  TROPONINI 0.03   ------------------------------------------------------------------------------------------------------------------  RADIOLOGY:  Ct Head Wo Contrast  02/17/2015   CLINICAL DATA:  Initial evaluation for acute altered mental status, unresponsive.  EXAM: CT HEAD WITHOUT CONTRAST  TECHNIQUE: Contiguous axial images were obtained from the base of the skull through the vertex without intravenous contrast.  COMPARISON:  Prior CT from 12/15/2013  FINDINGS: Study is degraded by motion artifact.  Diffuse prominence of the CSF containing spaces is compatible with generalized cerebral atrophy. Patchy and confluent hypodensity within the periventricular and deep white matter both cerebral hemispheres most consistent with chronic small vessel ischemic disease. Prominent vascular calcifications within the carotid siphons.  No acute large vessel territory infarct identified. No acute intracranial hemorrhage. No mass lesion, midline shift, or mass effect. Ventricular prominence related to global parenchymal volume loss present without hydrocephalus. No  extra-axial fluid collection.  Scalp soft tissues within normal limits. No acute abnormality about the orbits.  Calvarium intact.  Paranasal sinuses and mastoid air cells are well pneumatized and free of fluid. Middle ear cavities are clear.  IMPRESSION: 1. No acute intracranial process identified. 2. Advanced age-related cerebral atrophy with chronic microvascular ischemic disease.    Electronically Signed   By: Rise MuBenjamin  McClintock M.D.   On: 02/17/2015 06:51   Dg Chest Portable 1 View  02/17/2015   CLINICAL DATA:  Hypoxia.  Found unresponsive and hypoglycemic.  EXAM: PORTABLE CHEST - 1 VIEW  COMPARISON:  07/02/2014  FINDINGS: Chronic cardiomegaly. Stable and negative aortic and hilar contours. There is a streaky right infrahilar lung opacity. No edema, effusion, or pneumothorax.  IMPRESSION: Right infrahilar opacity could reflect atelectasis, pneumonia, or aspiration.   Electronically Signed   By: Marnee SpringJonathon  Watts M.D.   On: 02/17/2015 03:32    EKG:   Orders placed or performed during the hospital encounter of 02/17/15  . EKG 12-Lead  . EKG 12-Lead    IMPRESSION AND PLAN:  HAP Hypotension ARF Hypernatremia Lactic acidosis Sacral decubitus ulcer stage 2. AFib. Chronic systolic CHF.   Stepdown.  Continue vancomycin and zosyn, follow up CBC and cultures. NS iv bolus then 75 ml/h. Follow up BMP. Hold lasix, toprol and metolazone. Kidney US.  Continue amiodarone and hold pradaxa due to ARF. Heparin SQ for now and cardiology consult. Wound care for Sacral decubitus ulcer stage 2 and leg wound.   All the records are reviewed and case discussed with ED provider. Management plans discussed with the patient, family and they are in agreement.  CODE STATUS: FULL CODE  TOTAL CRITICAL TIME TAKING CARE OF THIS PATIENT: 62 minutes.    Shaune Pollackhen, Leolia Vinzant M.D on 02/17/2015 at 7:57 AM  Between 7am to 6pm - Pager - [phone]  After 6pm go to [email or web address] - password [email or web address] [facility]  [facility] [place] [facility]  Office  [phone]  CC: Primary care physician; Ronald IvanLINTHAVONG, KANHKA, MD

## 2015-02-17 NOTE — Procedures (Signed)
Central Venous Catheter Placement: Indication: Patient receiving vesicant or irritant drug.; Patient receiving intravenous therapy for longer than 5 days.; Patient has limited or no vascular access.   Consent:verbal Risks and benefits explained in detail including risk of infection, bleeding, respiratory failure and death..   Hand washing performed prior to starting the procedure.   Procedure: An active timeout was performed and correct patient, name, & ID confirmed.  After explaining risk and benefits, patient was positioned correctly for central venous access. Patient was prepped using strict sterile technique including chlorohexadine preps, sterile drape, sterile gown and sterile gloves.  The area was prepped, draped and anesthetized in the usual sterile manner. Patient comfort was obtained.  A triple lumen catheter was placed in RT Internal Jugular Vein There was good blood return, catheter caps were placed on lumens, catheter flushed easily, the line was secured and a sterile dressing and BIO-PATCH applied.   Ultrasound was used to visualize vasculature and guidance of needle.   Number of Attempts:1 Complications:none Estimated Blood Loss: none Chest Radiograph indicated and ordered.  Procedure time 12 mins Operator: Ronald Good.   Ronald Good, M.D. Pulmonary & Critical Care Medicine [facility] Medical Director Intensive Care Unit

## 2015-02-17 NOTE — ED Notes (Signed)
Pt arrived to the ED via EMS from [facility] after being found unresponsive. EMS reports that the staff of [facility] went to check on the Pt's roommate and found the Pt unresponsive when EMS arrived to the sceen they found the BG to be 40; after an AMP of D50 the BG 240. Pt responds to pain and some commands. Unable to answer questions, non-verbal.

## 2015-02-17 NOTE — ED Notes (Signed)
Patient resting in stretcher. Respirations even and unlabored. No obvious distress. Cardiac monitor in place. No needs/concerns verbalized at this time. Call bell within reach. Encouraged to call with needs. Will continue to monitor. 

## 2015-02-17 NOTE — ED Provider Notes (Signed)
[facility] Emergency Department Provider Note  ____________________________________________  Time seen: Approximately 213 AM  I have reviewed the triage vital signs and the nursing notes.   HISTORY  Chief Complaint Altered Mental Status  The patient has altered mental status and is unable to participate in the history.  HPI Ronald Good is a 81 y.o. male who was found unresponsive at his nursing home. According to EMS the patient had sats in the 70s and a fingerstick blood sugar in the 40s. The patient was given sugar by mouth and sent here by EMS. The patient is full code.The patient attempts to answer question but his speech is not understandable.   Past Medical History  Diagnosis Date  . Chronic systolic congestive heart failure   . BPH (benign prostatic hyperplasia)   . Atrial fibrillation   . Essential hypertension   . Gastroesophageal reflux disease     Patient Active Problem List   Diagnosis Date Noted  . Pneumonia 02/17/2015  . ARF (acute renal failure) 02/17/2015  . Hypernatremia 02/17/2015  . Hypotension 02/17/2015  . Essential (primary) hypertension 01/27/2015  . Cellulitis and abscess of lower extremity 01/27/2015  . Breathlessness on exertion 12/02/2014  . Acute on chronic systolic heart failure 07/07/2014  . Atrial fibrillation, chronic 12/07/2013  . Benign prostatic hyperplasia with urinary obstruction 03/10/2013  . Calculus of kidney 03/10/2013  . FOM (frequency of micturition) 03/10/2013  . Delayed onset of urination 03/10/2013    Past Surgical History  Procedure Laterality Date  . Replacement total knee bilateral Bilateral     Current Outpatient Rx  Name  Route  Sig  Dispense  Refill  . amiodarone (PACERONE) 200 MG tablet   Oral   Take 200 mg by mouth daily.         . dabigatran (PRADAXA) 150 MG CAPS capsule   Oral   Take 150 mg by mouth 2 (two) times daily.         . furosemide (LASIX) 20 MG tablet    Oral   Take 20 mg by mouth 2 (two) times daily.         . metolazone (ZAROXOLYN) 5 MG tablet   Oral   Take 5 mg by mouth daily. Take 30 minutes before AM lasix         . metoprolol succinate (TOPROL-XL) 50 MG 24 hr tablet   Oral   Take 25 mg by mouth daily.         Marland Kitchen oxyCODONE-acetaminophen (PERCOCET/ROXICET) 5-325 MG per tablet   Oral   Take 2 tablets by mouth every 4 (four) hours as needed for severe pain (For pain with dressing change). Give 2 tablets by mount 40 minutes before dressing change.         . pantoprazole (PROTONIX) 20 MG tablet   Oral   Take 20 mg by mouth daily.         . polyethylene glycol (MIRALAX / GLYCOLAX) packet   Oral   Take 17 g by mouth daily.   14 each   0   . potassium chloride SA (K-DUR,KLOR-CON) 20 MEQ tablet   Oral   Take 20 mEq by mouth daily.         . tamsulosin (FLOMAX) 0.4 MG CAPS capsule   Oral   Take 0.4 mg by mouth daily.         . traMADol (ULTRAM) 50 MG tablet   Oral   Take 2 tablets (100 mg total) by  mouth every 6 (six) hours as needed for moderate pain.   30 tablet   1   . albuterol (VENTOLIN HFA) 108 (90 BASE) MCG/ACT inhaler   Inhalation   Inhale 108 mcg into the lungs every 4 (four) hours as needed. 2 inhalations into lungs every four hours as needed for wheezing         . hydrOXYzine (ATARAX/VISTARIL) 10 MG tablet   Oral   Take 1 tablet (10 mg total) by mouth 3 (three) times daily.   30 tablet   0   . silver sulfADIAZINE (SILVADENE) 1 % cream   Topical   Apply topically 2 (two) times daily.   50 g   0   . traMADol (ULTRAM) 50 MG tablet   Oral   Take 1 tablet (50 mg total) by mouth every 6 (six) hours as needed for moderate pain or severe pain. As needed for pain   30 tablet   0     Allergies Oxycodone and Neomycin-bacitracin zn-polymyx  History reviewed. No pertinent family history.  Social History History  Substance Use Topics  . Smoking status: Never Smoker   . Smokeless tobacco:  Not on file  . Alcohol Use: No    Review of Systems  Unable to assess due to patient's altered mental status  ____________________________________________   PHYSICAL EXAM:  VITAL SIGNS: ED Triage Vitals  Enc Vitals Group     BP 02/17/15 0224 124/105 mmHg     Pulse Rate 02/17/15 0224 121     Resp 02/17/15 0224 16     Temp --      Temp src --      SpO2 02/17/15 0224 94 %     Weight 02/17/15 0224 168 lb (76.204 kg)     Height 02/17/15 0224 6\' 2"  (1.88 m)     Head Cir --      Peak Flow --      Pain Score --      Pain Loc --      Pain Edu? --      Excl. in GC? --     Constitutional: Confused and Ill appearing, cachectic appearing  Eyes: Conjunctivae are normal. PERRL. EOMI. Head: Atraumatic. Nose: No congestion/rhinnorhea. Mouth/Throat: Dry mucous membranes.  Oropharynx non-erythematous. Cardiovascular: Irregularly irregular with intermittent tachycardia. Grossly normal heart sounds.  Good peripheral circulation. Respiratory: Normal respiratory effort.  No retractions. Lungs CTAB. Gastrointestinal: Soft and nontender. No distention. Positive bowel sounds. Genitourinary: normal external male genitalia Musculoskeletal: Lower extremities wrapped,  No joint effusions. Neurologic:  Patient able to follow commands, patient withdraws to pain attempts to speak but unable to form words. Skin:  Skin is very dry Psychiatric: mood and affect unremakable  ____________________________________________   LABS (all labs ordered are listed, but only abnormal results are displayed)  Labs Reviewed  GLUCOSE, CAPILLARY - Abnormal; Notable for the following:    Glucose-Capillary 186 (*)    All other components within normal limits  CBC - Abnormal; Notable for the following:    MCV 101.0 (*)    RDW 17.2 (*)    All other components within normal limits  COMPREHENSIVE METABOLIC PANEL - Abnormal; Notable for the following:    Sodium 149 (*)    Glucose, Bld 221 (*)    BUN 91 (*)     Creatinine, Ser 3.20 (*)    Calcium 8.7 (*)    Albumin 2.2 (*)    AST 131 (*)    Alkaline Phosphatase 197 (*)  Total Bilirubin 2.0 (*)    GFR calc non Af Amer 17 (*)    GFR calc Af Amer 20 (*)    All other components within normal limits  BRAIN NATRIURETIC PEPTIDE - Abnormal; Notable for the following:    B Natriuretic Peptide 253.0 (*)    All other components within normal limits  LACTIC ACID, PLASMA - Abnormal; Notable for the following:    Lactic Acid, Venous 5.2 (*)    All other components within normal limits  LACTIC ACID, PLASMA - Abnormal; Notable for the following:    Lactic Acid, Venous 2.9 (*)    All other components within normal limits  URINALYSIS COMPLETEWITH MICROSCOPIC ([facility] ONLY) - Abnormal; Notable for the following:    Color, Urine AMBER (*)    APPearance CLEAR (*)    Hgb urine dipstick 3+ (*)    Bacteria, UA RARE (*)    Squamous Epithelial / LPF 0-5 (*)    All other components within normal limits  CULTURE, BLOOD (ROUTINE X 2)  CULTURE, BLOOD (ROUTINE X 2)  TROPONIN I  LACTIC ACID, PLASMA   ____________________________________________  EKG  ED ECG REPORT I, Rebecka Apley, the attending physician, personally viewed and interpreted this ECG.   Date: 02/17/2015  EKG Time: 215  Rate: 84  Rhythm: atrial fibrillation, rate 84  Axis: normal  Intervals:right bundle branch block  ST&T Change: Flipped T waves in leads II, III, and F aVF  ____________________________________________  RADIOLOGY  CXR: Right infrahilar opacity could reflect atelectasis, pneumonia or aspiration CT head: No acute intracranial process identified, advanced age-related cerebral atrophy with chronic microvascular ischemic disease ____________________________________________   PROCEDURES  Procedure(s) performed: None  Critical Care performed: Yes, see critical care note(s)  CRITICAL CARE Performed by: Lucrezia Europe P   Total critical care time: 75  minutes  Critical care time was exclusive of separately billable procedures and treating other patients.  Critical care was necessary to treat or prevent imminent or life-threatening deterioration.  Critical care was time spent personally by me on the following activities: development of treatment plan with patient and/or surrogate as well as nursing, discussions with consultants, evaluation of patient's response to treatment, examination of patient, obtaining history from patient or surrogate, ordering and performing treatments and interventions, ordering and review of laboratory studies, ordering and review of radiographic studies, pulse oximetry and re-evaluation of patient's condition.  ____________________________________________   INITIAL IMPRESSION / ASSESSMENT AND PLAN / ED COURSE  Pertinent labs & imaging results that were available during my care of the patient were reviewed by me and considered in my medical decision making (see chart for details).  This is a 81 year old male who comes in today found unresponsive at his nursing home. The patient's mouth is severely dry. I ordered some blood work and some fluids on the patient. The patient's blood pressure did drop into the 60s so a second liter was started on the patient. I will check the patient's urine as well as the blood work and x-ray and reassess the patient once I received the results.  The patient received 2 L of normal saline and his pressure did improve. Once I did receive the results of the chest x-ray I ordered Levophed Roxicet, Zosyn and vancomycin as the patient was hospitalized less than one month ago. The patient did have a repeat of his lactic acid which was 2.9 showing that he did have some improvement with the fluids. The patient also appears to have some  acute renal injury likely from dehydration. The patient did have another decrease in his blood pressure to systolics 77 so I ordered a third liter of normal saline  for the patient to receive and admitted the patient to the hospitalist service. At this time the patient is not having any difficulty or complaints he will be admitted to the hospital for his symptoms. ____________________________________________   FINAL CLINICAL IMPRESSION(S) / ED DIAGNOSES  Final diagnoses:  Healthcare-associated pneumonia  Acute renal failure, unspecified acute renal failure type  Dehydration   hypotension Sepsis    Rebecka ApleyAllison P Daisha Filosa, MD 02/17/15 [phone]

## 2015-02-17 NOTE — Progress Notes (Addendum)
ANTIBIOTIC CONSULT NOTE - INITIAL  Pharmacy Consult for Vancomycin and Zosyn  Indication: rule out pneumonia  Allergies  Allergen Reactions  . Oxycodone Other (See Comments)    "makes me loopy"  . Neomycin-Bacitracin Zn-Polymyx Rash    Patient Measurements: Height: 6\' 2"  (188 cm) Weight: 168 lb (76.204 kg) IBW/kg (Calculated) : 82.2 Adjusted Body Weight:   Vital Signs: Temp: 97.7 F (36.5 C) (07/07 0829) Temp Source: Axillary (07/07 0829) BP: 147/98 mmHg (07/07 0829) Pulse Rate: 35 (07/07 0829) Intake/Output from previous day:   Intake/Output from this shift:    Labs:  Recent Labs  02/17/15 0214  WBC 8.9  HGB 15.7  PLT 224  CREATININE 3.20*   Estimated Creatinine Clearance: 20.5 mL/min (by C-G formula based on Cr of 3.2). No results for input(s): VANCOTROUGH, VANCOPEAK, VANCORANDOM, GENTTROUGH, GENTPEAK, GENTRANDOM, TOBRATROUGH, TOBRAPEAK, TOBRARND, AMIKACINPEAK, AMIKACINTROU, AMIKACIN in the last 72 hours.   Microbiology: Recent Results (from the past 720 hour(s))  Culture, blood (routine x 2)     Status: None   Collection Time: 01/27/15  3:03 PM  Result Value Ref Range Status   Specimen Description BLOOD  Final   Special Requests Normal  Final   Culture NO GROWTH 5 DAYS  Final   Report Status 02/01/2015 FINAL  Final  Culture, blood (routine x 2)     Status: None   Collection Time: 01/27/15  3:03 PM  Result Value Ref Range Status   Specimen Description BLOOD  Final   Special Requests Normal  Final   Culture NO GROWTH 5 DAYS  Final   Report Status 02/01/2015 FINAL  Final  Wound culture     Status: None   Collection Time: 01/28/15  1:05 PM  Result Value Ref Range Status   Specimen Description LEG  Final   Special Requests Normal  Final   Gram Stain NO WBC SEEN RARE GRAM NEGATIVE RODS   Final   Culture   Final    MODERATE GROWTH PSEUDOMONAS AERUGINOSA LIGHT GROWTH ENTEROCOCCUS FAECALIS LIGHT GROWTH PROTEUS MIRABILIS    Report Status 02/02/2015  FINAL  Final   Organism ID, Bacteria PSEUDOMONAS AERUGINOSA  Final   Organism ID, Bacteria ENTEROCOCCUS FAECALIS  Final   Organism ID, Bacteria PROTEUS MIRABILIS  Final      Susceptibility   Proteus mirabilis - MIC*    AMPICILLIN >=32 RESISTANT Resistant     CEFAZOLIN 8 SENSITIVE Sensitive     CEFTRIAXONE <=1 SENSITIVE Sensitive     CIPROFLOXACIN <=0.25 SENSITIVE Sensitive     GENTAMICIN >=16 RESISTANT Resistant     IMIPENEM 2 SENSITIVE Sensitive     TRIMETH/SULFA <=20 SENSITIVE Sensitive     CEFOXITIN <=4 SENSITIVE Sensitive     PIP/TAZO Value in next row Sensitive      SENSITIVE<=4    CEFTAZIDIME Value in next row Sensitive      SENSITIVE<=1    * LIGHT GROWTH PROTEUS MIRABILIS   Pseudomonas aeruginosa - MIC*    CEFTAZIDIME Value in next row Sensitive      SENSITIVE<=1    CIPROFLOXACIN Value in next row Sensitive      SENSITIVE<=1    GENTAMICIN Value in next row Sensitive      SENSITIVE<=1    IMIPENEM Value in next row Sensitive      SENSITIVE<=1    PIP/TAZO Value in next row Sensitive      SENSITIVE8    * MODERATE GROWTH PSEUDOMONAS AERUGINOSA   Enterococcus faecalis - MIC*  AMPICILLIN Value in next row Sensitive      SENSITIVE<=2    LINEZOLID Value in next row Sensitive      SENSITIVE2    * LIGHT GROWTH ENTEROCOCCUS FAECALIS    Medical History: Past Medical History  Diagnosis Date  . Chronic systolic congestive heart failure   . BPH (benign prostatic hyperplasia)   . Atrial fibrillation   . Essential hypertension   . Gastroesophageal reflux disease     Medications:  Scheduled:  . amiodarone  200 mg Oral Daily  . heparin  5,000 Units Subcutaneous 3 times per day  . [START ON 02/18/2015] pantoprazole  40 mg Oral QAC breakfast  . piperacillin-tazobactam  3.375 g Intravenous 3 times per day  . polyethylene glycol  17 g Oral Daily  . tamsulosin  0.4 mg Oral Daily  . vancomycin      . vancomycin  1,000 mg Intravenous Q36H   Assessment: Patient being treated  empirically for pneumonia.  PK parameters: Kel (hr-1): 0.021 Half-life (hrs): 33.01 Vd (liters): 53.34 (factor used: 0.7 L/kg)   Goal of Therapy:  Vancomycin trough level 15-20 mcg/ml. Plan:   Patient received Vancomycin 1 g IV x 1 @ 08:00. Will order Vancomycin 1 g IV q36 hours to start ~ 2000. Serum creatinine ordered with am labs. Will order Vancomycin level prior tot eh 08:00 dose on 7/12.   Will continue Zosyn 3.375 g IV q8 hours.   Nemiah Kissner D 02/17/2015,9:15 AM

## 2015-02-17 NOTE — ED Notes (Signed)
Reported critical lab value to MD.

## 2015-02-17 NOTE — Consult Note (Signed)
WOC wound consult note Reason for Consult: Sacral pressure ulcer, Stage III present on admission.  Hx PVD, has UNNAS boots on upon arrival to unit.  Unnas boots are removed to reveal full thickness skin loss to anterior and posterior shins with eschar in place. Eschar present to bilateral metatarsal heads and lateral feet.  Toes with necrosis on the tips and ulcerations between each toe.   Wound type: Stage III pressure ulcer to sacrum, POA PVD, per chart.  Unnas boots compression in place upon arrival with full thickness tissue loss to bilateral lower extremities and feet.  Pressure Ulcer POA: Yes Measurement: Sacrum 4 cm x 2 cm x 0.5 cm 100% thin yellow slough to wound bed. Will begin enzymatic debridement. Bilateral legs with full thickness tissue loss to posterior and anterior calf, lateral feet and first metatarsal head.  1.5 cm x 1.5 cm ulcer to left medial malleolus with visible tendon Legs are cleansed thoroughly with soap and water and large plaques of flaky dead skin are removed revealing healthier epithelium. Bilateral legs are moisturized with barrier cream Wound RUE:AVWUJWbed:Eschar with bleeding. Drainage (amount, consistency, odor) Moderate serosanguinous drainage.  Bleeding with dressing removal and leg cleansing. Musty odor.  Unclear how long compression has been in place. Periwound: Dry flaky skin, removed as much as possible with cleansing.  Dressing procedure/placement/frequency:Cleanse legs with soap and water from knees down, including between toes. Barrier cream to entire leg.  Apply nonadherent dressing (Mepitel silicone contact layer) to eschar and nonintact ulcerations.  Cover with ABD pad.  Wrap with kerlix and secure with tape.  Change daily and PRN bleeding/drainage strike through.    Cleanse sacral wound with NS and pat gently dry.  Apply Santyl ointment to wound bed.  Cover with NS moist 2x2.  Cover with ABD pad and tape.  Change daily.   Will not follow at this time.  Please  re-consult if needed.  Maple HudsonKaren Jiovany Scheffel RN BSN CWON Pager [phone]

## 2015-02-17 NOTE — Consult Note (Signed)
[facility]  CARDIOLOGY CONSULT NOTE  Patient ID: Ronald Good MRN: [id number] DOB/AGE: 03-08-1934 81 y.o.  Admit date: 02/17/2015 Referring Physician Dr. Imogene Burnhen Primary Physician  Dr. Burnadette PopLinthavong Primary Cardiologist  Dr. Darrold JunkerParaschos Reason for Consultation  Acute renal failure on Pradaxa  HPI:  Patient is a 81 year old male with history of paroxysmally atrial fibrillation, nonischemic cardiomyopathy with an ejection fraction of 30-35% who was admitted with acute renal failure and dehydration.  He had a functional study in May of this year revealing ejection fraction of 52% with a mid lateral scar and no ischemia.  Patient has a serum creatinine of 3.2 and a GFR of 17. His chads 2 Vasc score he is 4.  He is on amiodarone and has been on Pradaxa for anticoagulation.  This is been discontinued appropriately secondary to renal insufficiency.  Patient is a difficult historian.  Presumptive diagnosis on admission was dehydration.  ROS Review of Systems - History obtained from chart review and unobtainable from patient due to mental status General ROS: positive for  - fatigue Respiratory ROS: positive for - shortness of breath Cardiovascular ROS: no chest pain or dyspnea on exertion Gastrointestinal ROS: no abdominal pain, change in bowel habits, or black or bloody stools Neurological ROS: no TIA or stroke symptoms   Past Medical History  Diagnosis Date  . Chronic systolic congestive heart failure   . BPH (benign prostatic hyperplasia)   . Atrial fibrillation   . Essential hypertension   . Gastroesophageal reflux disease     History reviewed. No pertinent family history.  History   Social History  . Marital Status: Married    Spouse Name: N/A  . Number of Children: N/A  . Years of Education: N/A   Occupational History  . Not on file.   Social History Main Topics  . Smoking status: Never Smoker   . Smokeless tobacco: Not on file  . Alcohol  Use: No  . Drug Use: No  . Sexual Activity: Not on file   Other Topics Concern  . Not on file   Social History Narrative    Past Surgical History  Procedure Laterality Date  . Replacement total knee bilateral Bilateral      Prescriptions prior to admission  Medication Sig Dispense Refill Last Dose  . amiodarone (PACERONE) 200 MG tablet Take 200 mg by mouth daily.   02/16/2015 at 0800  . dabigatran (PRADAXA) 150 MG CAPS capsule Take 150 mg by mouth 2 (two) times daily.   02/16/2015 at 1600  . furosemide (LASIX) 20 MG tablet Take 20 mg by mouth 2 (two) times daily.   02/16/2015 at 1600  . metolazone (ZAROXOLYN) 5 MG tablet Take 5 mg by mouth daily. Take 30 minutes before AM lasix   02/16/2015 at 0630  . metoprolol succinate (TOPROL-XL) 50 MG 24 hr tablet Take 25 mg by mouth daily.   02/16/2015 at 1700  . oxyCODONE-acetaminophen (PERCOCET/ROXICET) 5-325 MG per tablet Take 2 tablets by mouth every 4 (four) hours as needed for severe pain (For pain with dressing change). Give 2 tablets by mount 40 minutes before dressing change.   02/14/2015 at 0950  . pantoprazole (PROTONIX) 20 MG tablet Take 20 mg by mouth daily.   02/16/2015 at 0600  . polyethylene glycol (MIRALAX / GLYCOLAX) packet Take 17 g by mouth daily. 14 each 0 02/16/2015 at 1700  . potassium chloride SA (K-DUR,KLOR-CON) 20 MEQ tablet Take 20 mEq by mouth daily.  02/16/2015 at 0900  . tamsulosin (FLOMAX) 0.4 MG CAPS capsule Take 0.4 mg by mouth daily.   02/16/2015 at 0800  . traMADol (ULTRAM) 50 MG tablet Take 2 tablets (100 mg total) by mouth every 6 (six) hours as needed for moderate pain. 30 tablet 1 02/16/2015 at 1641  . albuterol (VENTOLIN HFA) 108 (90 BASE) MCG/ACT inhaler Inhale 108 mcg into the lungs every 4 (four) hours as needed. 2 inhalations into lungs every four hours as needed for wheezing   PRN at PRN  . traMADol (ULTRAM) 50 MG tablet Take 1 tablet (50 mg total) by mouth every 6 (six) hours as needed for moderate pain or severe pain. As  needed for pain 30 tablet 0 02/15/2015 at 0531    Physical Exam: Blood pressure 147/98, pulse 35, temperature 97.7 F (36.5 C), temperature source Axillary, resp. rate 16, height  (1.88 m), weight 76.204 kg (168 lb), SpO2 92 %.   General appearance: combative and uncooperative Head: Normocephalic, without obvious abnormality, atraumatic Resp: normal percussion bilaterally Cardio: irregularly irregular rhythm GI: soft, non-tender; bowel sounds normal; no masses,  no organomegaly Extremities: extremities normal, atraumatic, no cyanosis or edema Pulses: 2+ and symmetric Neurologic: Mental status: alertness: Confused and disoriented, orientation: Disoriented Labs:   Lab Results  Component Value Date   WBC 8.9 02/17/2015   HGB 15.7 02/17/2015   HCT 48.2 02/17/2015   MCV 101.0* 02/17/2015   PLT 224 02/17/2015    Recent Labs Lab 02/17/15 0214  NA 149*  K 4.7  CL 107  CO2 29  BUN 91*  CREATININE 3.20*  CALCIUM 8.7*  PROT 6.7  BILITOT 2.0*  ALKPHOS 197*  ALT 51  AST 131*  GLUCOSE 221*   Lab Results  Component Value Date   TROPONINI 0.03 02/17/2015      Radiology:    Right infrahilar opacity suggesting atelectasis pneumonia are aspiration EKG:  Atrial fibrillation with controlled ventricular response  ASSESSMENT AND PLAN:   Patient with history of nonischemic cardiomyopathy EF 30% chronic atrial fibrillation anticoagulated with  Pradaxa.  Now is admitted with acute renal insufficiency.  Will need to come off of Pradaxa during this period of time.  His renal function does not improve will need to place him on warfarin for anticoagulation.  Would continue with amiodarone.  Gentle hydration to improve renal function. Signed: Dalia Heading MD, [facility] 02/17/2015, 2:19 PM

## 2015-02-17 NOTE — Care Management Note (Signed)
Case Management Note  Patient Details  Name: Ronald Good MRN: [id number] Date of Birth: September 13, 1933  Subjective/Objective:  [facility]  [facility] due to bilateral lower leg celulitis. Admitted now with PNA. Will follow and assist with discharge planning as needed.                  Action/Plan:   Expected Discharge Date:                  Expected Discharge Plan:  Skilled Nursing Facility  In-House Referral:  Clinical Social Work  Discharge planning Services     Post Acute Care Choice:    Choice offered to:     DME Arranged:    DME Agency:     HH Arranged:    HH Agency:     Status of Service:  In process, will continue to follow  Medicare Important Message Given:    Date Medicare IM Given:    Medicare IM give by:    Date Additional Medicare IM Given:    Additional Medicare Important Message give by:     If discussed at Long Length of Stay Meetings, dates discussed:    Additional Comments:  [name], RN 02/17/2015, 9:48 AM

## 2015-02-18 DIAGNOSIS — L8990 Pressure ulcer of unspecified site, unspecified stage: Secondary | ICD-10-CM | POA: Insufficient documentation

## 2015-02-18 LAB — C DIFFICILE QUICK SCREEN W PCR REFLEX
C DIFFICILE (CDIFF) INTERP: NEGATIVE
C Diff antigen: NEGATIVE
C Diff toxin: NEGATIVE

## 2015-02-18 LAB — BASIC METABOLIC PANEL
ANION GAP: 9 (ref 5–15)
BUN: 70 mg/dL — ABNORMAL HIGH (ref 6–20)
CO2: 28 mmol/L (ref 22–32)
Calcium: 8.5 mg/dL — ABNORMAL LOW (ref 8.9–10.3)
Chloride: 117 mmol/L — ABNORMAL HIGH (ref 101–111)
Creatinine, Ser: 1.71 mg/dL — ABNORMAL HIGH (ref 0.61–1.24)
GFR calc non Af Amer: 37 mL/min — ABNORMAL LOW (ref >60)
GFR, EST AFRICAN AMERICAN: 42 mL/min — AB (ref >60)
Glucose, Bld: 96 mg/dL (ref 65–99)
POTASSIUM: 3.1 mmol/L — AB (ref 3.5–5.1)
Sodium: 154 mmol/L — ABNORMAL HIGH (ref 135–145)

## 2015-02-18 LAB — LACTIC ACID, PLASMA: Lactic Acid, Venous: 1.5 mmol/L (ref 0.5–2.0)

## 2015-02-18 LAB — CBC
HCT: 43.1 % (ref 40.0–52.0)
Hemoglobin: 14.1 g/dL (ref 13.0–18.0)
MCH: 33.4 pg (ref 26.0–34.0)
MCHC: 32.8 g/dL (ref 32.0–36.0)
MCV: 101.9 fL — ABNORMAL HIGH (ref 80.0–100.0)
PLATELETS: 200 10*3/uL (ref 150–440)
RBC: 4.23 MIL/uL — ABNORMAL LOW (ref 4.40–5.90)
RDW: 17.2 % — ABNORMAL HIGH (ref 11.5–14.5)
WBC: 7.4 10*3/uL (ref 3.8–10.6)

## 2015-02-18 LAB — MAGNESIUM: Magnesium: 2.1 mg/dL (ref 1.7–2.4)

## 2015-02-18 MED ORDER — POTASSIUM CHLORIDE 10 MEQ/100ML IV SOLN
10.0000 meq | INTRAVENOUS | Status: AC
  Administered 2015-02-18 (×4): 10 meq via INTRAVENOUS
  Filled 2015-02-18 (×4): qty 100

## 2015-02-18 MED ORDER — VANCOMYCIN HCL IN DEXTROSE 750-5 MG/150ML-% IV SOLN
750.0000 mg | INTRAVENOUS | Status: DC
  Administered 2015-02-18 – 2015-02-19 (×2): 750 mg via INTRAVENOUS
  Filled 2015-02-18 (×3): qty 150

## 2015-02-18 MED ORDER — POTASSIUM CHLORIDE CRYS ER 20 MEQ PO TBCR
40.0000 meq | EXTENDED_RELEASE_TABLET | Freq: Once | ORAL | Status: DC
  Filled 2015-02-18: qty 2

## 2015-02-18 MED ORDER — DEXTROSE 5 % IV SOLN
INTRAVENOUS | Status: DC
  Administered 2015-02-18 – 2015-02-20 (×4): via INTRAVENOUS

## 2015-02-18 MED ORDER — DIAZEPAM 5 MG/ML IJ SOLN
5.0000 mg | Freq: Once | INTRAMUSCULAR | Status: AC
  Administered 2015-02-18: 5 mg via INTRAVENOUS
  Filled 2015-02-18: qty 2

## 2015-02-18 NOTE — Progress Notes (Signed)
Initial Nutrition Assessment  DOCUMENTATION CODES:  Severe malnutrition in context of acute illness/injury  INTERVENTION:   Medical Food Supplement Therapy: recommend trial of Magic Cup TID with meals. Pt had been taking Ensure as outpatient; at present, recommend MightyShakes in place TID with meals Meals and Snacks: Cater to patient preferences Coordination of Care: discussed sodium trend, current IVF with MD Kasa during ICU rounds; received verbal order from MD kasa to change IVF to D5 at 75 ml/hr   NUTRITION DIAGNOSIS:  Inadequate oral intake related to acute illness as evidenced by per patient/family report.   GOAL:  Patient will meet greater than or equal to 90% of their needs   MONITOR:  PO intake (Energy Intake, Anthropometrics, Digestive System, Electrolyte/Renal Profile)  REASON FOR ASSESSMENT:  Malnutrition Screening Tool    ASSESSMENT:  Pt admitted with septicemia, AMS, pneumonia; pt alert but confused, family at bedside  PMHx:  Past Medical History  Diagnosis Date  . Chronic systolic congestive heart failure   . BPH (benign prostatic hyperplasia)   . Atrial fibrillation   . Essential hypertension   . Gastroesophageal reflux disease     Diet Order: diet advanced to Dysphagia I, Nectar Thick s/p SLP evaluation this AM   Current Nutrition:  No intake as of yet, pt NPO on visit this AM, diet now advanced  Food/Nutrition-Related History: Family reports that since discharge from hospital 3 weeks ago, pt has eaten very little. They have been able to get him to drink a little Ensure but report he does not really like it. Eaten some mashed potatoes, not very much else.   Medications: NS at 75 ml/hr  Electrolyte/Renal Profile and Glucose Profile:   Recent Labs Lab 02/17/15 0214 02/18/15 0638  NA 149* 154*  K 4.7 3.1*  CL 107 117*  CO2 29 28  BUN 91* 70*  CREATININE 3.20* 1.71*  CALCIUM 8.7* 8.5*  MG  --  2.1  GLUCOSE 221* 96   Protein Profile:    Recent Labs Lab 02/17/15 0214  ALBUMIN 2.2*    Gastrointestinal Profile: Last BM: +loost stool today   Nutrition-Focused Physical Exam Findings: Nutrition-Focused physical exam completed. Findings are mild/moderate fat depletion, mild/moderate muscle depletion (unable to access LE, wrapped in gauze with wounds)  Weight Change: Family reports wt loss; per documented weights; 5% wt loss in <3 weeks  Anthropometrics:  Height:  Ht Readings from Last 1 Encounters:  02/17/15 6\' 2"  (1.88 m)    Weight:  Wt Readings from Last 1 Encounters:  02/17/15 168 lb (76.204 kg)    Ideal Body Weight:     Wt Readings from Last 10 Encounters:  02/17/15 168 lb (76.204 kg)  02/03/15 177 lb 1.6 oz (80.332 kg)    BMI:  Body mass index is 21.56 kg/(m^2).  Estimated Nutritional Needs:  Kcal:  [id number] kcals (BEE 1546, 1.2 AF, 1.1-1.3 IF) using current wt of 76 kg  Protein:  91-114 g (1.2-1.5 g/kg)   Fluid:  2280-2660 mL (30-35 ml/kg)   Diet Order:  DIET - DYS 1 Room service appropriate?: Yes; Fluid consistency:: Nectar Thick    Intake/Output Summary (Last 24 hours) at 02/18/15 1239 Last data filed at 02/18/15 0900  Gross per 24 hour  Intake 984.12 ml  Output   1950 ml  Net -965.88 ml    HIGH Care Level  Romelle Starcherate Nigil Braman MS, RD, LDN [phone] Pager

## 2015-02-18 NOTE — Progress Notes (Signed)
Pt lying in bed alert but has intermittent confusion. Pt has anxiety and was given valium to calm which was effective with first dose  but no longer effective. . Pt received second dose. With no results physician paged. Pt cont on levophed at . Manual BP taken 98/52 and systolic  Pressure doppler to be accurate.

## 2015-02-18 NOTE — Progress Notes (Signed)
   02/18/15 1208  Clinical Encounter Type  Visited With Patient;Family  Visit Type Initial  Consult/Referral To Chaplain  Spiritual Encounters  Spiritual Needs Emotional;Other (Comment)  Stress Factors  Patient Stress Factors Health changes  Chaplain offered a compassionate presence and support to family and patient. Assisted with the care of patient and supported staff. Chaplain Ailany Koren A. Ayah Cozzolino Ext. [phone]

## 2015-02-18 NOTE — Progress Notes (Addendum)
ANTIBIOTIC CONSULT NOTE - FOLLOW UP  Pharmacy Consult for Vancomycin, Zosyn, and Electrolytes  Indication: rule out pneumonia; electrolyte management   Allergies  Allergen Reactions  . Oxycodone Other (See Comments)    "makes me loopy"  . Neomycin-Bacitracin Zn-Polymyx Rash    Patient Measurements: Height:  (188 cm) Weight: 168 lb (76.204 kg) IBW/kg (Calculated) : 82.2 Adjusted Body Weight:   Vital Signs: Temp: 97.7 F (36.5 C) (07/08 0200) Temp Source: Axillary (07/08 0200) BP: 92/70 mmHg (07/08 0400) Pulse Rate: 96 (07/08 0400) Intake/Output from previous day: 07/07 0701 - 07/08 0700 In: 984.1 [I.V.:192.5; IV Piggyback:791.7] Out: 1950 [Urine:1950] Intake/Output from this shift:    Labs:  Recent Labs  02/17/15 0214 02/18/15 0638  WBC 8.9 7.4  HGB 15.7 14.1  PLT 224 200  CREATININE 3.20* 1.71*   Estimated Creatinine Clearance: 38.4 mL/min (by C-G formula based on Cr of 1.71). No results for input(s): VANCOTROUGH, VANCOPEAK, VANCORANDOM, GENTTROUGH, GENTPEAK, GENTRANDOM, TOBRATROUGH, TOBRAPEAK, TOBRARND, AMIKACINPEAK, AMIKACINTROU, AMIKACIN in the last 72 hours.   Microbiology: Recent Results (from the past 720 hour(s))  Culture, blood (routine x 2)     Status: None   Collection Time: 01/27/15  3:03 PM  Result Value Ref Range Status   Specimen Description BLOOD  Final   Special Requests Normal  Final   Culture NO GROWTH 5 DAYS  Final   Report Status 02/01/2015 FINAL  Final  Culture, blood (routine x 2)     Status: None   Collection Time: 01/27/15  3:03 PM  Result Value Ref Range Status   Specimen Description BLOOD  Final   Special Requests Normal  Final   Culture NO GROWTH 5 DAYS  Final   Report Status 02/01/2015 FINAL  Final  Wound culture     Status: None   Collection Time: 01/28/15  1:05 PM  Result Value Ref Range Status   Specimen Description LEG  Final   Special Requests Normal  Final   Gram Stain NO WBC SEEN RARE GRAM NEGATIVE RODS    Final   Culture   Final    MODERATE GROWTH PSEUDOMONAS AERUGINOSA LIGHT GROWTH ENTEROCOCCUS FAECALIS LIGHT GROWTH PROTEUS MIRABILIS    Report Status 02/02/2015 FINAL  Final   Organism ID, Bacteria PSEUDOMONAS AERUGINOSA  Final   Organism ID, Bacteria ENTEROCOCCUS FAECALIS  Final   Organism ID, Bacteria PROTEUS MIRABILIS  Final      Susceptibility   Proteus mirabilis - MIC*    AMPICILLIN >=32 RESISTANT Resistant     CEFAZOLIN 8 SENSITIVE Sensitive     CEFTRIAXONE <=1 SENSITIVE Sensitive     CIPROFLOXACIN <=0.25 SENSITIVE Sensitive     GENTAMICIN >=16 RESISTANT Resistant     IMIPENEM 2 SENSITIVE Sensitive     TRIMETH/SULFA <=20 SENSITIVE Sensitive     CEFOXITIN <=4 SENSITIVE Sensitive     PIP/TAZO Value in next row Sensitive      SENSITIVE<=4    CEFTAZIDIME Value in next row Sensitive      SENSITIVE<=1    * LIGHT GROWTH PROTEUS MIRABILIS   Pseudomonas aeruginosa - MIC*    CEFTAZIDIME Value in next row Sensitive      SENSITIVE<=1    CIPROFLOXACIN Value in next row Sensitive      SENSITIVE<=1    GENTAMICIN Value in next row Sensitive      SENSITIVE<=1    IMIPENEM Value in next row Sensitive      SENSITIVE<=1    PIP/TAZO Value in next row Sensitive  SENSITIVE8    * MODERATE GROWTH PSEUDOMONAS AERUGINOSA   Enterococcus faecalis - MIC*    AMPICILLIN Value in next row Sensitive      SENSITIVE<=2    LINEZOLID Value in next row Sensitive      SENSITIVE2    * LIGHT GROWTH ENTEROCOCCUS FAECALIS  Blood culture (routine x 2)     Status: None (Preliminary result)   Collection Time: 02/17/15  2:30 AM  Result Value Ref Range Status   Specimen Description BLOOD LEFT WRIST  Final   Special Requests BOTTLES DRAWN AEROBIC AND ANAEROBIC 7ML  Final   Culture  Setup Time   Final    GRAM NEGATIVE RODS ANAEROBIC BOTTLE ONLY CRITICAL RESULT CALLED TO, READ BACK BY AND VERIFIED WITH: MICHELLE WILLIAMS AT 2026 02/17/15 SDR CONFIRMED BY SFW    Culture   Final    PROTEUS  MIRABILIS ANAEROBIC BOTTLE ONLY SUSCEPTIBILITIES TO FOLLOW GRAM POSITIVE COCCI AEROBIC BOTTLE ONLY    Report Status PENDING  Incomplete  Blood culture (routine x 2)     Status: None (Preliminary result)   Collection Time: 02/17/15  2:53 AM  Result Value Ref Range Status   Specimen Description BLOOD LEFT HAND  Final   Special Requests BOTTLES DRAWN AEROBIC AND ANAEROBIC 7ML  Final   Culture  Setup Time   Final    GRAM NEGATIVE RODS AEROBIC BOTTLE ONLY CRITICAL RESULT CALLED TO, READ BACK BY AND VERIFIED WITH: MICHELLE WILLIAMS @0451  02/18/15 BY AJO CONFIRMED BY BGB    Culture NO GROWTH 1 DAY  Final   Report Status PENDING  Incomplete  MRSA PCR Screening     Status: Abnormal   Collection Time: 02/17/15  8:45 AM  Result Value Ref Range Status   MRSA by PCR POSITIVE (A) NEGATIVE Final    Comment:        The GeneXpert MRSA Assay (FDA approved for NASAL specimens only), is one component of a comprehensive MRSA colonization surveillance program. It is not intended to diagnose MRSA infection nor to guide or monitor treatment for MRSA infections. CRITICAL RESULT CALLED TO, READ BACK BY AND VERIFIED WITH: STACY COLLIE @ [phone] 02/17/15 BGB     Medical History: Past Medical History  Diagnosis Date  . Chronic systolic congestive heart failure   . BPH (benign prostatic hyperplasia)   . Atrial fibrillation   . Essential hypertension   . Gastroesophageal reflux disease     Medications:  Scheduled:  . amiodarone  200 mg Oral Daily  . Chlorhexidine Gluconate Cloth  6 each Topical Q0600  . collagenase   Topical Daily  . heparin  5,000 Units Subcutaneous 3 times per day  . mupirocin ointment  1 application Nasal BID  . pantoprazole  40 mg Oral QAC breakfast  . piperacillin-tazobactam  3.375 g Intravenous 3 times per day  . polyethylene glycol  17 g Oral Daily  . tamsulosin  0.4 mg Oral Daily  . vancomycin  750 mg Intravenous Q18H   Assessment: Patient being treated empirically  for pneumonia.  PK parameters: Kel (hr-1): 0.021 Half-life (hrs): 33.01 Vd (liters): 53.34 (factor used: 0.7 L/kg)  Updated Pk based on scr of 1.71 mg/dl Kel (hr-1): 9.6040.036 Half-life (hrs): 19.25 Vd (liters): 53.34 (factor used: 0.7 L/kg  All electrolytes within normal range except K= 3.1  Goal of Therapy:  Vancomycin trough level 15-20 mcg/ml. Plan:  Patient previously dosed Vancomycin 1 g IV q36 hours. Based on updated Pk. Will change to Vancomycin 750 mg IV  q18 hours. Will order trough level prior to the 2000 dose on 7/10.   Will continue Zosyn 3.375 g IV q8 hours.   Will give KCL 10 mEq x4 IV. Will order electrolyte panel with am labs.   Chandlor Noecker D 02/18/2015,8:27 AM

## 2015-02-18 NOTE — Progress Notes (Signed)
Patient afib on cardiac monitor, alert but confused to time and situation. D5 at 75 for hypernatremia. Levophed titrated off at 1800, but had to be restarted at 1900 (SBP 70) 3L n/c, SATs WNL. Dysphagia diet secondary to SLE, poor appetite. Foley catheter with good UOP. Family at bedside, updated on patient status and POC.

## 2015-02-18 NOTE — Evaluation (Addendum)
Clinical/Bedside Swallow Evaluation Patient Details  Name: Ronald Good MRN: [id number] Date of Birth: 1933/11/06  Today's Date: 02/18/2015 Time: SLP Start Time (ACUTE ONLY): 1145 SLP Stop Time (ACUTE ONLY): 1245 SLP Time Calculation (min) (ACUTE ONLY): 60 min  Past Medical History:  Past Medical History  Diagnosis Date  . Chronic systolic congestive heart failure   . BPH (benign prostatic hyperplasia)   . Atrial fibrillation   . Essential hypertension   . Gastroesophageal reflux disease    Past Surgical History:  Past Surgical History  Procedure Laterality Date  . Replacement total knee bilateral Bilateral    HPI:  Pt is a 81 y.o. male with a known history of CHF, Afib, HTN and recent leg cellulitis admitted to this facility last month. He was found to have hypoxia with SAT at 70% and low blood sugar at 40's at SNF. He is confused, only complaint of thirsty and weakness. BP dropped to 70/46 in ED. He is found to have ARF and dehydration. CXR: right pneumonia. Pt had decreased oral intake last admission when evaluated.    Assessment / Plan / Recommendation Clinical Impression  Pt appears to be able to adequately tolerate po trials of ice chips, Nectar consistency liquids, and puree w/ no immediate, overt s/s of aspiration noted during the po trials; no decline in O2 sats/respiratory status. No solids were assessed at this session sec. to his Cognitive status. Pt demo. min. oral phase deficits c/b prolonged oral phase/A-P transfer and poor awarenss for pulling liquids via straw. Of note, pt exhibited esophageal dysmotility during last admission; he would be at increased risk for aspiration of regurgitated material. It was rec. that he f/u w/ GI post last discharge. Rec. a modified diet of Dys. 1 w/ Nectar liquids w/ aspiration and Reflux precautions. Rec. ST f/u w/ toleration of diet and trials to upgrade diet while admitted.      Aspiration Risk  Mild    Diet Recommendation  Dysphagia 1 w/ Nectar liquids; monitoring w/ all po's. Feeding assistance at meals sec. to Cognitive status.  Medication Administration: Crushed with puree Compensations: Small sips/bites;Check for pocketing;Multiple dry swallows after each bite/sip;Follow solids with liquid    Other  Recommendations Recommended Consults:  (Dietician consult) Oral Care Recommendations: Oral care BID;Oral care before and after PO;Staff/trained caregiver to provide oral care Other Recommendations: Order thickener from pharmacy;Remove water pitcher;Prohibited food (jello, ice cream, thin soups)   Follow Up Recommendations       Frequency and Duration min 3x week  1 week   Pertinent Vitals/Pain denied    SLP Swallow Goals  see care plan   Swallow Study Prior Functional Status   pt has resided at [facility] for Rehab w/ increased Cognitive decline(confusion of others, time, events) as well as declined ADLs such as feeding self, per family report. Pt was taking an Ensure at the SNF per report.     General Date of Onset: 02/17/15 Other Pertinent Information: Pt is a 81 y.o. male with a known history of CHF, Afib, HTN and recent leg cellulitis admitted to this facility last month. He was found to have hypoxia with SAT at 70% and low blood sugar at 40's at SNF. He is confused, only complaint of thirsty and weakness. BP dropped to 70/46 in ED. He is found to have ARF and dehydration. CXR: right pneumonia. Pt had decreased oral intake last admission when evaluated.  Type of Study: Bedside swallow evaluation Previous Swallow Assessment: last admission; 01/2015 Diet Prior  to this Study: Dysphagia 3 (soft);Thin liquids Temperature Spikes Noted: No (wbc 7.4) Respiratory Status: Supplemental O2 delivered via (comment) (2 liters) History of Recent Intubation: No Behavior/Cognition: Alert;Cooperative;Confused;Distractible;Requires cueing Oral Cavity - Dentition: Poor condition;Missing dentition Self-Feeding Abilities: Total  assist Patient Positioning: Upright in bed Baseline Vocal Quality: Normal;Low vocal intensity Volitional Cough: Strong Volitional Swallow: Able to elicit    Oral/Motor/Sensory Function Overall Oral Motor/Sensory Function:  (unable to assess sec. to pt's Cognitive status) Labial ROM: Within Functional Limits (w/ trials) Labial Strength: Within Functional Limits (w/ trials) Lingual Strength: Within Functional Limits (w/ trials) Facial Symmetry: Within Functional Limits   Ice Chips Ice chips: Within functional limits Presentation: Spoon (fed by SLP; 6 trials)   Thin Liquid Thin Liquid: Not tested    Nectar Thick Nectar Thick Liquid: Impaired Presentation: Spoon;Straw Oral Phase Impairments: Poor awareness of bolus (pulling bolus from straw) Oral phase functional implications: Prolonged oral transit Pharyngeal Phase Impairments:  (none)   Honey Thick Honey Thick Liquid: Not tested   Puree Puree: Impaired Presentation: Spoon (fed by SLP; ~3-4 ozs) Oral Phase Impairments: Poor awareness of bolus Oral Phase Functional Implications: Prolonged oral transit Pharyngeal Phase Impairments:  (none)   Solid   GO    Solid: Not tested       Sahiti Joswick 02/18/2015,2:54 PM

## 2015-02-18 NOTE — Clinical Social Work Note (Addendum)
Clinical Social Work Assessment  Patient Details  Name: Ronald Good MRN: [id number] Date of Birth: 1933-10-09  Date of referral:  02/18/15               Reason for consult:  Facility Placement (pt is from [facility] but would like to explore other faclity options.  )                Permission sought to share information with:  Facility Medical sales representativeContact Representative, Family Supports Permission granted to share information::   (pt was not oriented enough to speak with CSW.)  Name::        Agency::     Relationship::     Contact Information:     Housing/Transportation Living arrangements for the past 2 months:  Single Family Home, Skilled Nursing Facility Source of Information:  Medical Team, Spouse, Adult Children Patient Interpreter Needed:  None Criminal Activity/Legal Involvement Pertinent to Current Situation/Hospitalization:  Yes Significant Relationships:  Adult Children, Spouse Raynelle Fanning(Julie [phone]- [phone]) Lives with:  Spouse Do you feel safe going back to the place where you live?    Need for family participation in patient care:  Yes (Comment)  Care giving concerns:  Pt's wife expressed that she was intersted in going to a different SNF facility that was closer to her home.  CSW stated that she would reach out to other facilities in the [place] area once pt was closer to [place].     Social Worker assessment / plan:  CSW attempted to speak to pt, but he was not oriented enough to speak to CSW.  CSW spoke to pt's wife and daughter (numbers listed in emergency contacts on face sheet).  Both were in agreement with SNF placement once pt was medically stable.  However they do not want to return to [facility] at this time.  The location is too far for her.  CSW advised that a bed search would be sent out to [facility] once pt was closer to [place].  Per family pt was beginning to also become more confused prior to the original hospital visit.  Per  family he would start to forget the year and confuse time and people from his past.  CSW statrted FL2 and on pt's chart.    Employment status:  Disabled (Comment on whether or not currently receiving Disability), Retired Health and safety inspectornsurance information:  Medicare PT Recommendations:    Information / Referral to community resources:     Patient/Family's Response to care:  Pt's family was in agreement with DC to SNF once pt is medically stable.  Patient/Family's Understanding of and Emotional Response to Diagnosis, Current Treatment, and Prognosis:  Pt's family verbalized their understanding of SNF placement and were in agreement.    Emotional Assessment Appearance:  Appears older than stated age Attitude/Demeanor/Rapport:  Unable to Assess Affect (typically observed):  Unable to Assess Orientation:    Alcohol / Substance use:  Never Used Psych involvement (Current and /or in the community):  No (Comment)  Discharge Needs  Concerns to be addressed:  Care Coordination Readmission within the last 30 days:  Yes Current discharge risk:  Physical Impairment, Other (not currently medically stable for DC.) Barriers to Discharge:      Chauncy PassyBennerson, Almendra Loria J, LCSW 02/18/2015, 3:16 PM

## 2015-02-18 NOTE — Progress Notes (Addendum)
[facility] [place] [facility]   PATIENT NAME: Ronald CrookSamuel Good    MR#:  [id number]  DATE OF BIRTH:  August 03, 1934  SUBJECTIVE:  CHIEF COMPLAINT:   Chief Complaint  Patient presents with  . Altered Mental Status  No complaint, but the patient is confused.  REVIEW OF SYSTEMS:  CONSTITUTIONAL: No fever, fatigue or weakness.  EYES: No blurred or double vision.  EARS, NOSE, AND THROAT: No tinnitus or ear pain.  RESPIRATORY: No cough, shortness of breath, wheezing or hemoptysis.  CARDIOVASCULAR: No chest pain, orthopnea, edema.  GASTROINTESTINAL: No nausea, vomiting, diarrhea or abdominal pain.  GENITOURINARY: No dysuria, hematuria.  ENDOCRINE: No polyuria, nocturia,  HEMATOLOGY: No anemia, easy bruising or bleeding SKIN: No rash or lesion. MUSCULOSKELETAL: No joint pain or arthritis.   NEUROLOGIC: No tingling, numbness, weakness.  PSYCHIATRY: No anxiety or depression.   DRUG ALLERGIES:   Allergies  Allergen Reactions  . Oxycodone Other (See Comments)    "makes me loopy"  . Neomycin-Bacitracin Zn-Polymyx Rash    VITALS:  Blood pressure 92/70, pulse 96, temperature 97.7 F (36.5 C), temperature source Axillary, resp. rate 27, height 6\' 2"  (1.88 m), weight 76.204 kg (168 lb), SpO2 100 %.  PHYSICAL EXAMINATION:  GENERAL:  81 y.o.-year-old patient lying in the bed with critical ill looking. EYES: Pupils equal, round, reactive to light and accommodation. No scleral icterus. Extraocular muscles intact.  HEENT: Head atraumatic, normocephalic. Oropharynx and nasopharynx clear. Very dry oral mucosa. NECK:  Supple, no jugular venous distention. No thyroid enlargement, no tenderness.  LUNGS: Normal breath sounds bilaterally, no wheezing, rales,rhonchi or crepitation. No use of accessory muscles of respiration.  CARDIOVASCULAR: S1, S2 normal. No murmurs, rubs, or gallops.  ABDOMEN: Soft, nontender, nondistended. Bowel sounds present. No organomegaly or mass.   EXTREMITIES: No pedal edema, cyanosis, or clubbing. B/L leg in dressing. Sacral DU stage 2.  NEUROLOGIC: Cranial nerves II through XII are intact. Muscle strength 3/5 in all extremities. Sensation intact. Gait not checked.  PSYCHIATRIC: The patient is awake but confused.  SKIN: Sacral DU stage 2. Leg wound in dressing. Poor turgor.   LABORATORY PANEL:   CBC  Recent Labs Lab 02/18/15 0638  WBC 7.4  HGB 14.1  HCT 43.1  PLT 200   ------------------------------------------------------------------------------------------------------------------  Chemistries   Recent Labs Lab 02/17/15 0214  NA 149*  K 4.7  CL 107  CO2 29  GLUCOSE 221*  BUN 91*  CREATININE 3.20*  CALCIUM 8.7*  AST 131*  ALT 51  ALKPHOS 197*  BILITOT 2.0*   ------------------------------------------------------------------------------------------------------------------  Cardiac Enzymes  Recent Labs Lab 02/17/15 0214  TROPONINI 0.03   ------------------------------------------------------------------------------------------------------------------  RADIOLOGY:  Ct Head Wo Contrast  02/17/2015   CLINICAL DATA:  Initial evaluation for acute altered mental status, unresponsive.  EXAM: CT HEAD WITHOUT CONTRAST  TECHNIQUE: Contiguous axial images were obtained from the base of the skull through the vertex without intravenous contrast.  COMPARISON:  Prior CT from 12/15/2013  FINDINGS: Study is degraded by motion artifact.  Diffuse prominence of the CSF containing spaces is compatible with generalized cerebral atrophy. Patchy and confluent hypodensity within the periventricular and deep white matter both cerebral hemispheres most consistent with chronic small vessel ischemic disease. Prominent vascular calcifications within the carotid siphons.  No acute large vessel territory infarct identified. No acute intracranial hemorrhage. No mass lesion, midline shift, or mass effect. Ventricular prominence related to  global parenchymal volume loss present without hydrocephalus. No extra-axial fluid collection.  Scalp soft tissues  within normal limits. No acute abnormality about the orbits.  Calvarium intact.  Paranasal sinuses and mastoid air cells are well pneumatized and free of fluid. Middle ear cavities are clear.  IMPRESSION: 1. No acute intracranial process identified. 2. Advanced age-related cerebral atrophy with chronic microvascular ischemic disease.   Electronically Signed   By: Rise Mu M.D.   On: 02/17/2015 06:51   US Renal  02/17/2015   CLINICAL DATA:  Acute renal failure  EXAM: RENAL ULTRASOUND  COMPARISON:  CT abdomen and pelvis July 27, 2014  FINDINGS: Right Kidney:  Length: 9.5 cm. Echogenicity within normal limits. Renal cortical thickness is low normal. No mass, perinephric fluid, or hydronephrosis visualized. No sonographically demonstrable calculus or ureterectasis.  Left Kidney:  Length: 11.1 cm. Echogenicity and renal cortical thickness are within normal limits. No perinephric fluid or hydronephrosis visualized. There is a cyst arising from the lower pole of the left kidney measuring 3.8 x 3.7 x 3.2 cm, documented previously. No other renal mass. No sonographically demonstrable calculus or ureterectasis.  Bladder:  Urinary bladder is decompressed with a catheter and therefore cannot be assessed.  IMPRESSION: Simple cyst lower pole left kidney. Study otherwise within normal limits.   Electronically Signed   By: Bretta Bang III M.D.   On: 02/17/2015 16:02   Dg Chest Port 1 View  02/17/2015   CLINICAL DATA:  Central catheter placement  EXAM: PORTABLE CHEST - 1 VIEW  COMPARISON:  Study obtained earlier in the day  FINDINGS: Central catheter tip is in the superior vena cava. No pneumothorax. There is probable atelectatic change in medial right base. Lungs elsewhere clear. Heart is mildly enlarged with pulmonary vascularity within normal limits. No adenopathy. There is atherosclerotic  change in the aortic arch region.  IMPRESSION: Central catheter tip in superior vena cava. No demonstrable pneumothorax. Probable atelectasis medial right base. No new opacity. No change in cardiac silhouette.   Electronically Signed   By: Bretta Bang III M.D.   On: 02/17/2015 21:19   Dg Chest Portable 1 View  02/17/2015   CLINICAL DATA:  Hypoxia.  Found unresponsive and hypoglycemic.  EXAM: PORTABLE CHEST - 1 VIEW  COMPARISON:  07/02/2014  FINDINGS: Chronic cardiomegaly. Stable and negative aortic and hilar contours. There is a streaky right infrahilar lung opacity. No edema, effusion, or pneumothorax.  IMPRESSION: Right infrahilar opacity could reflect atelectasis, pneumonia, or aspiration.   Electronically Signed   By: Marnee Spring M.D.   On: 02/17/2015 03:32    EKG:   Orders placed or performed during the hospital encounter of 02/17/15  . EKG 12-Lead  . EKG 12-Lead    ASSESSMENT AND PLAN:   HAP and septicemia.  Continue vancomycin and zosyn, follow up CBC and cultures. B/C: GNR  Hypotension. Started levophed drip last night, Hold lasix, toprol and metolazone.  ARF. NS iv bolus then 75 ml/h. Follow up BMP. Hold lasix, toprol and metolazone. Kidney US: no obstruction.   Hypernatremia. Na 154. Follow up BMP.  Hypokalemia. KCl. F/u BMP.   Lactic acidosis. improved Sacral decubitus ulcer stage 2 and  leg wound. Wound care   AFib.Continue amiodarone and hold pradaxa due to ARF  Chronic systolic CHF. Stable.     All the records are reviewed and case discussed with Care Management/Social Workerr. Management plans discussed with the patient, family and they are in agreement.  CODE STATUS: FULL CODE (I discussed with patient's daughter yesterday. Her sister is POA. Full code for now.)  TOTAL CRITICAL  TIME TAKING CARE OF THIS PATIENT: 47 minutes.   POSSIBLE D/C IN 5 DAYS, DEPENDING ON CLINICAL CONDITION.   Shaune Pollack M.D on 02/18/2015 at 8:15 AM  Between 7am to 6pm -  Pager - [phone]  After 6pm go to [email or web address] - password [email or web address] [facility]  [facility]  Office  [phone]  CC: Primary care physician; Marisue Ivan, MD

## 2015-02-19 DIAGNOSIS — E43 Unspecified severe protein-calorie malnutrition: Secondary | ICD-10-CM | POA: Insufficient documentation

## 2015-02-19 LAB — CBC
HCT: 43.5 % (ref 40.0–52.0)
Hemoglobin: 14.4 g/dL (ref 13.0–18.0)
MCH: 33.3 pg (ref 26.0–34.0)
MCHC: 33.1 g/dL (ref 32.0–36.0)
MCV: 100.6 fL — ABNORMAL HIGH (ref 80.0–100.0)
Platelets: 189 10*3/uL (ref 150–440)
RBC: 4.32 MIL/uL — AB (ref 4.40–5.90)
RDW: 17.2 % — AB (ref 11.5–14.5)
WBC: 8.5 10*3/uL (ref 3.8–10.6)

## 2015-02-19 LAB — PHOSPHORUS
Phosphorus: 2.4 mg/dL — ABNORMAL LOW (ref 2.5–4.6)
Phosphorus: 4.2 mg/dL (ref 2.5–4.6)

## 2015-02-19 LAB — BASIC METABOLIC PANEL
ANION GAP: 7 (ref 5–15)
BUN: 46 mg/dL — ABNORMAL HIGH (ref 6–20)
CALCIUM: 8.4 mg/dL — AB (ref 8.9–10.3)
CO2: 29 mmol/L (ref 22–32)
Chloride: 117 mmol/L — ABNORMAL HIGH (ref 101–111)
Creatinine, Ser: 1.3 mg/dL — ABNORMAL HIGH (ref 0.61–1.24)
GFR, EST AFRICAN AMERICAN: 59 mL/min — AB (ref >60)
GFR, EST NON AFRICAN AMERICAN: 51 mL/min — AB (ref >60)
GLUCOSE: 129 mg/dL — AB (ref 65–99)
POTASSIUM: 2.9 mmol/L — AB (ref 3.5–5.1)
SODIUM: 153 mmol/L — AB (ref 135–145)

## 2015-02-19 LAB — POTASSIUM: Potassium: 2.9 mmol/L — CL (ref 3.5–5.1)

## 2015-02-19 LAB — MAGNESIUM: Magnesium: 1.9 mg/dL (ref 1.7–2.4)

## 2015-02-19 MED ORDER — VANCOMYCIN HCL IN DEXTROSE 750-5 MG/150ML-% IV SOLN
750.0000 mg | Freq: Two times a day (BID) | INTRAVENOUS | Status: DC
  Filled 2015-02-19 (×2): qty 150

## 2015-02-19 MED ORDER — POTASSIUM CHLORIDE 10 MEQ/100ML IV SOLN
10.0000 meq | INTRAVENOUS | Status: AC
  Administered 2015-02-19 (×5): 10 meq via INTRAVENOUS
  Filled 2015-02-19 (×6): qty 100

## 2015-02-19 MED ORDER — POTASSIUM PHOSPHATES 15 MMOLE/5ML IV SOLN
40.0000 meq | Freq: Once | INTRAVENOUS | Status: AC
  Administered 2015-02-19: 40 meq via INTRAVENOUS
  Filled 2015-02-19: qty 9.09

## 2015-02-19 MED ORDER — CIPROFLOXACIN IN D5W 200 MG/100ML IV SOLN
200.0000 mg | Freq: Two times a day (BID) | INTRAVENOUS | Status: DC
Start: 2015-02-19 — End: 2015-02-20
  Administered 2015-02-19 – 2015-02-20 (×2): 200 mg via INTRAVENOUS
  Filled 2015-02-19 (×5): qty 100

## 2015-02-19 NOTE — Progress Notes (Signed)
MEDICATION RELATED CONSULT NOTE - FOLLOW UP   Pharmacy Consult for Electrolyte Management   Allergies  Allergen Reactions  . Oxycodone Other (See Comments)    "makes me loopy"  . Neomycin-Bacitracin Zn-Polymyx Rash    Patient Measurements: Height: 6\' 2"  (188 cm) Weight: 168 lb 14 oz (76.6 kg) IBW/kg (Calculated) : 82.2 Adjusted Body Weight: n/a  Vital Signs: Temp: 97.6 F (36.4 C) (07/09 0800) Temp Source: Axillary (07/09 0800) BP: 151/134 mmHg (07/09 1800) Pulse Rate: 126 (07/09 1800) Intake/Output from previous day: 07/08 0701 - 07/09 0700 In: -  Out: 1975 [Urine:1975] Intake/Output from this shift:    Labs:  Recent Labs  02/17/15 0214 02/18/15 0638 02/19/15 0600 02/19/15 1521  WBC 8.9 7.4 8.5  --   HGB 15.7 14.1 14.4  --   HCT 48.2 43.1 43.5  --   PLT 224 200 189  --   CREATININE 3.20* 1.71* 1.30*  --   MG  --  2.1 1.9  --   PHOS  --   --  2.4* 4.2  ALBUMIN 2.2*  --   --   --   PROT 6.7  --   --   --   AST 131*  --   --   --   ALT 51  --   --   --   ALKPHOS 197*  --   --   --   BILITOT 2.0*  --   --   --    Estimated Creatinine Clearance: 50.7 mL/min (by C-G formula based on Cr of 1.3).   Microbiology: Recent Results (from the past 720 hour(s))  Culture, blood (routine x 2)     Status: None   Collection Time: 01/27/15  3:03 PM  Result Value Ref Range Status   Specimen Description BLOOD  Final   Special Requests Normal  Final   Culture NO GROWTH 5 DAYS  Final   Report Status 02/01/2015 FINAL  Final  Culture, blood (routine x 2)     Status: None   Collection Time: 01/27/15  3:03 PM  Result Value Ref Range Status   Specimen Description BLOOD  Final   Special Requests Normal  Final   Culture NO GROWTH 5 DAYS  Final   Report Status 02/01/2015 FINAL  Final  Wound culture     Status: None   Collection Time: 01/28/15  1:05 PM  Result Value Ref Range Status   Specimen Description LEG  Final   Special Requests Normal  Final   Gram Stain NO WBC  SEEN RARE GRAM NEGATIVE RODS   Final   Culture   Final    MODERATE GROWTH PSEUDOMONAS AERUGINOSA LIGHT GROWTH ENTEROCOCCUS FAECALIS LIGHT GROWTH PROTEUS MIRABILIS    Report Status 02/02/2015 FINAL  Final   Organism ID, Bacteria PSEUDOMONAS AERUGINOSA  Final   Organism ID, Bacteria ENTEROCOCCUS FAECALIS  Final   Organism ID, Bacteria PROTEUS MIRABILIS  Final      Susceptibility   Proteus mirabilis - MIC*    AMPICILLIN >=32 RESISTANT Resistant     CEFAZOLIN 8 SENSITIVE Sensitive     CEFTRIAXONE <=1 SENSITIVE Sensitive     CIPROFLOXACIN <=0.25 SENSITIVE Sensitive     GENTAMICIN >=16 RESISTANT Resistant     IMIPENEM 2 SENSITIVE Sensitive     TRIMETH/SULFA <=20 SENSITIVE Sensitive     CEFOXITIN <=4 SENSITIVE Sensitive     PIP/TAZO Value in next row Sensitive      SENSITIVE<=4    CEFTAZIDIME Value in next  row Sensitive      SENSITIVE<=1    * LIGHT GROWTH PROTEUS MIRABILIS   Pseudomonas aeruginosa - MIC*    CEFTAZIDIME Value in next row Sensitive      SENSITIVE<=1    CIPROFLOXACIN Value in next row Sensitive      SENSITIVE<=1    GENTAMICIN Value in next row Sensitive      SENSITIVE<=1    IMIPENEM Value in next row Sensitive      SENSITIVE<=1    PIP/TAZO Value in next row Sensitive      SENSITIVE8    * MODERATE GROWTH PSEUDOMONAS AERUGINOSA   Enterococcus faecalis - MIC*    AMPICILLIN Value in next row Sensitive      SENSITIVE<=2    LINEZOLID Value in next row Sensitive      SENSITIVE2    * LIGHT GROWTH ENTEROCOCCUS FAECALIS  Blood culture (routine x 2)     Status: None (Preliminary result)   Collection Time: 02/17/15  2:30 AM  Result Value Ref Range Status   Specimen Description BLOOD LEFT WRIST  Final   Special Requests BOTTLES DRAWN AEROBIC AND ANAEROBIC 7ML  Final   Culture  Setup Time   Final    GRAM NEGATIVE RODS ANAEROBIC BOTTLE ONLY CRITICAL RESULT CALLED TO, READ BACK BY AND VERIFIED WITH: MICHELLE WILLIAMS AT 2026 02/17/15 SDR CONFIRMED BY SFW    Culture    Final    PROTEUS MIRABILIS ANAEROBIC BOTTLE ONLY COAGULASE NEGATIVE STAPHYLOCOCCUS CALL MICROBIOLOGY LAB IF SENSITIVITIES ARE REQUIRED. AEROBIC BOTTLE ONLY POSSIBLE CONTAMINATION WITH SKIN FLORA    Report Status PENDING  Incomplete   Organism ID, Bacteria PROTEUS MIRABILIS  Final      Susceptibility   Proteus mirabilis - MIC*    AMPICILLIN >=32 RESISTANT Resistant     CEFTAZIDIME <=1 SENSITIVE Sensitive     CEFAZOLIN 8 SENSITIVE Sensitive     CEFTRIAXONE <=1 SENSITIVE Sensitive     CIPROFLOXACIN <=0.25 SENSITIVE Sensitive     GENTAMICIN >=16 RESISTANT Resistant     IMIPENEM 8 INTERMEDIATE Intermediate     TRIMETH/SULFA <=20 SENSITIVE Sensitive     * PROTEUS MIRABILIS  Blood culture (routine x 2)     Status: None (Preliminary result)   Collection Time: 02/17/15  2:53 AM  Result Value Ref Range Status   Specimen Description BLOOD LEFT HAND  Final   Special Requests BOTTLES DRAWN AEROBIC AND ANAEROBIC 7ML  Final   Culture  Setup Time   Final    GRAM NEGATIVE RODS AEROBIC BOTTLE ONLY CRITICAL RESULT CALLED TO, READ BACK BY AND VERIFIED WITH: MICHELLE WILLIAMS @0451  02/18/15 BY AJO CONFIRMED BY [facility]    Culture   Final    PROTEUS MIRABILIS SUSCEPTIBILITIES TO FOLLOW AEROBIC BOTTLE ONLY    Report Status PENDING  Incomplete  MRSA PCR Screening     Status: Abnormal   Collection Time: 02/17/15  8:45 AM  Result Value Ref Range Status   MRSA by PCR POSITIVE (A) NEGATIVE Final    Comment:        The GeneXpert MRSA Assay (FDA approved for NASAL specimens only), is one component of a comprehensive MRSA colonization surveillance program. It is not intended to diagnose MRSA infection nor to guide or monitor treatment for MRSA infections. CRITICAL RESULT CALLED TO, READ BACK BY AND VERIFIED WITH: STACY COLLIE @ [phone] 02/17/15 B[facility]   C difficile quick scan w PCR reflex ([facility] only)     Status: None   Collection Time: 02/18/15 11:39  AM  Result Value Ref Range Status   C Diff antigen  NEGATIVE  Final   C Diff toxin NEGATIVE  Final   C Diff interpretation Negative for C. difficile  Final    Medications:  Scheduled:  . amiodarone  200 mg Oral Daily  . Chlorhexidine Gluconate Cloth  6 each Topical Q0600  . ciprofloxacin  200 mg Intravenous Q12H  . collagenase   Topical Daily  . heparin  5,000 Units Subcutaneous 3 times per day  . mupirocin ointment  1 application Nasal BID  . pantoprazole  40 mg Oral QAC breakfast  . polyethylene glycol  17 g Oral Daily  . potassium chloride  10 mEq Intravenous Q1 Hr x 6  . potassium chloride  40 mEq Oral Once  . tamsulosin  0.4 mg Oral Daily    Assessment: 7/9 14:00 K=2.9 and Phos=4.2. Patient received KPhos IV once today.  Goal of Therapy:  Normalization of electrolytes  Plan:  Will give KCl IV x 6 runs for a total of . Will follow up on am labs.  Clovia Cuff, PharmD, BCPS 02/19/2015 7:17 PM

## 2015-02-19 NOTE — Progress Notes (Signed)
[facility] [place] [facility]   PATIENT NAME: Ronald CrookSamuel Good    MR#:  [id number]  DATE OF BIRTH:  [birth year]  SUBJECTIVE:  CHIEF COMPLAINT:   Chief Complaint  Patient presents with  . Altered Mental Status  No complaint, but the patient is confused and very somnolent, mumbles by himself and not able to provide review of systems or history. Patient's daughter is at bedside, reports patient being very active few months ago before he was diagnosed with extremity cellulitis and sent to skilled nursing facility, where he has been deteriorating over the past few weeks. Nurses are reporting significant lower extremity ulcerations , including areas of necrosis in the patient's lower extremities below his knees and feet, prior evaluation by Dr. Wyn Quakerew, as well as Dr. Gilda CreaseSchnier, however, no angiogram was performed so far. Patient's kidney function is better today  REVIEW OF SYSTEMS:  Review of systems is not available as patient is confused and very somnolent, barely opens his eyes and mumbles by himself  DRUG ALLERGIES:   Allergies  Allergen Reactions  . Oxycodone Other (See Comments)    "makes me loopy"  . Neomycin-Bacitracin Zn-Polymyx Rash    VITALS:  Blood pressure 127/71, pulse 101, temperature 97.6 F (36.4 C), temperature source Axillary, resp. rate 18, height 6\' 2"  (1.88 m), weight 76.204 kg (168 lb), SpO2 100 %.  PHYSICAL EXAMINATION:  GENERAL:  81 y.o.-year-old patient lying in the bed critical ill looking. Head is placed upwards, a patient is poorly responsive to light touch and to verbal stimulation and briefly opens his eyes but then goes back to sleep , open-mouthed EYES: Pupils equal, round, reactive to light and accommodation. No scleral icterus. Extraocular muscles intact.  HEENT: Head atraumatic, normocephalic. Oropharynx and nasopharynx clear. Very dry oral mucosa. NECK:  Supple, no jugular venous distention. No thyroid enlargement, no tenderness.   LUNGS: Normal breath sounds bilaterally, no wheezing, rales,rhonchi or crepitation, diminished breath sounds due to poor effort. No use of accessory muscles of respiration.  CARDIOVASCULAR: S1, S2 normal. No murmurs, rubs, or gallops.  ABDOMEN: Soft, nontender, nondistended. Bowel sounds present. No organomegaly or mass.  EXTREMITIES: trace pedal edema, open wounds were noted in the anterior shins in lower part, especially on the left with some necrotic areas of the toes and metacarpal phalangeal area. Extensive ulcerations in the anterior aspect of the right lower extremity. dressing is applied. No infection signs were noted. Presence of necrosis on the right side as well . Peripheral pulses are nonpalpable.  Severe pain on manipulation of the patient's feet NEUROLOGIC: Cranial nerves II through XII are intact. Muscle strength 3/5 in all extremities. Sensation intact. Gait not checked.  PSYCHIATRIC: The patient is somnolent , confused.  SKIN: Sacral DU stage 2. Leg wound in dressing. Poor turgor.   LABORATORY PANEL:   CBC  Recent Labs Lab 02/19/15 0600  WBC 8.5  HGB 14.4  HCT 43.5  PLT 189   ------------------------------------------------------------------------------------------------------------------  Chemistries   Recent Labs Lab 02/17/15 0214  02/19/15 0600  NA 149*  < > 153*  K 4.7  < > 2.9*  CL 107  < > 117*  CO2 29  < > 29  GLUCOSE 221*  < > 129*  BUN 91*  < > 46*  CREATININE 3.20*  < > 1.30*  CALCIUM 8.7*  < > 8.4*  MG  --   < > 1.9  AST 131*  --   --   ALT 51  --   --  ALKPHOS 197*  --   --   BILITOT 2.0*  --   --   < > = values in this interval not displayed. ------------------------------------------------------------------------------------------------------------------  Cardiac Enzymes  Recent Labs Lab 02/17/15 0214  TROPONINI 0.03    ------------------------------------------------------------------------------------------------------------------  RADIOLOGY:  US Renal  02/17/2015   CLINICAL DATA:  Acute renal failure  EXAM: RENAL ULTRASOUND  COMPARISON:  CT abdomen and pelvis July 27, 2014  FINDINGS: Right Kidney:  Length: 9.5 cm. Echogenicity within normal limits. Renal cortical thickness is low normal. No mass, perinephric fluid, or hydronephrosis visualized. No sonographically demonstrable calculus or ureterectasis.  Left Kidney:  Length: 11.1 cm. Echogenicity and renal cortical thickness are within normal limits. No perinephric fluid or hydronephrosis visualized. There is a cyst arising from the lower pole of the left kidney measuring 3.8 x 3.7 x 3.2 cm, documented previously. No other renal mass. No sonographically demonstrable calculus or ureterectasis.  Bladder:  Urinary bladder is decompressed with a catheter and therefore cannot be assessed.  IMPRESSION: Simple cyst lower pole left kidney. Study otherwise within normal limits.   Electronically Signed   By: Bretta Bang III M.D.   On: 02/17/2015 16:02   Dg Chest Port 1 View  02/17/2015   CLINICAL DATA:  Central catheter placement  EXAM: PORTABLE CHEST - 1 VIEW  COMPARISON:  Study obtained earlier in the day  FINDINGS: Central catheter tip is in the superior vena cava. No pneumothorax. There is probable atelectatic change in medial right base. Lungs elsewhere clear. Heart is mildly enlarged with pulmonary vascularity within normal limits. No adenopathy. There is atherosclerotic change in the aortic arch region.  IMPRESSION: Central catheter tip in superior vena cava. No demonstrable pneumothorax. Probable atelectasis medial right base. No new opacity. No change in cardiac silhouette.   Electronically Signed   By: Bretta Bang III M.D.   On: 02/17/2015 21:19    EKG:   Orders placed or performed during the hospital encounter of 02/17/15  . EKG 12-Lead  . EKG  12-Lead    ASSESSMENT AND PLAN:   HAP and Proteus mirabilis septicemia.  Continue vancomycin and zosyn, sats ciprofloxacin IV, white blood cell count is normal  Septic shock. Tapering levophed drip , now on 2 mcg, Holding lasix, toprol and metolazone.  ARF. Status post NS iv bolus then 75 ml/h. Follow up BMP. Hold lasix, toprol and metolazone. Kidney US: no obstruction. Renal function has improved  Hypernatremia. Na 153 today. Change patient's IV fluids to D5 water. Follow up BMP tomorrow in the morning and quite frequently. Today.  Hypokalemia. KCl in IV fluids. F/u BMP later in the day today.   Lactic acidosis. improved Sacral decubitus ulcer stage 2 and  leg wound. Wound care appreciated. Getting vascular surgery involvement of a possible angiogram since patient's wounds may not heal due to poor peripheral peripheral perfusion.   AFib.Continue amiodarone and resume pradaxa  Chronic systolic CHF. Stable even with IV fluid administration.     All the records are reviewed and case discussed with Care Management/Social Workerr. Management plans discussed with the patient, family and they are in agreement.  CODE STATUS: FULL CODE (I discussed with patient's daughter yesterday. Her sister is POA. Full code for now.)  TOTAL CRITICAL TIME TAKING CARE OF THIS PATIENT: 50 minutes.  Prolonged discussion with patient's daughter about patient's lower extremities and need for vascular surgery evaluation as well as healing process and her expectations  Sinclair Arrazola M.D on 02/19/2015 at 12:39 PM  Between  7am to 6pm - Pager - [phone]  After 6pm go to [email or web address] - password [email or web address] [facility]  [facility]  Office  [phone]  CC: Primary care physician; Marisue Ivan, MD

## 2015-02-19 NOTE — Progress Notes (Addendum)
ANTIBIOTIC CONSULT NOTE - INITIAL  Pharmacy Consult for Vancomycin and Zosyn  Indication: rule out pneumonia  Allergies  Allergen Reactions  . Oxycodone Other (See Comments)    "makes me loopy"  . Neomycin-Bacitracin Zn-Polymyx Rash    Patient Measurements: Height: 6\' 2"  (188 cm) Weight: 168 lb (76.204 kg) IBW/kg (Calculated) : 82.2 TBW: 76.2 kg  Vital Signs: Temp: 97.6 F (36.4 C) (07/09 0800) Temp Source: Axillary (07/09 0800) BP: 127/71 mmHg (07/09 0900) Pulse Rate: 101 (07/09 0900) Intake/Output from previous day: 07/08 0701 - 07/09 0700 In: -  Out: 1975 [Urine:1975] Intake/Output from this shift:    Labs:  Recent Labs  02/17/15 0214 02/18/15 0638 02/19/15 0600  WBC 8.9 7.4 8.5  HGB 15.7 14.1 14.4  PLT 224 200 189  CREATININE 3.20* 1.71* 1.30*   Estimated Creatinine Clearance: 50.5 mL/min (by C-G formula based on Cr of 1.3). No results for input(s): VANCOTROUGH, VANCOPEAK, VANCORANDOM, GENTTROUGH, GENTPEAK, GENTRANDOM, TOBRATROUGH, TOBRAPEAK, TOBRARND, AMIKACINPEAK, AMIKACINTROU, AMIKACIN in the last 72 hours.   Microbiology: Recent Results (from the past 720 hour(s))  Culture, blood (routine x 2)     Status: None   Collection Time: 01/27/15  3:03 PM  Result Value Ref Range Status   Specimen Description BLOOD  Final   Special Requests Normal  Final   Culture NO GROWTH 5 DAYS  Final   Report Status 02/01/2015 FINAL  Final  Culture, blood (routine x 2)     Status: None   Collection Time: 01/27/15  3:03 PM  Result Value Ref Range Status   Specimen Description BLOOD  Final   Special Requests Normal  Final   Culture NO GROWTH 5 DAYS  Final   Report Status 02/01/2015 FINAL  Final  Wound culture     Status: None   Collection Time: 01/28/15  1:05 PM  Result Value Ref Range Status   Specimen Description LEG  Final   Special Requests Normal  Final   Gram Stain NO WBC SEEN RARE GRAM NEGATIVE RODS   Final   Culture   Final    MODERATE GROWTH PSEUDOMONAS  AERUGINOSA LIGHT GROWTH ENTEROCOCCUS FAECALIS LIGHT GROWTH PROTEUS MIRABILIS    Report Status 02/02/2015 FINAL  Final   Organism ID, Bacteria PSEUDOMONAS AERUGINOSA  Final   Organism ID, Bacteria ENTEROCOCCUS FAECALIS  Final   Organism ID, Bacteria PROTEUS MIRABILIS  Final      Susceptibility   Proteus mirabilis - MIC*    AMPICILLIN >=32 RESISTANT Resistant     CEFAZOLIN 8 SENSITIVE Sensitive     CEFTRIAXONE <=1 SENSITIVE Sensitive     CIPROFLOXACIN <=0.25 SENSITIVE Sensitive     GENTAMICIN >=16 RESISTANT Resistant     IMIPENEM 2 SENSITIVE Sensitive     TRIMETH/SULFA <=20 SENSITIVE Sensitive     CEFOXITIN <=4 SENSITIVE Sensitive     PIP/TAZO Value in next row Sensitive      SENSITIVE<=4    CEFTAZIDIME Value in next row Sensitive      SENSITIVE<=1    * LIGHT GROWTH PROTEUS MIRABILIS   Pseudomonas aeruginosa - MIC*    CEFTAZIDIME Value in next row Sensitive      SENSITIVE<=1    CIPROFLOXACIN Value in next row Sensitive      SENSITIVE<=1    GENTAMICIN Value in next row Sensitive      SENSITIVE<=1    IMIPENEM Value in next row Sensitive      SENSITIVE<=1    PIP/TAZO Value in next row Sensitive  SENSITIVE8    * MODERATE GROWTH PSEUDOMONAS AERUGINOSA   Enterococcus faecalis - MIC*    AMPICILLIN Value in next row Sensitive      SENSITIVE<=2    LINEZOLID Value in next row Sensitive      SENSITIVE2    * LIGHT GROWTH ENTEROCOCCUS FAECALIS  Blood culture (routine x 2)     Status: None (Preliminary result)   Collection Time: 02/17/15  2:30 AM  Result Value Ref Range Status   Specimen Description BLOOD LEFT WRIST  Final   Special Requests BOTTLES DRAWN AEROBIC AND ANAEROBIC  Final   Culture  Setup Time   Final    GRAM NEGATIVE RODS ANAEROBIC BOTTLE ONLY CRITICAL RESULT CALLED TO, READ BACK BY AND VERIFIED WITH: MICHELLE WILLIAMS AT 2026 02/17/15 SDR CONFIRMED BY SFW    Culture   Final    PROTEUS MIRABILIS ANAEROBIC BOTTLE ONLY COAGULASE NEGATIVE STAPHYLOCOCCUS CALL  MICROBIOLOGY LAB IF SENSITIVITIES ARE REQUIRED. AEROBIC BOTTLE ONLY POSSIBLE CONTAMINATION WITH SKIN FLORA    Report Status PENDING  Incomplete   Organism ID, Bacteria PROTEUS MIRABILIS  Final      Susceptibility   Proteus mirabilis - MIC*    AMPICILLIN >=32 RESISTANT Resistant     CEFTAZIDIME <=1 SENSITIVE Sensitive     CEFAZOLIN 8 SENSITIVE Sensitive     CEFTRIAXONE <=1 SENSITIVE Sensitive     CIPROFLOXACIN <=0.25 SENSITIVE Sensitive     GENTAMICIN >=16 RESISTANT Resistant     IMIPENEM 8 INTERMEDIATE Intermediate     TRIMETH/SULFA <=20 SENSITIVE Sensitive     * PROTEUS MIRABILIS  Blood culture (routine x 2)     Status: None (Preliminary result)   Collection Time: 02/17/15  2:53 AM  Result Value Ref Range Status   Specimen Description BLOOD LEFT HAND  Final   Special Requests BOTTLES DRAWN AEROBIC AND ANAEROBIC  Final   Culture  Setup Time   Final    GRAM NEGATIVE RODS AEROBIC BOTTLE ONLY CRITICAL RESULT CALLED TO, READ BACK BY AND VERIFIED WITH: MICHELLE WILLIAMS  02/18/15 BY AJO CONFIRMED BY BGB    Culture   Final    PROTEUS MIRABILIS SUSCEPTIBILITIES TO FOLLOW AEROBIC BOTTLE ONLY    Report Status PENDING  Incomplete  MRSA PCR Screening     Status: Abnormal   Collection Time: 02/17/15  8:45 AM  Result Value Ref Range Status   MRSA by PCR POSITIVE (A) NEGATIVE Final    Comment:        The GeneXpert MRSA Assay (FDA approved for NASAL specimens only), is one component of a comprehensive MRSA colonization surveillance program. It is not intended to diagnose MRSA infection nor to guide or monitor treatment for MRSA infections. CRITICAL RESULT CALLED TO, READ BACK BY AND VERIFIED WITH: STACY COLLIE @ [phone] 02/17/15 BGB   C difficile quick scan w PCR reflex ([facility] only)     Status: None   Collection Time: 02/18/15 11:39 AM  Result Value Ref Range Status   C Diff antigen NEGATIVE  Final   C Diff toxin NEGATIVE  Final   C Diff interpretation Negative for C.  difficile  Final    Medical History: Past Medical History  Diagnosis Date  . Chronic systolic congestive heart failure   . BPH (benign prostatic hyperplasia)   . Atrial fibrillation   . Essential hypertension   . Gastroesophageal reflux disease     Medications:  Scheduled:  . amiodarone  200 mg Oral Daily  . Chlorhexidine Gluconate Cloth  6 each Topical Q0600  . collagenase   Topical Daily  . heparin  5,000 Units Subcutaneous 3 times per day  . mupirocin ointment  1 application Nasal BID  . pantoprazole  40 mg Oral QAC breakfast  . piperacillin-tazobactam  3.375 g Intravenous 3 times per day  . polyethylene glycol  17 g Oral Daily  . potassium chloride  40 mEq Oral Once  . potassium phosphate IVPB (mEq)  40 mEq Intravenous Once  . tamsulosin  0.4 mg Oral Daily  . vancomycin  750 mg Intravenous Q12H   Assessment: Patient being treated empirically for pneumonia. Patient's renal function continues to improve.  SCr today of 1.3 with est CrCl~50.5 mL/min.  PK parameters: Kel (hr-1): 0.046 Half-life (hrs): 15 h Vd (liters): 53.34 (factor used: 0.7 L/kg)  Goal of Therapy:  Vancomycin trough level 15-20 mcg/ml.  Plan:  Patient is currently ordered Vancomycin 750 mg IV q18h.  Based on improved renal function and therefore change in kinetic calculations, will transition patient to Vancomycin 750 mg IV q12h. Will check a trough prior to 5th dose on 7/11 at 0730.  Will f/u with SCr in AM for monitoring purposes.  Will continue Zosyn 3.375 g IV q8 hours.   Clarisa Schools, PharmD Clinical Pharmacist 02/19/2015

## 2015-02-19 NOTE — Clinical Social Work Note (Signed)
Clinical Social Worker met with pt family to address questions regarding pt's potential return to [facility]. CSW confirmed with family, after speaking with facility, that pt is able to return and they do not require a bed hold. [employer] [facility] is needed prior to discharge. Family is interested in [facility]. Bed search initiated. No bed offers at time of note. Will follow up the first of the week. CSW will continue to follow.   Darden Dates, MSW, LCSW Clinical Social Worker  [phone]

## 2015-02-19 NOTE — Progress Notes (Signed)
PARENTERAL NUTRITION CONSULT NOTE - FOLLOW UP  Pharmacy Consult for Electrolyte Mgmt  Allergies  Allergen Reactions  . Oxycodone Other (See Comments)    "makes me loopy"  . Neomycin-Bacitracin Zn-Polymyx Rash    Patient Measurements: Height: 6\' 2"  (188 cm) Weight: 168 lb (76.204 kg) IBW/kg (Calculated) : 82.2  Vital Signs: Temp: 98.5 F (36.9 C) (07/09 0400) Temp Source: Axillary (07/09 0400) BP: 122/77 mmHg (07/09 0600) Pulse Rate: 107 (07/09 0600) Intake/Output from previous day: 07/08 0701 - 07/09 0700 In: -  Out: 1975 [Urine:1975] Intake/Output from this shift:    Labs:  Recent Labs  02/17/15 0214 02/18/15 0638 02/19/15 0600  WBC 8.9 7.4 8.5  HGB 15.7 14.1 14.4  HCT 48.2 43.1 43.5  PLT 224 200 189     Recent Labs  02/17/15 0214 02/18/15 0638 02/19/15 0600  NA 149* 154* 153*  K 4.7 3.1* 2.9*  CL 107 117* 117*  CO2 29 28 29   GLUCOSE 221* 96 129*  BUN 91* 70* 46*  CREATININE 3.20* 1.71* 1.30*  CALCIUM 8.7* 8.5* 8.4*  MG  --  2.1 1.9  PHOS  --   --  2.4*  PROT 6.7  --   --   ALBUMIN 2.2*  --   --   AST 131*  --   --   ALT 51  --   --   ALKPHOS 197*  --   --   BILITOT 2.0*  --   --    Estimated Creatinine Clearance: 50.5 mL/min (by C-G formula based on Cr of 1.3).    Recent Labs  02/17/15 0829 02/17/15 1603 02/17/15 1606  GLUCAP 79 65 75    Medications:  Scheduled:  . amiodarone  200 mg Oral Daily  . Chlorhexidine Gluconate Cloth  6 each Topical Q0600  . collagenase   Topical Daily  . heparin  5,000 Units Subcutaneous 3 times per day  . mupirocin ointment  1 application Nasal BID  . pantoprazole  40 mg Oral QAC breakfast  . piperacillin-tazobactam  3.375 g Intravenous 3 times per day  . polyethylene glycol  17 g Oral Daily  . potassium chloride  40 mEq Oral Once  . potassium phosphate IVPB (mEq)  40 mEq Intravenous Once  . tamsulosin  0.4 mg Oral Daily  . vancomycin  750 mg Intravenous Q18H   Infusions:  . dextrose 75 mL/hr at  02/18/15 1958  . norepinephrine (LEVOPHED) Adult infusion 2 mcg/min (02/18/15 2000)   PRN: diazepam, morphine injection, traMADol  Assessment:  Pharmacy consulted to manage electrolytes in this 81 y/o M with PNA. Potassium and phosphorus are low and sodium is high. Of note, patient is on D5 at 75 ml/hr.   Plan:  Will bolus Kphos 40 meq in d5 and recheck potassium and phosphorus at 1400.  Ronald Good, Ronald Good 02/19/2015,7:10 AM

## 2015-02-19 NOTE — Progress Notes (Signed)
Speech Language Pathology Treatment: Dysphagia  Patient Details Name: Ronald Good MRN: [id number] DOB: 24-Jul-1934 Today's Date: 02/19/2015 Time: [id number] SLP Time Calculation (min) (ACUTE ONLY): 38 min  Assessment / Plan / Recommendation Clinical Impression  Pt appears to present w/ inconsistent, delayed overt s/s of aspiration (cough) w/ trials of nectar liquids; this lessens w/ trials of Honey consistency liquids. Pt appears at increased risk for aspiration sec. To his overall declined medical and Cognitive status'. Rec. Diet downgrade to Honey liquids, purees. Strict aspiration precautions and consideration of objective swallow evaluation on Monday for further assessment per MD ok. NSG updated.    HPI Other Pertinent Information: pt is having some inconsistencies w/ the toleration of the Nectar liquids during meals; noted a delayed coudh intermittently. Pt is awake, confused and requires cues.    Pertinent Vitals Pain Assessment: No/denies pain  SLP Plan  Continue with current plan of care;MBS (modify liquids to Honey consistency)    Recommendations Diet recommendations: Dysphagia 1 (puree);Honey-thick liquid Liquids provided via: Teaspoon;Cup Medication Administration: Crushed with puree Supervision: Staff to assist with self feeding;Full supervision/cueing for compensatory strategies Compensations: Small sips/bites;Check for pocketing;Multiple dry swallows after each bite/sip;Follow solids with liquid Postural Changes and/or Swallow Maneuvers: Seated upright 90 degrees              Oral Care Recommendations: Oral care BID;Oral care before and after PO;Staff/trained caregiver to provide oral care Plan: Continue with current plan of care;MBS (modify liquids to Honey consistency)    GO     Dorcas Melito 02/19/2015, 6:26 PM

## 2015-02-19 NOTE — Consult Note (Signed)
[facility] Vascular Consult Note  MRN : [id number]  Ronald Good is a 81 y.o. (1934/03/23) male who presents with chief complaint of  Chief Complaint  Patient presents with  . Altered Mental Status  .  History of Present Illness: Patient admitted to the hospital yesterday with altered mental status, hypoxia, and hypotension. He had been hospitalized last month for lower extremity cellulitis and wounds. He was getting Unna boot therapies but his wounds have worsened dramatically over the past several weeks. He now has gangrenous changes to the toes on both feet as well as large open ulcerations on both calves. He can provide essentially no history, but his legs are extremely painful to the touch. This has been progressing at his skilled nursing facility over the past couple of weeks. He was discharged to a skilled nursing facility after his last hospitalization. He has an extensive past medical history including severe heart disease, cardiac arrhythmias, and other issues. He is septic on this admission with hypotension requiring pressors and hypoxia. Aspiration pneumonia is a presumptive diagnosis but his lower extremity wounds are felt to be a possible source as well. Despite appropriate wound care, his wounds continue to progress and not improved. He started swelling and weeping a couple of months ago as per the old history. Again, he can provide basically no history and is almost obtunded except for response to painful stimuli.  Current Facility-Administered Medications  Medication Dose Route Frequency Provider Last Rate Last Dose  . amiodarone (PACERONE) tablet 200 mg  200 mg Oral Daily Shaune Pollack, MD   200 mg at 02/19/15 1213  . Chlorhexidine Gluconate Cloth 2 % PADS 6 each  6 each Topical Q0600 Shaune Pollack, MD   6 each at 02/19/15 0449  . ciprofloxacin (CIPRO) IVPB 200 mg  200 mg Intravenous Q12H Katharina Caper, MD      . collagenase (SANTYL) ointment   Topical  Daily Shaune Pollack, MD      . dextrose 5 % solution   Intravenous Continuous Erin Fulling, MD 75 mL/hr at 02/19/15 1128    . diazepam (VALIUM) injection 5 mg  5 mg Intravenous Q4H PRN Katharina Caper, MD   5 mg at 02/18/15 2028  . heparin injection 5,000 Units  5,000 Units Subcutaneous 3 times per day Shaune Pollack, MD   5,000 Units at 02/19/15 [phone]  . morphine 2 MG/ML injection 2 mg  2 mg Intravenous Q2H PRN Shaune Pollack, MD   2 mg at 02/19/15 1129  . mupirocin ointment (BACTROBAN) 2 % 1 application  1 application Nasal BID Shaune Pollack, MD   1 application at 02/19/15 1130  . norepinephrine (LEVOPHED) 16 mg in dextrose 5 % 250 mL (0.064 mg/mL) infusion  0-40 mcg/min Intravenous Titrated Gale Journey, MD 1.9 mL/hr at 02/19/15 0900 2 mcg/min at 02/19/15 0900  . pantoprazole (PROTONIX) EC tablet 40 mg  40 mg Oral QAC breakfast Shaune Pollack, MD   40 mg at 02/19/15 1212  . polyethylene glycol (MIRALAX / GLYCOLAX) packet 17 g  17 g Oral Daily Shaune Pollack, MD   17 g at 02/17/15 0946  . potassium chloride SA (K-DUR,KLOR-CON) CR tablet 40 mEq  40 mEq Oral Once Shaune Pollack, MD   40 mEq at 02/18/15 1311  . potassium phosphate 40 mEq in dextrose 5 % 500 mL infusion  40 mEq Intravenous Once Shaune Pollack, MD   40 mEq at 02/19/15 0751  . tamsulosin (FLOMAX) capsule 0.4 mg  0.4  mg Oral Daily Shaune Pollack, MD   0.4 mg at 02/19/15 1212  . traMADol (ULTRAM) tablet 50 mg  50 mg Oral Q12H PRN Shaune Pollack, MD        Past Medical History  Diagnosis Date  . Chronic systolic congestive heart failure   . BPH (benign prostatic hyperplasia)   . Atrial fibrillation   . Essential hypertension   . Gastroesophageal reflux disease     Past Surgical History  Procedure Laterality Date  . Replacement total knee bilateral Bilateral     Social History History  Substance Use Topics  . Smoking status: Never Smoker   . Smokeless tobacco: Not on file  . Alcohol Use: No   currently resides in a skilled nursing facility. By report, was living  independently earlier this year.  Family History History reviewed. No pertinent family history. he is unable to provide history. Specifically there is no mention in the record of bleeding disorders, autoimmune diseases, clotting problems, or aneurysmal disease.  Allergies  Allergen Reactions  . Oxycodone Other (See Comments)    "makes me loopy"  . Neomycin-Bacitracin Zn-Polymyx Rash     REVIEW OF SYSTEMS (Negative unless checked) Unable to obtain secondary to patient's poor mental status and inability to provide history.   Physical Examination  Filed Vitals:   02/19/15 0600 02/19/15 0700 02/19/15 0800 02/19/15 0900  BP: 122/77 126/100 135/70 127/71  Pulse: 107 104 101 101  Temp:   97.6 F (36.4 C)   TempSrc:   Axillary   Resp: 16 20 17 18   Height:      Weight:      SpO2: 100% 100% 100% 100%   Body mass index is 21.56 kg/(m^2). Gen:  WD/WN, critically ill-appearing and agitated. Head: [place]/AT, No temporalis wasting. Prominent temp pulse not noted. Ear/Nose/Throat: Dentition poor. Trachea midline. No JVD present Eyes: PERRLA, EOMI.  Neck: Supple, no nuchal rigidity.  No bruit or JVD.  Pulmonary:  Coarse breath sounds bilaterally. Use of accessory muscles present. Cardiac: Tachycardic and irregular Vascular:  Vessel Right Left  Radial Palpable Palpable  Ulnar Palpable Palpable  Brachial Palpable Palpable  Carotid Palpable, without bruit Palpable, without bruit  Aorta Not palpable N/A  Femoral Palpable Palpable  Popliteal  not Palpable  not Palpable  PT  weekly Palpable  not Palpable  DP  not Palpable  not Palpable   Gastrointestinal: soft, non-tender/non-distended. No guarding/reflex. No masses, surgical incisions, or scars. Musculoskeletal: Lower extremity ulcerations as listed below. Moves all 4 extremities but difficult to assess strength. Mild to moderate lower extremity swelling. Neurologic: Difficult to assess given the patient's altered mental status and gravity  of illness. Seems to move all 4 extremities spontaneously but not following commands. Psychiatric: Patient lethargic and mental status is poor secondary to gravity of his illness. Not oriented or alert. Dermatologic: Large calf ulcerations on bilateral lower extremities with black eschar and fibrinous exudate. Essentially all the toes on both feet demonstrate gangrenous changes or ulcerations. Only mild erythema is present, but significant drainage has been noted. Lymph : No Cervical, Axillary, or Inguinal lymphadenopathy.    CBC Lab Results  Component Value Date   WBC 8.5 02/19/2015   HGB 14.4 02/19/2015   HCT 43.5 02/19/2015   MCV 100.6* 02/19/2015   PLT 189 02/19/2015    BMET    Component Value Date/Time   NA 153* 02/19/2015 0600   NA 138 08/02/2014 0424   K 2.9* 02/19/2015 0600   K 3.6 08/02/2014 0424  CL 117* 02/19/2015 0600   CL 103 08/02/2014 0424   CO2 29 02/19/2015 0600   CO2 28 08/02/2014 0424   GLUCOSE 129* 02/19/2015 0600   GLUCOSE 89 08/02/2014 0424   BUN 46* 02/19/2015 0600   BUN 10 08/02/2014 0424   CREATININE 1.30* 02/19/2015 0600   CREATININE 0.90 08/02/2014 0424   CALCIUM 8.4* 02/19/2015 0600   CALCIUM 8.3* 08/02/2014 0424   GFRNONAA 51* 02/19/2015 0600   GFRNONAA >60 03/24/2014 0532   GFRAA 59* 02/19/2015 0600   GFRAA >60 03/24/2014 0532   Estimated Creatinine Clearance: 50.5 mL/min (by C-G formula based on Cr of 1.3).  COAG Lab Results  Component Value Date   INR 2.2 07/02/2014   INR 1.3 06/26/2014   INR 1.6 05/20/2014     Assessment/Plan 1. Sepsis: Possibly due to aspiration pneumonia although lower extremity wounds are extensive and a possible source of infection as well. He is requiring pressors for hypotension and has very altered mental status. Condition is very serious. 2. Aspiration pneumonia: Being treated with antibiotics. 3. Acute renal failure: Better today than yesterday. This may limit our ability to give him contrast for  angiogram. Will monitor and follow along.  4. Ulceration bilateral lower extremities in the calves and gangrenous changes to toes on both feet: Likely arterial insufficiency. Also may have some venous insufficiency with previous severe swelling and weeping. Discussed with primary service. We'll plan angiogram Monday if his renal function and medical status will allow. Will eventually need second angiogram as only be able to treat one leg at a time. Risk of limb loss extremely high. 5. Hypotension: Requiring pressors. Likely secondary to sepsis but major medical comorbidities associated is well making this a difficult problem. 6. Hypernatremia: Volume as needed. Management per primary service.  Overall, the patient is gravely ill and has a high risk of mortality. Limb losses and expected outcome at this point, but an attempt at limb salvage is reasonable given the fact that he was functional earlier this year. Discussed with primary service and the gravity of the situation was again noted.  DEW,JASON, MD  02/19/2015 1:31 PM

## 2015-02-20 LAB — BASIC METABOLIC PANEL
ANION GAP: 5 (ref 5–15)
BUN: 26 mg/dL — ABNORMAL HIGH (ref 6–20)
CHLORIDE: 110 mmol/L (ref 101–111)
CO2: 31 mmol/L (ref 22–32)
Calcium: 7.6 mg/dL — ABNORMAL LOW (ref 8.9–10.3)
Creatinine, Ser: 0.99 mg/dL (ref 0.61–1.24)
GFR calc non Af Amer: >60 mL/min (ref >60)
Glucose, Bld: 98 mg/dL (ref 65–99)
Potassium: 3.3 mmol/L — ABNORMAL LOW (ref 3.5–5.1)
SODIUM: 146 mmol/L — AB (ref 135–145)

## 2015-02-20 LAB — PHOSPHORUS: PHOSPHORUS: 2.8 mg/dL (ref 2.5–4.6)

## 2015-02-20 LAB — MAGNESIUM: MAGNESIUM: 1.6 mg/dL — AB (ref 1.7–2.4)

## 2015-02-20 MED ORDER — VANCOMYCIN HCL IN DEXTROSE 1-5 GM/200ML-% IV SOLN
1000.0000 mg | Freq: Two times a day (BID) | INTRAVENOUS | Status: DC
  Administered 2015-02-20 – 2015-02-21 (×2): 1000 mg via INTRAVENOUS
  Filled 2015-02-20 (×4): qty 200

## 2015-02-20 MED ORDER — MAGNESIUM SULFATE 4 GM/100ML IV SOLN
4.0000 g | Freq: Once | INTRAVENOUS | Status: AC
  Administered 2015-02-20: 4 g via INTRAVENOUS
  Filled 2015-02-20: qty 100

## 2015-02-20 MED ORDER — POLYETHYLENE GLYCOL 3350 17 G PO PACK
17.0000 g | PACK | Freq: Every day | ORAL | Status: DC | PRN
  Filled 2015-02-20: qty 1

## 2015-02-20 MED ORDER — DEXTROSE 5 % IV SOLN
30.0000 mmol | Freq: Once | INTRAVENOUS | Status: DC
  Filled 2015-02-20: qty 10

## 2015-02-20 MED ORDER — POTASSIUM CHLORIDE CRYS ER 20 MEQ PO TBCR
40.0000 meq | EXTENDED_RELEASE_TABLET | Freq: Once | ORAL | Status: AC
  Administered 2015-02-20: 40 meq via ORAL
  Filled 2015-02-20: qty 2

## 2015-02-20 MED ORDER — POTASSIUM CHLORIDE IN NACL 20-0.9 MEQ/L-% IV SOLN
INTRAVENOUS | Status: DC
  Administered 2015-02-20 – 2015-02-24 (×6): via INTRAVENOUS
  Filled 2015-02-20 (×11): qty 1000

## 2015-02-20 MED ORDER — CIPROFLOXACIN IN D5W 400 MG/200ML IV SOLN
400.0000 mg | Freq: Two times a day (BID) | INTRAVENOUS | Status: DC
  Administered 2015-02-20 – 2015-03-01 (×18): 400 mg via INTRAVENOUS
  Filled 2015-02-20 (×21): qty 200

## 2015-02-20 MED ORDER — SODIUM CHLORIDE 0.9 % IV BOLUS (SEPSIS)
500.0000 mL | Freq: Once | INTRAVENOUS | Status: AC
  Administered 2015-02-20: 500 mL via INTRAVENOUS

## 2015-02-20 NOTE — Progress Notes (Addendum)
MEDICATION RELATED CONSULT NOTE - FOLLOW UP   Pharmacy Consult for Electrolyte Management   Allergies  Allergen Reactions  . Oxycodone Other (See Comments)    "makes me loopy"  . Neomycin-Bacitracin Zn-Polymyx Rash    Patient Measurements: Height: 6\' 2"  (188 cm) Weight: 168 lb 14 oz (76.6 kg) IBW/kg (Calculated) : 82.2 Adjusted Body Weight: n/a  Vital Signs: Temp: 98.5 F (36.9 C) (07/10 0400) Temp Source: Axillary (07/10 0400) BP: 104/70 mmHg (07/10 0745) Pulse Rate: 96 (07/10 0745) Intake/Output from previous day: 07/09 0701 - 07/10 0700 In: 600 [IV Piggyback:600] Out: 2300 [Urine:2300] Intake/Output from this shift: Total I/O In: 75 [I.V.:75] Out: -   Labs:  Recent Labs  02/18/15 0638 02/19/15 0600 02/19/15 1521 02/20/15 0510  WBC 7.4 8.5  --   --   HGB 14.1 14.4  --   --   HCT 43.1 43.5  --   --   PLT 200 189  --   --   CREATININE 1.71* 1.30*  --  0.99  MG 2.1 1.9  --  1.6*  PHOS  --  2.4* 4.2 2.8   Estimated Creatinine Clearance: 66.6 mL/min (by C-G formula based on Cr of 0.99).   Microbiology: Recent Results (from the past 720 hour(s))  Culture, blood (routine x 2)     Status: None   Collection Time: 01/27/15  3:03 PM  Result Value Ref Range Status   Specimen Description BLOOD  Final   Special Requests Normal  Final   Culture NO GROWTH 5 DAYS  Final   Report Status 02/01/2015 FINAL  Final  Culture, blood (routine x 2)     Status: None   Collection Time: 01/27/15  3:03 PM  Result Value Ref Range Status   Specimen Description BLOOD  Final   Special Requests Normal  Final   Culture NO GROWTH 5 DAYS  Final   Report Status 02/01/2015 FINAL  Final  Wound culture     Status: None   Collection Time: 01/28/15  1:05 PM  Result Value Ref Range Status   Specimen Description LEG  Final   Special Requests Normal  Final   Gram Stain NO WBC SEEN RARE GRAM NEGATIVE RODS   Final   Culture   Final    MODERATE GROWTH PSEUDOMONAS AERUGINOSA LIGHT GROWTH  ENTEROCOCCUS FAECALIS LIGHT GROWTH PROTEUS MIRABILIS    Report Status 02/02/2015 FINAL  Final   Organism ID, Bacteria PSEUDOMONAS AERUGINOSA  Final   Organism ID, Bacteria ENTEROCOCCUS FAECALIS  Final   Organism ID, Bacteria PROTEUS MIRABILIS  Final      Susceptibility   Proteus mirabilis - MIC*    AMPICILLIN >=32 RESISTANT Resistant     CEFAZOLIN 8 SENSITIVE Sensitive     CEFTRIAXONE <=1 SENSITIVE Sensitive     CIPROFLOXACIN <=0.25 SENSITIVE Sensitive     GENTAMICIN >=16 RESISTANT Resistant     IMIPENEM 2 SENSITIVE Sensitive     TRIMETH/SULFA <=20 SENSITIVE Sensitive     CEFOXITIN <=4 SENSITIVE Sensitive     PIP/TAZO Value in next row Sensitive      SENSITIVE<=4    CEFTAZIDIME Value in next row Sensitive      SENSITIVE<=1    * LIGHT GROWTH PROTEUS MIRABILIS   Pseudomonas aeruginosa - MIC*    CEFTAZIDIME Value in next row Sensitive      SENSITIVE<=1    CIPROFLOXACIN Value in next row Sensitive      SENSITIVE<=1    GENTAMICIN Value in next row Sensitive  SENSITIVE<=1    IMIPENEM Value in next row Sensitive      SENSITIVE<=1    PIP/TAZO Value in next row Sensitive      SENSITIVE8    * MODERATE GROWTH PSEUDOMONAS AERUGINOSA   Enterococcus faecalis - MIC*    AMPICILLIN Value in next row Sensitive      SENSITIVE<=2    LINEZOLID Value in next row Sensitive      SENSITIVE2    * LIGHT GROWTH ENTEROCOCCUS FAECALIS  Blood culture (routine x 2)     Status: None (Preliminary result)   Collection Time: 02/17/15  2:30 AM  Result Value Ref Range Status   Specimen Description BLOOD LEFT WRIST  Final   Special Requests BOTTLES DRAWN AEROBIC AND ANAEROBIC  Final   Culture  Setup Time   Final    GRAM NEGATIVE RODS ANAEROBIC BOTTLE ONLY CRITICAL RESULT CALLED TO, READ BACK BY AND VERIFIED WITH: MICHELLE WILLIAMS AT 2026 02/17/15 SDR CONFIRMED BY SFW    Culture   Final    PROTEUS MIRABILIS ANAEROBIC BOTTLE ONLY COAGULASE NEGATIVE STAPHYLOCOCCUS CALL MICROBIOLOGY LAB IF  SENSITIVITIES ARE REQUIRED. AEROBIC BOTTLE ONLY POSSIBLE CONTAMINATION WITH SKIN FLORA    Report Status PENDING  Incomplete   Organism ID, Bacteria PROTEUS MIRABILIS  Final      Susceptibility   Proteus mirabilis - MIC*    AMPICILLIN >=32 RESISTANT Resistant     CEFTAZIDIME <=1 SENSITIVE Sensitive     CEFAZOLIN 8 SENSITIVE Sensitive     CEFTRIAXONE <=1 SENSITIVE Sensitive     CIPROFLOXACIN <=0.25 SENSITIVE Sensitive     GENTAMICIN >=16 RESISTANT Resistant     IMIPENEM 8 INTERMEDIATE Intermediate     TRIMETH/SULFA <=20 SENSITIVE Sensitive     * PROTEUS MIRABILIS  Blood culture (routine x 2)     Status: None (Preliminary result)   Collection Time: 02/17/15  2:53 AM  Result Value Ref Range Status   Specimen Description BLOOD LEFT HAND  Final   Special Requests BOTTLES DRAWN AEROBIC AND ANAEROBIC  Final   Culture  Setup Time   Final    GRAM NEGATIVE RODS AEROBIC BOTTLE ONLY CRITICAL RESULT CALLED TO, READ BACK BY AND VERIFIED WITH: MICHELLE WILLIAMS  02/18/15 BY AJO CONFIRMED BY BGB    Culture PROTEUS MIRABILIS AEROBIC BOTTLE ONLY   Final   Report Status PENDING  Incomplete   Organism ID, Bacteria PROTEUS MIRABILIS  Final      Susceptibility   Proteus mirabilis - MIC*    AMPICILLIN >=32 RESISTANT Resistant     CEFTAZIDIME <=1 SENSITIVE Sensitive     CEFAZOLIN 8 SENSITIVE Sensitive     CEFTRIAXONE <=1 SENSITIVE Sensitive     CIPROFLOXACIN <=0.25 SENSITIVE Sensitive     GENTAMICIN >=16 RESISTANT Resistant     IMIPENEM 2 SENSITIVE Sensitive     TRIMETH/SULFA <=20 SENSITIVE Sensitive     * PROTEUS MIRABILIS  MRSA PCR Screening     Status: Abnormal   Collection Time: 02/17/15  8:45 AM  Result Value Ref Range Status   MRSA by PCR POSITIVE (A) NEGATIVE Final    Comment:        The GeneXpert MRSA Assay (FDA approved for NASAL specimens only), is one component of a comprehensive MRSA colonization surveillance program. It is not intended to diagnose MRSA infection nor  to guide or monitor treatment for MRSA infections. CRITICAL RESULT CALLED TO, READ BACK BY AND VERIFIED WITH: STACY COLLIE @ [phone] 02/17/15 BGB   C difficile  quick scan w PCR reflex ([facility] only)     Status: None   Collection Time: 02/18/15 11:39 AM  Result Value Ref Range Status   C Diff antigen NEGATIVE  Final   C Diff toxin NEGATIVE  Final   C Diff interpretation Negative for C. difficile  Final    Medications:  Scheduled:  . amiodarone  200 mg Oral Daily  . Chlorhexidine Gluconate Cloth  6 each Topical Q0600  . ciprofloxacin  200 mg Intravenous Q12H  . collagenase   Topical Daily  . heparin  5,000 Units Subcutaneous 3 times per day  . magnesium sulfate 1 - 4 g bolus IVPB  4 g Intravenous Once  . mupirocin ointment  1 application Nasal BID  . pantoprazole  40 mg Oral QAC breakfast  . polyethylene glycol  17 g Oral Daily  . potassium chloride  40 mEq Oral Once  . tamsulosin  0.4 mg Oral Daily    Assessment: Potassium: 3.3, Mag: 1.6, Phos: 2.8, Ca: 7.6, albumin: 2.2, CCa: 9.0  Goal of Therapy:  Normalization of electrolytes  Plan:  Will order KCl 40 meq PO once.  Will recheck electrolytes in AM.   Ronald Good, PharmD Clinical Pharmacist 02/20/2015

## 2015-02-20 NOTE — Progress Notes (Signed)
[facility] [place] [facility]   PATIENT NAME: Ronald Good    MR#:  [id number]  DATE OF BIRTH:  15-Nov-1933  SUBJECTIVE:  CHIEF COMPLAINT:   Chief Complaint  Patient presents with  . Altered Mental Status   feels a little bit better. Complains of lower extremity pains, Dr. dew is planning angiogram tomorrow one leg at a time. Gangrenous feet are stable. Patient is off pressors. Kidney function has improved  REVIEW OF SYSTEMS:  Review of systems is not available. Pain in the legs  DRUG ALLERGIES:   Allergies  Allergen Reactions  . Oxycodone Other (See Comments)    "makes me loopy"  . Neomycin-Bacitracin Zn-Polymyx Rash    VITALS:  Blood pressure 92/62, pulse 96, temperature 98.5 F (36.9 C), temperature source Axillary, resp. rate 15, height  (1.88 m), weight 76.6 kg (168 lb 14 oz), SpO2 97 %.  PHYSICAL EXAMINATION:  GENERAL:  81 y.o.-year-old patient lying in the bed, more  responsive today is able to converse , following commands intermittently  EYES: Pupils equal, round, reactive to light and accommodation. No scleral icterus. Extraocular muscles intact.  HEENT: Head atraumatic, normocephalic. Oropharynx and nasopharynx clear.More moist mucosa. NECK:  Supple, no jugular venous distention. No thyroid enlargement, no tenderness.  LUNGS: Normal breath sounds bilaterally, no wheezing, rales,rhonchi or crepitation,better inspiratory  effort. No use of accessory muscles of respiration.  CARDIOVASCULAR: S1, S2 normal. No murmurs, rubs, or gallops.  ABDOMEN: Soft, nontender, nondistended. Bowel sounds present. No organomegaly or mass.  EXTREMITIES: trace pedal edema, open wounds were noted in the anterior shins in lower part, especially on the left with some necrotic areas of the toes and metacarpal phalangeal area. Extensive ulcerations in the anterior aspect of the right lower extremity. dressing is applied. No infection signs were noted. Presence  of necrosis on the right side as well . Peripheral pulses are nonpalpable.  Severe pain on manipulation of the patient's feet NEUROLOGIC: Cranial nerves II through XII are intact. Muscle strength 3/5 in all extremities. Sensation intact. Gait not checked.  PSYCHIATRIC: The patient is somnolent , confused.  SKIN: Sacral DU stage 2. Leg wound in dressing. Poor turgor.   LABORATORY PANEL:   CBC  Recent Labs Lab 02/19/15 0600  WBC 8.5  HGB 14.4  HCT 43.5  PLT 189   ------------------------------------------------------------------------------------------------------------------  Chemistries   Recent Labs Lab 02/17/15 0214  02/20/15 0510  NA 149*  < > 146*  K 4.7  < > 3.3*  CL 107  < > 110  CO2 29  < > 31  GLUCOSE 221*  < > 98  BUN 91*  < > 26*  CREATININE 3.20*  < > 0.99  CALCIUM 8.7*  < > 7.6*  MG  --   < > 1.6*  AST 131*  --   --   ALT 51  --   --   ALKPHOS 197*  --   --   BILITOT 2.0*  --   --   < > = values in this interval not displayed. ------------------------------------------------------------------------------------------------------------------  Cardiac Enzymes  Recent Labs Lab 02/17/15 0214  TROPONINI 0.03   ------------------------------------------------------------------------------------------------------------------  RADIOLOGY:  No results found.  EKG:   Orders placed or performed during the hospital encounter of 02/17/15  . EKG 12-Lead  . EKG 12-Lead    ASSESSMENT AND PLAN:   HAP and Proteus mirabilis septicemia. Of vancomycin  and zosyn,continuen IV,  remains afebrile and blood pressure has improved ,  white blood cell count is normal  Septic shock.  Tapered off levophed drip.  Holding lasix, toprol and metolazone.  ARF. Status post NS iv bolus. Kidney function has improved significantly .  Follow up BMP. Hold lasix, toprol and metolazone. Kidney US: no obstruction.   Hypernatremia. [id number] today. Continue  D5 water. Patient's urine  remains very dark and I'm concerned about intravascular depletion . Follow up BMP tomorrow in the morning.  Hypokalemia. , resolving with KCl in IV fluids. F/u BMP tomorrow.   Lactic acidosis. improved  Sacral decubitus ulcer stage 2 and  leg wound. Wound care appreciated.Appreciate vascular  surgery involvement , planning  Angiogram tomorrow.   AFib.Continue amiodarone and resume pradaxa after procedures  Chronic systolic CHF. Stable with IV fluid administration.     All the records are reviewed and case discussed with Care Management/Social Workerr. Management plans discussed with the patient, family and they are in agreement.  CODE STATUS: FULL CODE (I discussed with patient's daughter yesterday. Her sister is POA. Full code for now.)  TOTAL CRITICAL TIME TAKING CARE OF THIS PATIENT: 40 minutes.    Katharina CaperVAICKUTE,Jareth Pardee M.D on 02/20/2015 at 1:42 PM  Between 7am to 6pm - Pager - [phone]  After 6pm go to [email or web address] - password [email or web address]  [facility]  Office  [phone]  CC: Primary care physician; Marisue IvanLINTHAVONG, KANHKA, MD

## 2015-02-20 NOTE — Progress Notes (Signed)
MEDICATION RELATED CONSULT NOTE - INITIAL   Pharmacy Consult for Renal adjustment of antibiotics Indication: Renal dose adjustment  Allergies  Allergen Reactions  . Oxycodone Other (See Comments)    "makes me loopy"  . Neomycin-Bacitracin Zn-Polymyx Rash    Patient Measurements: Height: 6\' 2"  (188 cm) Weight: 168 lb 14 oz (76.6 kg) IBW/kg (Calculated) : 82.2  Vital Signs: Temp: 98.5 F (36.9 C) (07/10 0400) Temp Source: Axillary (07/10 0400) BP: 104/70 mmHg (07/10 0745) Pulse Rate: 96 (07/10 0745) Intake/Output from previous day: 07/09 0701 - 07/10 0700 In: 600 [IV Piggyback:600] Out: 2300 [Urine:2300] Intake/Output from this shift: Total I/O In: 75 [I.V.:75] Out: -   Labs:  Recent Labs  02/18/15 0638 02/19/15 0600 02/19/15 1521 02/20/15 0510  WBC 7.4 8.5  --   --   HGB 14.1 14.4  --   --   HCT 43.1 43.5  --   --   PLT 200 189  --   --   CREATININE 1.71* 1.30*  --  0.99  MG 2.1 1.9  --  1.6*  PHOS  --  2.4* 4.2 2.8   Estimated Creatinine Clearance: 66.6 mL/min (by C-G formula based on Cr of 0.99).   Microbiology: Recent Results (from the past 720 hour(s))  Culture, blood (routine x 2)     Status: None   Collection Time: 01/27/15  3:03 PM  Result Value Ref Range Status   Specimen Description BLOOD  Final   Special Requests Normal  Final   Culture NO GROWTH 5 DAYS  Final   Report Status 02/01/2015 FINAL  Final  Culture, blood (routine x 2)     Status: None   Collection Time: 01/27/15  3:03 PM  Result Value Ref Range Status   Specimen Description BLOOD  Final   Special Requests Normal  Final   Culture NO GROWTH 5 DAYS  Final   Report Status 02/01/2015 FINAL  Final  Wound culture     Status: None   Collection Time: 01/28/15  1:05 PM  Result Value Ref Range Status   Specimen Description LEG  Final   Special Requests Normal  Final   Gram Stain NO WBC SEEN RARE GRAM NEGATIVE RODS   Final   Culture   Final    MODERATE GROWTH PSEUDOMONAS  AERUGINOSA LIGHT GROWTH ENTEROCOCCUS FAECALIS LIGHT GROWTH PROTEUS MIRABILIS    Report Status 02/02/2015 FINAL  Final   Organism ID, Bacteria PSEUDOMONAS AERUGINOSA  Final   Organism ID, Bacteria ENTEROCOCCUS FAECALIS  Final   Organism ID, Bacteria PROTEUS MIRABILIS  Final      Susceptibility   Proteus mirabilis - MIC*    AMPICILLIN >=32 RESISTANT Resistant     CEFAZOLIN 8 SENSITIVE Sensitive     CEFTRIAXONE <=1 SENSITIVE Sensitive     CIPROFLOXACIN <=0.25 SENSITIVE Sensitive     GENTAMICIN >=16 RESISTANT Resistant     IMIPENEM 2 SENSITIVE Sensitive     TRIMETH/SULFA <=20 SENSITIVE Sensitive     CEFOXITIN <=4 SENSITIVE Sensitive     PIP/TAZO Value in next row Sensitive      SENSITIVE<=4    CEFTAZIDIME Value in next row Sensitive      SENSITIVE<=1    * LIGHT GROWTH PROTEUS MIRABILIS   Pseudomonas aeruginosa - MIC*    CEFTAZIDIME Value in next row Sensitive      SENSITIVE<=1    CIPROFLOXACIN Value in next row Sensitive      SENSITIVE<=1    GENTAMICIN Value in next row Sensitive  SENSITIVE<=1    IMIPENEM Value in next row Sensitive      SENSITIVE<=1    PIP/TAZO Value in next row Sensitive      SENSITIVE8    * MODERATE GROWTH PSEUDOMONAS AERUGINOSA   Enterococcus faecalis - MIC*    AMPICILLIN Value in next row Sensitive      SENSITIVE<=2    LINEZOLID Value in next row Sensitive      SENSITIVE2    * LIGHT GROWTH ENTEROCOCCUS FAECALIS  Blood culture (routine x 2)     Status: None (Preliminary result)   Collection Time: 02/17/15  2:30 AM  Result Value Ref Range Status   Specimen Description BLOOD LEFT WRIST  Final   Special Requests BOTTLES DRAWN AEROBIC AND ANAEROBIC  Final   Culture  Setup Time   Final    GRAM NEGATIVE RODS ANAEROBIC BOTTLE ONLY CRITICAL RESULT CALLED TO, READ BACK BY AND VERIFIED WITH: MICHELLE WILLIAMS AT 2026 02/17/15 SDR CONFIRMED BY SFW    Culture   Final    PROTEUS MIRABILIS ANAEROBIC BOTTLE ONLY COAGULASE NEGATIVE STAPHYLOCOCCUS CALL  MICROBIOLOGY LAB IF SENSITIVITIES ARE REQUIRED. AEROBIC BOTTLE ONLY POSSIBLE CONTAMINATION WITH SKIN FLORA    Report Status PENDING  Incomplete   Organism ID, Bacteria PROTEUS MIRABILIS  Final      Susceptibility   Proteus mirabilis - MIC*    AMPICILLIN >=32 RESISTANT Resistant     CEFTAZIDIME <=1 SENSITIVE Sensitive     CEFAZOLIN 8 SENSITIVE Sensitive     CEFTRIAXONE <=1 SENSITIVE Sensitive     CIPROFLOXACIN <=0.25 SENSITIVE Sensitive     GENTAMICIN >=16 RESISTANT Resistant     IMIPENEM 8 INTERMEDIATE Intermediate     TRIMETH/SULFA <=20 SENSITIVE Sensitive     * PROTEUS MIRABILIS  Blood culture (routine x 2)     Status: None (Preliminary result)   Collection Time: 02/17/15  2:53 AM  Result Value Ref Range Status   Specimen Description BLOOD LEFT HAND  Final   Special Requests BOTTLES DRAWN AEROBIC AND ANAEROBIC  Final   Culture  Setup Time   Final    GRAM NEGATIVE RODS AEROBIC BOTTLE ONLY CRITICAL RESULT CALLED TO, READ BACK BY AND VERIFIED WITH: MICHELLE WILLIAMS @0451  02/18/15 BY AJO CONFIRMED BY BGB    Culture PROTEUS MIRABILIS AEROBIC BOTTLE ONLY   Final   Report Status PENDING  Incomplete   Organism ID, Bacteria PROTEUS MIRABILIS  Final      Susceptibility   Proteus mirabilis - MIC*    AMPICILLIN >=32 RESISTANT Resistant     CEFTAZIDIME <=1 SENSITIVE Sensitive     CEFAZOLIN 8 SENSITIVE Sensitive     CEFTRIAXONE <=1 SENSITIVE Sensitive     CIPROFLOXACIN <=0.25 SENSITIVE Sensitive     GENTAMICIN >=16 RESISTANT Resistant     IMIPENEM 2 SENSITIVE Sensitive     TRIMETH/SULFA <=20 SENSITIVE Sensitive     * PROTEUS MIRABILIS  MRSA PCR Screening     Status: Abnormal   Collection Time: 02/17/15  8:45 AM  Result Value Ref Range Status   MRSA by PCR POSITIVE (A) NEGATIVE Final    Comment:        The GeneXpert MRSA Assay (FDA approved for NASAL specimens only), is one component of a comprehensive MRSA colonization surveillance program. It is not intended to diagnose  MRSA infection nor to guide or monitor treatment for MRSA infections. CRITICAL RESULT CALLED TO, READ BACK BY AND VERIFIED WITH: STACY COLLIE @ [phone] 02/17/15 BGB   C difficile  quick scan w PCR reflex ([facility] only)     Status: None   Collection Time: 02/18/15 11:39 AM  Result Value Ref Range Status   C Diff antigen NEGATIVE  Final   C Diff toxin NEGATIVE  Final   C Diff interpretation Negative for C. difficile  Final    Medical History: Past Medical History  Diagnosis Date  . Chronic systolic congestive heart failure   . BPH (benign prostatic hyperplasia)   . Atrial fibrillation   . Essential hypertension   . Gastroesophageal reflux disease     Medications:  Scheduled:  . amiodarone  200 mg Oral Daily  . Chlorhexidine Gluconate Cloth  6 each Topical Q0600  . ciprofloxacin  400 mg Intravenous Q12H  . collagenase   Topical Daily  . heparin  5,000 Units Subcutaneous 3 times per day  . magnesium sulfate 1 - 4 g bolus IVPB  4 g Intravenous Once  . mupirocin ointment  1 application Nasal BID  . pantoprazole  40 mg Oral QAC breakfast  . polyethylene glycol  17 g Oral Daily  . potassium chloride  40 mEq Oral Once  . tamsulosin  0.4 mg Oral Daily   Infusions:  . dextrose 75 mL/hr at 02/20/15 0352  . norepinephrine (LEVOPHED) Adult infusion Stopped (02/20/15 0749)    Assessment: Patient is a 81 yo male admitted for pneumonia.  Patient previously on Zosyn and Vancomycin for empiric treatment.  MD initiated Ciprofloxacin 200 mg IV q12h for treatment of Proteus Mirabilis bacteremia.  Pharmacy consulted to renally adjust antibiotics as patient admitted with ARF.   SCr: 0.99, est CrCl~67 mL/min Goal of Therapy:  Adjustment of antibiotics for renal function  Plan:  Will transition patient to Ciprofloxacin 400 mg IV q12h as renal function has improved.   Pharmacy will continue to follow.     Clarisa Schools, PharmD Clinical Pharmacist 02/20/2015

## 2015-02-20 NOTE — Progress Notes (Signed)
Patient with BP trending down since 13:00. Dr. Winona LegatoVaickute called and notified, telephone order obtained.

## 2015-02-20 NOTE — Progress Notes (Signed)
[facility]  Daily Progress Note   Subjective  - * No surgery date entered *  Patient more alert and awake. Off pressors. Renal function has improved. Clinically seems to be quite a bit better. Ulcerations and gangrenous changes to both feet stable.  Objective Filed Vitals:   02/20/15 0500 02/20/15 0600 02/20/15 0700 02/20/15 0745  BP: 107/94 118/76 111/68 104/70  Pulse: 88 93 96 96  Temp:      TempSrc:      Resp: 16 18 15 14   Height:      Weight:      SpO2: 98% 99% 100% 98%    Intake/Output Summary (Last 24 hours) at 02/20/15 1034 Last data filed at 02/20/15 0800  Gross per 24 hour  Intake    675 ml  Output   2300 ml  Net  -1625 ml    PULM  CTAB CV  RRR VASC  ulcerations and gangrenous changes to feet stable.  Laboratory CBC    Component Value Date/Time   WBC 8.5 02/19/2015 0600   WBC 9.2 07/31/2014 0611   HGB 14.4 02/19/2015 0600   HGB 12.4* 07/31/2014 0611   HCT 43.5 02/19/2015 0600   HCT 37.2* 07/31/2014 0611   PLT 189 02/19/2015 0600   PLT 153 07/31/2014 0611    BMET    Component Value Date/Time   NA 146* 02/20/2015 0510   NA 138 08/02/2014 0424   K 3.3* 02/20/2015 0510   K 3.6 08/02/2014 0424   CL 110 02/20/2015 0510   CL 103 08/02/2014 0424   CO2 31 02/20/2015 0510   CO2 28 08/02/2014 0424   GLUCOSE 98 02/20/2015 0510   GLUCOSE 89 08/02/2014 0424   BUN 26* 02/20/2015 0510   BUN 10 08/02/2014 0424   CREATININE 0.99 02/20/2015 0510   CREATININE 0.90 08/02/2014 0424   CALCIUM 7.6* 02/20/2015 0510   CALCIUM 8.3* 08/02/2014 0424   GFRNONAA >60 02/20/2015 0510   GFRNONAA >60 03/24/2014 0532   GFRAA >60 02/20/2015 0510   GFRAA >60 03/24/2014 0532    Assessment/Planning: Multiple issues.   Sepsis. Improving with antibiotics.  Acute renal failure. Creatinine has returned to baseline.  Bilateral lower extremity ulcerations and gangrenous changes. We'll plan angiogram tomorrow if primary service feels this is safe and  appropriate. We'll only be able to do one leg at a time, and we'll plan to look at the other leg within the next week if appropriate.  Discussed with daughter who seems agreeable with our plan of care.    DEW,JASON  02/20/2015, 10:34 AM

## 2015-02-20 NOTE — Progress Notes (Signed)
ANTIBIOTIC CONSULT NOTE - INITIAL  Pharmacy Consult for Vancomycin Indication: Wound Infection and decreasing BP  Allergies  Allergen Reactions  . Oxycodone Other (See Comments)    "makes me loopy"  . Neomycin-Bacitracin Zn-Polymyx Rash    Patient Measurements: Height:  (188 cm) Weight: 168 lb 14 oz (76.6 kg) IBW/kg (Calculated) : 82.2 Adjusted Body Weight: n/a  Vital Signs: BP: 81/64 mmHg (07/10 1500) Pulse Rate: 85 (07/10 1500) Intake/Output from previous day: 07/09 0701 - 07/10 0700 In: 600 [IV Piggyback:600] Out: 2300 [Urine:2300] Intake/Output from this shift: Total I/O In: 775 [I.V.:475; IV Piggyback:300] Out: 475 [Urine:475]  Labs:  Recent Labs  02/18/15 0638 02/19/15 0600 02/20/15 0510  WBC 7.4 8.5  --   HGB 14.1 14.4  --   PLT 200 189  --   CREATININE 1.71* 1.30* 0.99   Estimated Creatinine Clearance: 66.6 mL/min (by C-G formula based on Cr of 0.99). No results for input(s): VANCOTROUGH, VANCOPEAK, VANCORANDOM, GENTTROUGH, GENTPEAK, GENTRANDOM, TOBRATROUGH, TOBRAPEAK, TOBRARND, AMIKACINPEAK, AMIKACINTROU, AMIKACIN in the last 72 hours.   Microbiology: Recent Results (from the past 720 hour(s))  Culture, blood (routine x 2)     Status: None   Collection Time: 01/27/15  3:03 PM  Result Value Ref Range Status   Specimen Description BLOOD  Final   Special Requests Normal  Final   Culture NO GROWTH 5 DAYS  Final   Report Status 02/01/2015 FINAL  Final  Culture, blood (routine x 2)     Status: None   Collection Time: 01/27/15  3:03 PM  Result Value Ref Range Status   Specimen Description BLOOD  Final   Special Requests Normal  Final   Culture NO GROWTH 5 DAYS  Final   Report Status 02/01/2015 FINAL  Final  Wound culture     Status: None   Collection Time: 01/28/15  1:05 PM  Result Value Ref Range Status   Specimen Description LEG  Final   Special Requests Normal  Final   Gram Stain NO WBC SEEN RARE GRAM NEGATIVE RODS   Final   Culture    Final    MODERATE GROWTH PSEUDOMONAS AERUGINOSA LIGHT GROWTH ENTEROCOCCUS FAECALIS LIGHT GROWTH PROTEUS MIRABILIS    Report Status 02/02/2015 FINAL  Final   Organism ID, Bacteria PSEUDOMONAS AERUGINOSA  Final   Organism ID, Bacteria ENTEROCOCCUS FAECALIS  Final   Organism ID, Bacteria PROTEUS MIRABILIS  Final      Susceptibility   Proteus mirabilis - MIC*    AMPICILLIN >=32 RESISTANT Resistant     CEFAZOLIN 8 SENSITIVE Sensitive     CEFTRIAXONE <=1 SENSITIVE Sensitive     CIPROFLOXACIN <=0.25 SENSITIVE Sensitive     GENTAMICIN >=16 RESISTANT Resistant     IMIPENEM 2 SENSITIVE Sensitive     TRIMETH/SULFA <=20 SENSITIVE Sensitive     CEFOXITIN <=4 SENSITIVE Sensitive     PIP/TAZO Value in next row Sensitive      SENSITIVE<=4    CEFTAZIDIME Value in next row Sensitive      SENSITIVE<=1    * LIGHT GROWTH PROTEUS MIRABILIS   Pseudomonas aeruginosa - MIC*    CEFTAZIDIME Value in next row Sensitive      SENSITIVE<=1    CIPROFLOXACIN Value in next row Sensitive      SENSITIVE<=1    GENTAMICIN Value in next row Sensitive      SENSITIVE<=1    IMIPENEM Value in next row Sensitive      SENSITIVE<=1    PIP/TAZO Value in next  row Sensitive      SENSITIVE8    * MODERATE GROWTH PSEUDOMONAS AERUGINOSA   Enterococcus faecalis - MIC*    AMPICILLIN Value in next row Sensitive      SENSITIVE<=2    LINEZOLID Value in next row Sensitive      SENSITIVE2    * LIGHT GROWTH ENTEROCOCCUS FAECALIS  Blood culture (routine x 2)     Status: None (Preliminary result)   Collection Time: 02/17/15  2:30 AM  Result Value Ref Range Status   Specimen Description BLOOD LEFT WRIST  Final   Special Requests BOTTLES DRAWN AEROBIC AND ANAEROBIC 7ML  Final   Culture  Setup Time   Final    GRAM NEGATIVE RODS ANAEROBIC BOTTLE ONLY CRITICAL RESULT CALLED TO, READ BACK BY AND VERIFIED WITH: MICHELLE WILLIAMS AT 2026 02/17/15 SDR CONFIRMED BY SFW    Culture   Final    PROTEUS MIRABILIS ANAEROBIC BOTTLE  ONLY COAGULASE NEGATIVE STAPHYLOCOCCUS CALL MICROBIOLOGY LAB IF SENSITIVITIES ARE REQUIRED. AEROBIC BOTTLE ONLY POSSIBLE CONTAMINATION WITH SKIN FLORA    Report Status PENDING  Incomplete   Organism ID, Bacteria PROTEUS MIRABILIS  Final      Susceptibility   Proteus mirabilis - MIC*    AMPICILLIN >=32 RESISTANT Resistant     CEFTAZIDIME <=1 SENSITIVE Sensitive     CEFAZOLIN 8 SENSITIVE Sensitive     CEFTRIAXONE <=1 SENSITIVE Sensitive     CIPROFLOXACIN <=0.25 SENSITIVE Sensitive     GENTAMICIN >=16 RESISTANT Resistant     IMIPENEM 8 INTERMEDIATE Intermediate     TRIMETH/SULFA <=20 SENSITIVE Sensitive     * PROTEUS MIRABILIS  Blood culture (routine x 2)     Status: None (Preliminary result)   Collection Time: 02/17/15  2:53 AM  Result Value Ref Range Status   Specimen Description BLOOD LEFT HAND  Final   Special Requests BOTTLES DRAWN AEROBIC AND ANAEROBIC 7ML  Final   Culture  Setup Time   Final    GRAM NEGATIVE RODS AEROBIC BOTTLE ONLY CRITICAL RESULT CALLED TO, READ BACK BY AND VERIFIED WITH: MICHELLE WILLIAMS @0451  02/18/15 BY AJO CONFIRMED BY BGB    Culture PROTEUS MIRABILIS AEROBIC BOTTLE ONLY   Final   Report Status PENDING  Incomplete   Organism ID, Bacteria PROTEUS MIRABILIS  Final      Susceptibility   Proteus mirabilis - MIC*    AMPICILLIN >=32 RESISTANT Resistant     CEFTAZIDIME <=1 SENSITIVE Sensitive     CEFAZOLIN 8 SENSITIVE Sensitive     CEFTRIAXONE <=1 SENSITIVE Sensitive     CIPROFLOXACIN <=0.25 SENSITIVE Sensitive     GENTAMICIN >=16 RESISTANT Resistant     IMIPENEM 2 SENSITIVE Sensitive     TRIMETH/SULFA <=20 SENSITIVE Sensitive     * PROTEUS MIRABILIS  MRSA PCR Screening     Status: Abnormal   Collection Time: 02/17/15  8:45 AM  Result Value Ref Range Status   MRSA by PCR POSITIVE (A) NEGATIVE Final    Comment:        The GeneXpert MRSA Assay (FDA approved for NASAL specimens only), is one component of a comprehensive MRSA  colonization surveillance program. It is not intended to diagnose MRSA infection nor to guide or monitor treatment for MRSA infections. CRITICAL RESULT CALLED TO, READ BACK BY AND VERIFIED WITH: STACY COLLIE @ [phone] 02/17/15 B[facility]   C difficile quick scan w PCR reflex ([facility] only)     Status: None   Collection Time: 02/18/15 11:39 AM  Result Value  Ref Range Status   C Diff antigen NEGATIVE  Final   C Diff toxin NEGATIVE  Final   C Diff interpretation Negative for C. difficile  Final    Medical History: Past Medical History  Diagnosis Date  . Chronic systolic congestive heart failure   . BPH (benign prostatic hyperplasia)   . Atrial fibrillation   . Essential hypertension   . Gastroesophageal reflux disease     Medications:  Anti-infectives    Start     Dose/Rate Route Frequency Ordered Stop   02/20/15 1700  vancomycin (VANCOCIN) IVPB 1000 mg/200 mL premix     1,000 mg 200 mL/hr over 60 Minutes Intravenous Every 12 hours 02/20/15 1558     02/20/15 1300  ciprofloxacin (CIPRO) IVPB 400 mg     400 mg 200 mL/hr over 60 Minutes Intravenous Every 12 hours 02/20/15 1051     02/19/15 2000  vancomycin (VANCOCIN) IVPB 750 mg/150 ml premix  Status:  Discontinued     750 mg 150 mL/hr over 60 Minutes Intravenous Every 12 hours 02/19/15 1137 02/19/15 1304   02/19/15 1315  ciprofloxacin (CIPRO) IVPB 200 mg  Status:  Discontinued     200 mg 100 mL/hr over 60 Minutes Intravenous Every 12 hours 02/19/15 1304 02/20/15 1051   02/18/15 1400  vancomycin (VANCOCIN) IVPB 750 mg/150 ml premix  Status:  Discontinued     750 mg 150 mL/hr over 60 Minutes Intravenous Every 18 hours 02/18/15 0827 02/19/15 1137   02/17/15 2000  vancomycin (VANCOCIN) IVPB 1000 mg/200 mL premix  Status:  Discontinued     1,000 mg 200 mL/hr over 60 Minutes Intravenous Every 36 hours 02/17/15 0914 02/18/15 0827   02/17/15 1600  piperacillin-tazobactam (ZOSYN) IVPB 3.375 g  Status:  Discontinued     3.375 g 12.5 mL/hr over  240 Minutes Intravenous 3 times per day 02/17/15 0848 02/19/15 1304   02/17/15 0530  levofloxacin (LEVAQUIN) IVPB 750 mg     750 mg 100 mL/hr over 90 Minutes Intravenous  Once 02/17/15 0521 02/17/15 0759   02/17/15 0530  piperacillin-tazobactam (ZOSYN) IVPB 3.375 g     3.375 g 12.5 mL/hr over 240 Minutes Intravenous  Once 02/17/15 0521 02/17/15 0759   02/17/15 0530  vancomycin (VANCOCIN) IVPB 1000 mg/200 mL premix     1,000 mg 200 mL/hr over 60 Minutes Intravenous  Once 02/17/15 0521 02/17/15 0900     Assessment: Patient with declining condition. Currently ordered Cipro for Proteus infection. RN reported BP trending down and MD has ordered Vancomycin to be added.  Vancomycin PK parameters: Est.CrCl=66.6 ml/min, Ke=0.06, t1/2=11.6hr, Vd=53.6L  Goal of Therapy:  Vancomycin trough level 15-20 mcg/ml  Plan:  Measure antibiotic drug levels at steady state Follow up culture results Will order Vancomycin 1g IV q12h. Check a Vancomycin trough prior to 5th dose.  Clovia Cuff, PharmD, BCPS 02/20/2015 4:14 PM

## 2015-02-21 ENCOUNTER — Encounter
Admission: EM | Disposition: A | Discharge status: Hospice | Payer: Self-pay | Source: Home / Self Care | Attending: Internal Medicine

## 2015-02-21 HISTORY — PX: PERIPHERAL VASCULAR CATHETERIZATION: SHX172C

## 2015-02-21 LAB — BASIC METABOLIC PANEL
ANION GAP: 6 (ref 5–15)
BUN: 19 mg/dL (ref 6–20)
CHLORIDE: 109 mmol/L (ref 101–111)
CO2: 28 mmol/L (ref 22–32)
Calcium: 7.4 mg/dL — ABNORMAL LOW (ref 8.9–10.3)
Creatinine, Ser: 0.82 mg/dL (ref 0.61–1.24)
GFR calc Af Amer: >60 mL/min (ref >60)
GLUCOSE: 88 mg/dL (ref 65–99)
POTASSIUM: 3.5 mmol/L (ref 3.5–5.1)
Sodium: 143 mmol/L (ref 135–145)

## 2015-02-21 LAB — CBC
HCT: 40 % (ref 40.0–52.0)
Hemoglobin: 13.1 g/dL (ref 13.0–18.0)
MCH: 32.6 pg (ref 26.0–34.0)
MCHC: 32.6 g/dL (ref 32.0–36.0)
MCV: 100 fL (ref 80.0–100.0)
Platelets: 141 10*3/uL — ABNORMAL LOW (ref 150–440)
RBC: 4 MIL/uL — ABNORMAL LOW (ref 4.40–5.90)
RDW: 16.8 % — ABNORMAL HIGH (ref 11.5–14.5)
WBC: 9.3 10*3/uL (ref 3.8–10.6)

## 2015-02-21 LAB — PHOSPHORUS: Phosphorus: 2.5 mg/dL (ref 2.5–4.6)

## 2015-02-21 LAB — MAGNESIUM: Magnesium: 1.9 mg/dL (ref 1.7–2.4)

## 2015-02-21 SURGERY — LOWER EXTREMITY ANGIOGRAPHY
Anesthesia: Moderate Sedation | Laterality: Left

## 2015-02-21 MED ORDER — HEPARIN SODIUM (PORCINE) 1000 UNIT/ML IJ SOLN
INTRAMUSCULAR | Status: AC
  Filled 2015-02-21: qty 1

## 2015-02-21 MED ORDER — CEFAZOLIN SODIUM 1-5 GM-% IV SOLN
INTRAVENOUS | Status: AC
Start: 2015-02-21 — End: 2015-02-21
  Filled 2015-02-21: qty 50

## 2015-02-21 MED ORDER — SODIUM CHLORIDE 0.9 % IV BOLUS (SEPSIS)
500.0000 mL | Freq: Once | INTRAVENOUS | Status: AC
  Administered 2015-02-21: 500 mL via INTRAVENOUS

## 2015-02-21 MED ORDER — HEPARIN SODIUM (PORCINE) 1000 UNIT/ML IJ SOLN
INTRAMUSCULAR | Status: DC | PRN
  Administered 2015-02-21: 4000 [IU] via INTRAVENOUS

## 2015-02-21 MED ORDER — FENTANYL CITRATE (PF) 100 MCG/2ML IJ SOLN
INTRAMUSCULAR | Status: AC
  Filled 2015-02-21: qty 2

## 2015-02-21 MED ORDER — SODIUM CHLORIDE 0.9 % IV SOLN: INTRAVENOUS | Status: DC

## 2015-02-21 MED ORDER — MIDAZOLAM HCL 2 MG/2ML IJ SOLN
INTRAMUSCULAR | Status: DC | PRN
  Administered 2015-02-21: 1 mg via INTRAVENOUS
  Administered 2015-02-21: 2 mg via INTRAVENOUS
  Administered 2015-02-21 (×2): 1 mg via INTRAVENOUS

## 2015-02-21 MED ORDER — FLUMAZENIL 0.5 MG/5ML IV SOLN
0.4000 mg | Freq: Once | INTRAVENOUS | Status: AC
  Filled 2015-02-21: qty 5

## 2015-02-21 MED ORDER — MIDAZOLAM HCL 5 MG/5ML IJ SOLN
INTRAMUSCULAR | Status: AC
  Filled 2015-02-21: qty 5

## 2015-02-21 MED ORDER — NALOXONE HCL 1 MG/ML IJ SOLN
INTRAMUSCULAR | Status: AC
  Filled 2015-02-21: qty 2

## 2015-02-21 MED ORDER — LIDOCAINE-EPINEPHRINE (PF) 1 %-1:200000 IJ SOLN
INTRAMUSCULAR | Status: AC
  Filled 2015-02-21: qty 30

## 2015-02-21 MED ORDER — HEPARIN (PORCINE) IN NACL 2-0.9 UNIT/ML-% IJ SOLN
INTRAMUSCULAR | Status: AC
  Filled 2015-02-21: qty 1000

## 2015-02-21 MED ORDER — FENTANYL CITRATE (PF) 100 MCG/2ML IJ SOLN
INTRAMUSCULAR | Status: DC | PRN
  Administered 2015-02-21: 50 ug via INTRAVENOUS
  Administered 2015-02-21 (×2): 25 ug via INTRAVENOUS

## 2015-02-21 MED ORDER — FLUMAZENIL 0.5 MG/5ML IV SOLN
INTRAVENOUS | Status: AC
  Administered 2015-02-21: 0.4 mg via INTRAVENOUS
  Filled 2015-02-21: qty 5

## 2015-02-21 SURGICAL SUPPLY — 31 items
BALLN ARMADA 2.5X100X150 (BALLOONS) ×3
BALLN ARMADA 35LL 5X200X135 (BALLOONS) ×3
BALLN DORADO 5X200X135 (BALLOONS) ×3
BALLN LUTONIX 5X150X130 (BALLOONS) ×3
BALLN ULTRVRSE 2X20X150 (BALLOONS) ×3
BALLN ULTRVRSE 3X300X150 (BALLOONS) ×3
BALLN ULTRVRSE 3X300X150 OTW (BALLOONS) ×1
BALLOON ARMADA 2.5X100X150 (BALLOONS) ×1 IMPLANT
BALLOON ARMADA 35LL 5X200X135 (BALLOONS) ×1 IMPLANT
BALLOON DORADO 5X200X135 (BALLOONS) ×1 IMPLANT
BALLOON LUTONIX 5X150X130 (BALLOONS) ×1 IMPLANT
BALLOON ULTRVRSE 2X20X150 (BALLOONS) ×1 IMPLANT
BALLOON ULTRVRSE 3X300X150 OTW (BALLOONS) ×1 IMPLANT
CANNULA NASAL 8 HUDSON (TUBING) ×3 IMPLANT
CATH CXI SUPP ANG 4FR 135 (MICROCATHETER) ×1 IMPLANT
CATH CXI SUPP ANG 4FR 135CM (MICROCATHETER) ×3
CATH CXI SUPP ST 4FR 135CM (MICROCATHETER) ×3 IMPLANT
CATH KMP 5.0X100 (CATHETERS) ×3 IMPLANT
CATH ROYAL FLUSH PIG 5F 70CM (CATHETERS) ×3 IMPLANT
DEVICE INFLATION 20/30 (MISCELLANEOUS) ×3 IMPLANT
DEVICE STARCLOSE SE CLOSURE (Vascular Products) ×2 IMPLANT
GLIDEWIRE ADV .035X260CM (WIRE) ×3 IMPLANT
GUIDEWIRE PFTE-COATED .018X300 (WIRE) ×3 IMPLANT
PACK ANGIOGRAPHY (CUSTOM PROCEDURE TRAY) ×3 IMPLANT
SHEATH ANL2 6FRX45 HC (SHEATH) ×3 IMPLANT
SHEATH BRITE TIP 5FRX11 (SHEATH) ×3 IMPLANT
STENT VIABAHN 6X250X120 (Permanent Stent) ×2 IMPLANT
SYR MEDRAD MARK V 150ML (SYRINGE) ×3 IMPLANT
TUBING CONTRAST HIGH PRESS 72 (TUBING) ×3 IMPLANT
WIRE G V18X300CM (WIRE) ×3 IMPLANT
WIRE J 3MM .035X145CM (WIRE) ×3 IMPLANT

## 2015-02-21 NOTE — Progress Notes (Signed)
Nutrition Follow-up  DOCUMENTATION CODES:  Severe malnutrition in context of acute illness/injury  INTERVENTION:   Medical Food Supplement Therapy: continue Honey Thick MightyShakes TID, Magic Cup BID Meals and Snacks: Cater to patient preferences   NUTRITION DIAGNOSIS:  Inadequate oral intake related to acute illness as evidenced by per patient/family report. Continues  GOAL:  Patient will meet greater than or equal to 90% of their needs  MONITOR:  PO intake (Energy Intake, Anthropometrics, Digestive System, Electrolyte/Renal Profile)   ASSESSMENT:  Pt for angiogram today of LE, off pressors   Diet Order: diet downgraded to Dysphagia I, Honey Thick  Current Nutrition: no recorded po intake since previous admission; per nsg documentation, pt with poor appetite, eating bites/sips  Medications: NS with KCl at 50 ml/hr  Electrolyte/Renal Profile and Glucose Profile:   Recent Labs Lab 02/19/15 0600 02/19/15 1521 02/20/15 0510 02/21/15 0550  NA 153*  --  146* 143  K 2.9* 2.9* 3.3* 3.5  CL 117*  --  110 109  CO2 29  --  31 28  BUN 46*  --  26* 19  CREATININE 1.30*  --  0.99 0.82  CALCIUM 8.4*  --  7.6* 7.4*  MG 1.9  --  1.6* 1.9  PHOS 2.4* 4.2 2.8 2.5  GLUCOSE 129*  --  98 88   Protein Profile:  Recent Labs Lab 02/17/15 0214  ALBUMIN 2.2*    Last BM: 7/9   Anthropometrics:   Height:  Ht Readings from Last 1 Encounters:  02/17/15 6\' 2"  (1.88 m)    Weight:  Wt Readings from Last 1 Encounters:  02/19/15 168 lb 14 oz (76.6 kg)    Ideal Body Weight:     Wt Readings from Last 10 Encounters:  02/19/15 168 lb 14 oz (76.6 kg)  02/03/15 177 lb 1.6 oz (80.332 kg)    BMI:  Body mass index is 21.67 kg/(m^2).  Estimated Nutritional Needs:  Kcal:  [id number] kcals (BEE 1546, 1.2 AF, 1.1-1.3 IF) using current wt of 76 kg  Protein:  91-114 g (1.2-1.5 g/kg)   Fluid:  2280-2660 mL (30-35 ml/kg)    HIGH Care Level  Romelle Starcherate Kenson Groh MS, RD,  LDN [phone] Pager

## 2015-02-21 NOTE — Op Note (Signed)
[facility] Percutaneous Study/Intervention Procedural Note   Date of Surgery: 02/17/2015 - 02/21/2015  Surgeon(s):DEW,JASON   Assistants:none  Pre-operative Diagnosis: PAD with ulceration and gangrene bilateral lower extremities  Post-operative diagnosis: Same  Procedure(s) Performed: 1. Ultrasound guidance for vascular access right femoral artery 2. Catheter placement into left posterior tibial artery from right femoral approach 3. Aortogram and selective left lower extremity angiogram 4. Percutaneous transluminal angioplasty of left posterior tibial artery and tibioperoneal trunk with 2.5 and 3 mm diameter angioplasty balloons 5. Percutaneous transluminal angioplasty of the left superficial femoral artery and popliteal artery with 5 mm diameter conventional and drug-coated angioplasty balloons  6.  Viabahn covered stent placement to the left SFA and popliteal artery with a 6 mm diameter by 25 cm length stent 7. StarClose closure device right femoral artery  EBL: 25 cc  Indications: Patient is a 81 year old white male with severe ulcerations of both lower extremities and gangrenous changes of both feet. The patient has 2 recent admissions for ulcerations and a recent admission for sepsis. The patient is brought in for angiography for further evaluation and potential treatment. Risks and benefits are discussed and informed consent is obtained  Procedure: The patient was identified and appropriate procedural time out was performed. The patient was then placed supine on the table and prepped and draped in the usual sterile fashion. Ultrasound was used to evaluate the right common femoral artery. It was patent . A digital ultrasound image was acquired. A Seldinger needle was used to access the right common femoral artery under direct ultrasound guidance and a permanent  image was performed. A 0.035 J wire was advanced without resistance and a 5Fr sheath was placed over a stiff wire. Pigtail catheter was placed into the aorta and an AP aortogram was performed. This demonstrated a patent stent graft which was an Endologix stent graft. The renal arteries were not particularly well opacified on this injection. I then crossed the aortic bifurcation and advanced into the proximal left superficial femoral artery. Selective left lower extremity angiogram was then performed. This demonstrated a diffusely diseased SFA and popliteal artery with a medium length SFA occlusion.  There was then a napkin ring like lesion in the TP trunk just beyond the origin of the AT artery.  The PT and peroneal arteries were then open distally. The patient was systemically heparinized and a 6 [place] sheath was then placed over the [employer]vant[employer] wire. I then used a Kumpe catheter and the advantage wire to navigate through the SFA occlusion.  The severe, calcified lesion did not let the Kumpe catheter track easily, and we exchanged for a CXI catheter. With this, I was able to navigate into the tibioperoneal trunk and cross the high-grade stenosis at the origin of the tibioperoneal artery. I exchanged initially for a 0.018V 18 wire but to get more support we exchanged for the 0.018 advantage wire. We had to predilate the SFA and popliteal lesion with a 3 mm balloon. I then treated the tibioperoneal trunk and proximal posterior tibial arteries were parked the wire with a 2.5 mm diameter angioplasty balloon and a 3 mm diameter angioplasty balloon. This resulted in markedly improved flow at the tibial level with less than 20% residual stenosis. I then turned my attention to the SFA and popliteal disease. A 5 mm diameter by 15 cm length Lutonix drug-coated angioplasty balloon was brought down but would not cross the occlusion in its entirety. I treated the proximal portion of the  occlusion and the diseased  proximal SFA with this balloon. I then used a 0.018 balloon for a lower profile to get across the mid to distal SFA and into the popliteal artery. The balloon was inflated encompassing these areas after the proximal SFA had been treated with a drug-coated balloon. After angioplasty however, there was still a high-grade residual stenosis in the area of previous occlusion as well as multiple areas of greater than 50% stenosis tracking into the above-knee popliteal artery. To treat these areas and provide better flow a 6 mm diameter by 25 cm length Viabahn covered stent was selected and deployed in the mid to proximal SFA down to the above-knee popliteal artery. This was postdilated with a 5 mm diameter high pressure balloon. This resulted in less than 20% residual stenosis at any point that I can identify and the flow was much more brisk distally. I elected to terminate the procedure. The sheath was removed and StarClose closure device was deployed in the left femoral artery with excellent hemostatic result. The patient was taken to the recovery room in stable condition having tolerated the procedure well.  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 Tibial disease present in the tibioperoneal trunk.   Disposition: Patient was taken to the recovery room in stable condition having tolerated the procedure well.  Complications: None  DEW,JASON 02/21/2015 5:20 PM

## 2015-02-21 NOTE — Progress Notes (Signed)
Patient returned from special post procedure.  Upon arrival patient very restless, agitated, and pulling at medical equipment. Patient is comnbative when attempting to assess.  This is a change from baseline.  Lynden Angathy, RN from specials called Dr. Wyn Quakerew and updated him of change.  Order received.

## 2015-02-21 NOTE — Progress Notes (Signed)
Patient transported to specials recovery for angiogram.  Verbal report given to Curly RimAngie Shamblin, RN

## 2015-02-21 NOTE — OR Nursing (Signed)
starclose by Dr Wyn Quakerew at 1700, site managed by [place]

## 2015-02-21 NOTE — Progress Notes (Signed)
MEDICATION RELATED CONSULT NOTE - FOLLOW UP   Pharmacy Consult for Electrolyte Management   Allergies  Allergen Reactions  . Oxycodone Other (See Comments)    "makes me loopy"  . Neomycin-Bacitracin Zn-Polymyx Rash    Patient Measurements: Height: 6\' 2"  (188 cm) Weight: 168 lb 14 oz (76.6 kg) IBW/kg (Calculated) : 82.2 Adjusted Body Weight: n/a  Vital Signs: Temp: 98.8 F (37.1 C) (07/11 0400) Temp Source: Axillary (07/11 0400) BP: 100/55 mmHg (07/11 1300) Pulse Rate: 86 (07/11 1451) Intake/Output from previous day: 07/10 0701 - 07/11 0700 In: 2294.6 [I.V.:894.6; IV Piggyback:1400] Out: 1925 [Urine:1925] Intake/Output from this shift: Total I/O In: 557.9 [I.V.:557.9] Out: -   Labs:  Recent Labs  02/19/15 0600 02/19/15 1521 02/20/15 0510 02/21/15 0550  WBC 8.5  --   --  9.3  HGB 14.4  --   --  13.1  HCT 43.5  --   --  40.0  PLT 189  --   --  141*  CREATININE 1.30*  --  0.99 0.82  MG 1.9  --  1.6* 1.9  PHOS 2.4* 4.2 2.8 2.5   Estimated Creatinine Clearance: 80.4 mL/min (by C-G formula based on Cr of 0.82).   Microbiology: Recent Results (from the past 720 hour(s))  Culture, blood (routine x 2)     Status: None   Collection Time: 01/27/15  3:03 PM  Result Value Ref Range Status   Specimen Description BLOOD  Final   Special Requests Normal  Final   Culture NO GROWTH 5 DAYS  Final   Report Status 02/01/2015 FINAL  Final  Culture, blood (routine x 2)     Status: None   Collection Time: 01/27/15  3:03 PM  Result Value Ref Range Status   Specimen Description BLOOD  Final   Special Requests Normal  Final   Culture NO GROWTH 5 DAYS  Final   Report Status 02/01/2015 FINAL  Final  Wound culture     Status: None   Collection Time: 01/28/15  1:05 PM  Result Value Ref Range Status   Specimen Description LEG  Final   Special Requests Normal  Final   Gram Stain NO WBC SEEN RARE GRAM NEGATIVE RODS   Final   Culture   Final    MODERATE GROWTH PSEUDOMONAS  AERUGINOSA LIGHT GROWTH ENTEROCOCCUS FAECALIS LIGHT GROWTH PROTEUS MIRABILIS    Report Status 02/02/2015 FINAL  Final   Organism ID, Bacteria PSEUDOMONAS AERUGINOSA  Final   Organism ID, Bacteria ENTEROCOCCUS FAECALIS  Final   Organism ID, Bacteria PROTEUS MIRABILIS  Final      Susceptibility   Proteus mirabilis - MIC*    AMPICILLIN >=32 RESISTANT Resistant     CEFAZOLIN 8 SENSITIVE Sensitive     CEFTRIAXONE <=1 SENSITIVE Sensitive     CIPROFLOXACIN <=0.25 SENSITIVE Sensitive     GENTAMICIN >=16 RESISTANT Resistant     IMIPENEM 2 SENSITIVE Sensitive     TRIMETH/SULFA <=20 SENSITIVE Sensitive     CEFOXITIN <=4 SENSITIVE Sensitive     PIP/TAZO Value in next row Sensitive      SENSITIVE<=4    CEFTAZIDIME Value in next row Sensitive      SENSITIVE<=1    * LIGHT GROWTH PROTEUS MIRABILIS   Pseudomonas aeruginosa - MIC*    CEFTAZIDIME Value in next row Sensitive      SENSITIVE<=1    CIPROFLOXACIN Value in next row Sensitive      SENSITIVE<=1    GENTAMICIN Value in next row Sensitive  SENSITIVE<=1    IMIPENEM Value in next row Sensitive      SENSITIVE<=1    PIP/TAZO Value in next row Sensitive      SENSITIVE8    * MODERATE GROWTH PSEUDOMONAS AERUGINOSA   Enterococcus faecalis - MIC*    AMPICILLIN Value in next row Sensitive      SENSITIVE<=2    LINEZOLID Value in next row Sensitive      SENSITIVE2    * LIGHT GROWTH ENTEROCOCCUS FAECALIS  Blood culture (routine x 2)     Status: None (Preliminary result)   Collection Time: 02/17/15  2:30 AM  Result Value Ref Range Status   Specimen Description BLOOD LEFT WRIST  Final   Special Requests BOTTLES DRAWN AEROBIC AND ANAEROBIC  Final   Culture  Setup Time   Final    GRAM NEGATIVE RODS ANAEROBIC BOTTLE ONLY CRITICAL RESULT CALLED TO, READ BACK BY AND VERIFIED WITH: MICHELLE WILLIAMS AT 2026 02/17/15 SDR CONFIRMED BY SFW    Culture   Final    PROTEUS MIRABILIS ANAEROBIC BOTTLE ONLY COAGULASE NEGATIVE  STAPHYLOCOCCUS AEROBIC BOTTLE ONLY POSSIBLE CONTAMINATION WITH SKIN FLORA    Report Status PENDING  Incomplete   Organism ID, Bacteria PROTEUS MIRABILIS  Final      Susceptibility   Proteus mirabilis - MIC*    AMPICILLIN >=32 RESISTANT Resistant     CEFTAZIDIME <=1 SENSITIVE Sensitive     CEFAZOLIN 8 SENSITIVE Sensitive     CEFTRIAXONE <=1 SENSITIVE Sensitive     CIPROFLOXACIN <=0.25 SENSITIVE Sensitive     GENTAMICIN >=16 RESISTANT Resistant     IMIPENEM 8 INTERMEDIATE Intermediate     TRIMETH/SULFA <=20 SENSITIVE Sensitive     * PROTEUS MIRABILIS  Blood culture (routine x 2)     Status: None (Preliminary result)   Collection Time: 02/17/15  2:53 AM  Result Value Ref Range Status   Specimen Description BLOOD LEFT HAND  Final   Special Requests BOTTLES DRAWN AEROBIC AND ANAEROBIC  Final   Culture  Setup Time   Final    GRAM NEGATIVE RODS AEROBIC BOTTLE ONLY CRITICAL RESULT CALLED TO, READ BACK BY AND VERIFIED WITH: MICHELLE WILLIAMS @0451  02/18/15 BY AJO CONFIRMED BY BGB    Culture PROTEUS MIRABILIS AEROBIC BOTTLE ONLY   Final   Report Status PENDING  Incomplete   Organism ID, Bacteria PROTEUS MIRABILIS  Final      Susceptibility   Proteus mirabilis - MIC*    AMPICILLIN >=32 RESISTANT Resistant     CEFTAZIDIME <=1 SENSITIVE Sensitive     CEFAZOLIN 8 SENSITIVE Sensitive     CEFTRIAXONE <=1 SENSITIVE Sensitive     CIPROFLOXACIN <=0.25 SENSITIVE Sensitive     GENTAMICIN >=16 RESISTANT Resistant     IMIPENEM 2 SENSITIVE Sensitive     TRIMETH/SULFA <=20 SENSITIVE Sensitive     * PROTEUS MIRABILIS  MRSA PCR Screening     Status: Abnormal   Collection Time: 02/17/15  8:45 AM  Result Value Ref Range Status   MRSA by PCR POSITIVE (A) NEGATIVE Final    Comment:        The GeneXpert MRSA Assay (FDA approved for NASAL specimens only), is one component of a comprehensive MRSA colonization surveillance program. It is not intended to diagnose MRSA infection nor to guide  or monitor treatment for MRSA infections. CRITICAL RESULT CALLED TO, READ BACK BY AND VERIFIED WITH: STACY COLLIE @ [phone] 02/17/15 [facility]   C difficile quick scan w PCR reflex ([facility] only)  Status: None   Collection Time: 02/18/15 11:39 AM  Result Value Ref Range Status   C Diff antigen NEGATIVE  Final   C Diff toxin NEGATIVE  Final   C Diff interpretation Negative for C. difficile  Final    Medications:  Scheduled:  . amiodarone  200 mg Oral Daily  . Chlorhexidine Gluconate Cloth  6 each Topical Q0600  . ciprofloxacin  400 mg Intravenous Q12H  . collagenase   Topical Daily  . heparin  5,000 Units Subcutaneous 3 times per day  . mupirocin ointment  1 application Nasal BID  . pantoprazole  40 mg Oral QAC breakfast  . tamsulosin  0.4 mg Oral Daily    Assessment: 81 yo male ICU patient receiving NS with 20 mEq of Potassium at 138mL/hr.   Goal of Therapy:  Normalization of electrolytes  Plan:   Electrolytes are WNL. Will obtain follow-up electrolytes with am labs.   Pharmacy will continue to monitor and adjust per consult.    Charlies Silvers, PharmD Clinical Pharmacist 02/21/2015

## 2015-02-21 NOTE — Progress Notes (Signed)
Spoke to Dr. Wyn Quakerew in reference to 14:00 Heparin dose.  Per Dr. Wyn Quakerew hold 14:00 heparin dose.

## 2015-02-21 NOTE — Care Management Note (Signed)
Case Management Note  Patient Details  Name: Ronald Good MRN: [id number] Date of Birth: 11-22-1933  Subjective/Objective:  Angiogram today to lower extremities. CSW working on placement                Action/Plan:   Expected Discharge Date:                  Expected Discharge Plan:  Skilled Nursing Facility  In-House Referral:  Clinical Social Work  Discharge planning Services     Post Acute Care Choice:    Choice offered to:     DME Arranged:    DME Agency:     HH Arranged:    HH Agency:     Status of Service:  In process, will continue to follow  Medicare Important Message Given:    Date Medicare IM Given:    Medicare IM give by:    Date Additional Medicare IM Given:    Additional Medicare Important Message give by:     If discussed at Long Length of Stay Meetings, dates discussed:    Additional Comments:  Marily MemosLisa M Katty Fretwell, RN 02/21/2015, 2:04 PM

## 2015-02-21 NOTE — Progress Notes (Signed)
Post administration of ramazicon patient following simple commands and allowed nurse to swab mouth.  Bilateral mits applied for patient safety.

## 2015-02-21 NOTE — Plan of Care (Signed)
Problem: SLP Dysphagia Goals Goal: Misc Dysphagia Goal Pt will safely tolerate po diet of least restrictive consistency w/ no overt s/s of aspiration noted by Staff/pt/family x3 sessions.    

## 2015-02-21 NOTE — Progress Notes (Signed)
[facility] - [place] at [facility]   PATIENT NAME: Ronald Good    MR#:  [id number]  DATE OF BIRTH:  25-Jun-1934  SUBJECTIVE:  CHIEF COMPLAINT:   Chief Complaint  Patient presents with  . Altered Mental Status   Feels good. Wants to go home. Less lower extremity pains, pains of some lower abdominal discomfort, pains , Dr. Wyn Quaker is planning angiogram today one leg at a time. Gangrenous feet are stable, dressed. No significant drainage. Patient is off pressors. Kidney function has improved. Urine output is better in urine color is lighter, receiving high rate IV fluids, sodium level as well as potassium levels are normal  REVIEW OF SYSTEMS:  Complains of some lower abdominal discomfort pains as well as some lower extremity pains that seem to be subsiding  DRUG ALLERGIES:   Allergies  Allergen Reactions  . Oxycodone Other (See Comments)    "makes me loopy"  . Neomycin-Bacitracin Zn-Polymyx Rash    VITALS:  Blood pressure 101/65, pulse 89, temperature 98.8 F (37.1 C), temperature source Axillary, resp. rate 13, height  (1.88 m), weight 76.6 kg (168 lb 14 oz), SpO2 97 %.  PHYSICAL EXAMINATION:  GENERAL:  81 y.o.-year-old patient lying in the bed, more  responsive today is able to converse , following commands intermittently  EYES: Pupils equal, round, reactive to light and accommodation. No scleral icterus. Extraocular muscles intact.  HEENT: Head atraumatic, normocephalic. Oropharynx and nasopharynx clear. More moist . Oral mucosa. NECK:  Supple, no jugular venous distention. No thyroid enlargement, no tenderness.  LUNGS: Normal breath sounds bilaterally, no wheezing, rales,rhonchi or crepitation,better inspiratory  effort. No use of accessory muscles of respiration.  CARDIOVASCULAR: S1, S2 normal. No murmurs, rubs, or gallops.  ABDOMEN: Soft, nontender, nondistended. Bowel sounds present. No organomegaly or mass. Mild discomfort on palpation in suprapubic  area but no rebound or guarding was noted  EXTREMITIES: trace pedal edema, open wounds were noted in the anterior shins in lower part, especially on the left with some necrotic areas of the toes and metacarpal phalangeal area. Extensive ulcerations in the anterior aspect of the right lower extremity. dressing is applied. No infection signs were noted. Presence of necrosis on the right side as well . Peripheral pulses are nonpalpable.  Severe pain on manipulation of the patient's feet NEUROLOGIC: Cranial nerves II through XII are intact. Muscle strength 3/5 in all extremities. Sensation intact. Gait not checked.  PSYCHIATRIC: The patient is somnolent , confused.  SKIN: Sacral DU stage 2. Leg wound in dressing. Poor turgor.   LABORATORY PANEL:   CBC  Recent Labs Lab 02/21/15 0550  WBC 9.3  HGB 13.1  HCT 40.0  PLT 141*   ------------------------------------------------------------------------------------------------------------------  Chemistries   Recent Labs Lab 02/17/15 0214  02/21/15 0550  NA 149*  < > 143  K 4.7  < > 3.5  CL 107  < > 109  CO2 29  < > 28  GLUCOSE 221*  < > 88  BUN 91*  < > 19  CREATININE 3.20*  < > 0.82  CALCIUM 8.7*  < > 7.4*  MG  --   < > 1.9  AST 131*  --   --   ALT 51  --   --   ALKPHOS 197*  --   --   BILITOT 2.0*  --   --   < > = values in this interval not displayed. ------------------------------------------------------------------------------------------------------------------  Cardiac Enzymes  Recent Labs Lab 02/17/15 0214  TROPONINI 0.03   ------------------------------------------------------------------------------------------------------------------  RADIOLOGY:  No results found.  EKG:   Orders placed or performed during the hospital encounter of 02/17/15  . EKG 12-Lead  . EKG 12-Lead    ASSESSMENT AND PLAN:   HAP and Proteus mirabilis septicemia.Now on vancomycin and ciprofloxacin, continuen IV,  remains afebrile and  blood pressure has improved , white blood cell count is normal  Septic shock.  Tapered off levophed drip. Off , Lasix, toprol and metolazone.  ARF. Status post NS iv bolus. Kidney function has improved significantly .  Follow up BMP. Hold lasix, toprol and metolazone. Kidney US: no obstruction.   Hypernatremia. Na 143 today. Continue   saline and D5 water. Patient's urine output is  better and the urine color is lighter , signifying better hydration  Hypokalemia. ,, Resolved  with KCl in IV fluids. F/u BMP tomorrow.   Lactic acidosis. improved  Sacral decubitus ulcer stage 2 and  leg wounds. Wound care appreciated.Appreciate vascular  surgery involvement , planning  Angiogram today.   AFib.Continue amiodarone and resume pradaxa after procedures  Chronic systolic CHF. Stable with IV fluid administration, DC IV fluids as long as patient is able to take orally.     All the records are reviewed and case discussed with Care Management/Social Workerr. Management plans discussed with the patient, family and they are in agreement.  CODE STATUS: FULL CODE (I discussed with patient's daughter yesterday. Her sister is POA. Full code for now.)  TOTAL CRITICAL TIME TAKING CARE OF THIS PATIENT: 40 minutes.    Katharina CaperVAICKUTE,Mireyah Chervenak M.D on 02/21/2015 at 11:48 AM  Between 7am to 6pm - Pager - [phone]  After 6pm go to [email or web address] - password [email or web address] [facility]  [facility]  Office  [phone]  CC: Primary care physician; Marisue IvanLINTHAVONG, KANHKA, MD

## 2015-02-22 ENCOUNTER — Inpatient Hospital Stay: Payer: Commercial Managed Care - HMO

## 2015-02-22 LAB — CBC
HCT: 40.2 % (ref 40.0–52.0)
Hemoglobin: 13.1 g/dL (ref 13.0–18.0)
MCH: 32.4 pg (ref 26.0–34.0)
MCHC: 32.7 g/dL (ref 32.0–36.0)
MCV: 99 fL (ref 80.0–100.0)
Platelets: 139 10*3/uL — ABNORMAL LOW (ref 150–440)
RBC: 4.06 MIL/uL — ABNORMAL LOW (ref 4.40–5.90)
RDW: 16.9 % — ABNORMAL HIGH (ref 11.5–14.5)
WBC: 9.5 10*3/uL (ref 3.8–10.6)

## 2015-02-22 LAB — CULTURE, BLOOD (ROUTINE X 2)

## 2015-02-22 LAB — BASIC METABOLIC PANEL
Anion gap: 5 (ref 5–15)
BUN: 15 mg/dL (ref 6–20)
CO2: 29 mmol/L (ref 22–32)
Calcium: 7.9 mg/dL — ABNORMAL LOW (ref 8.9–10.3)
Chloride: 108 mmol/L (ref 101–111)
Creatinine, Ser: 0.67 mg/dL (ref 0.61–1.24)
GFR calc Af Amer: >60 mL/min (ref >60)
GFR calc non Af Amer: >60 mL/min (ref >60)
GLUCOSE: 75 mg/dL (ref 65–99)
Potassium: 3.3 mmol/L — ABNORMAL LOW (ref 3.5–5.1)
Sodium: 142 mmol/L (ref 135–145)

## 2015-02-22 LAB — PHOSPHORUS: PHOSPHORUS: 2.8 mg/dL (ref 2.5–4.6)

## 2015-02-22 LAB — MAGNESIUM: Magnesium: 1.7 mg/dL (ref 1.7–2.4)

## 2015-02-22 MED ORDER — SODIUM CHLORIDE 0.9 % IV BOLUS (SEPSIS)
500.0000 mL | Freq: Once | INTRAVENOUS | Status: AC
  Administered 2015-02-22: 500 mL via INTRAVENOUS

## 2015-02-22 MED ORDER — SODIUM CHLORIDE 0.9 % IV BOLUS (SEPSIS)
500.0000 mL | Freq: Once | INTRAVENOUS | Status: AC
Start: 2015-02-22 — End: 2015-02-22
  Administered 2015-02-22: 15:00:00 via INTRAVENOUS

## 2015-02-22 MED ORDER — POTASSIUM CHLORIDE 20 MEQ PO PACK
40.0000 meq | PACK | Freq: Once | ORAL | Status: DC
  Filled 2015-02-22: qty 2

## 2015-02-22 MED ORDER — SODIUM CHLORIDE 0.9 % IV SOLN
Freq: Once | INTRAVENOUS | Status: AC
  Administered 2015-02-22: 13:00:00 via INTRAVENOUS
  Filled 2015-02-22: qty 15

## 2015-02-22 MED ORDER — MAGNESIUM SULFATE 2 GM/50ML IV SOLN
2.0000 g | Freq: Once | INTRAVENOUS | Status: AC
  Administered 2015-02-22: 2 g via INTRAVENOUS
  Filled 2015-02-22: qty 50

## 2015-02-22 MED ORDER — ENSURE ENLIVE PO LIQD: 237.0000 mL | Freq: Three times a day (TID) | ORAL | Status: DC

## 2015-02-22 NOTE — Care Management Important Message (Signed)
Important Message  Patient Details  Name: Ronald Good MRN: [id number] Date of Birth: 11/09/1933   Medicare Important Message Given:  Yes-second notification given    Olegario MessierKathy A Allmond 02/22/2015, 10:26 AM   Pt. In isolation and Gweneth DimitriLisa Jacobs, RN CM gave IM.

## 2015-02-22 NOTE — Progress Notes (Signed)
[facility]  Daily Progress Note   Subjective  - 1 Day Post-Op  Doing well.  Awake and alert No events overnight   Objective Filed Vitals:   02/22/15 0500 02/22/15 0600 02/22/15 0700 02/22/15 0800  BP: 100/61 85/73 104/61 111/57  Pulse: 92 89 92 89  Temp:    98.8 F (37.1 C)  TempSrc:    Oral  Resp: 13 13 16 14   Height:      Weight:      SpO2: 97% 96% 98% 100%    Intake/Output Summary (Last 24 hours) at 02/22/15 0826 Last data filed at 02/22/15 0800  Gross per 24 hour  Intake 1956.67 ml  Output   1375 ml  Net 581.67 ml    PULM  CTAB CV  RRR VASC  Doppler signals present LLE.  Access site C/D/I  Laboratory CBC    Component Value Date/Time   WBC 9.5 02/22/2015 0559   WBC 9.2 07/31/2014 0611   HGB 13.1 02/22/2015 0559   HGB 12.4* 07/31/2014 0611   HCT 40.2 02/22/2015 0559   HCT 37.2* 07/31/2014 0611   PLT 139* 02/22/2015 0559   PLT 153 07/31/2014 0611    BMET    Component Value Date/Time   NA 142 02/22/2015 0559   NA 138 08/02/2014 0424   K 3.3* 02/22/2015 0559   K 3.6 08/02/2014 0424   CL 108 02/22/2015 0559   CL 103 08/02/2014 0424   CO2 29 02/22/2015 0559   CO2 28 08/02/2014 0424   GLUCOSE 75 02/22/2015 0559   GLUCOSE 89 08/02/2014 0424   BUN 15 02/22/2015 0559   BUN 10 08/02/2014 0424   CREATININE 0.67 02/22/2015 0559   CREATININE 0.90 08/02/2014 0424   CALCIUM 7.9* 02/22/2015 0559   CALCIUM 8.3* 08/02/2014 0424   GFRNONAA >60 02/22/2015 0559   GFRNONAA >60 03/24/2014 0532   GFRAA >60 02/22/2015 0559   GFRAA >60 03/24/2014 0532    Assessment/Planning: POD #1 s/p PTA and stent to left SFA, PTA to TP trunk Doing well   Renal function stable so far  Did well with procedure and with improved blood flow, healing may be possible  Will need right leg angio done either later this week or next week    DEW,JASON  02/22/2015, 8:26 AM

## 2015-02-22 NOTE — Care Management Important Message (Signed)
Important Message  Patient Details  Name: Alfonse FlavorsSamuel Ellis Belsito MRN: [id number] Date of Birth: 08-07-1934   Medicare Important Message Given:  Yes-second notification given    Olegario MessierKathy A Allmond 02/22/2015, 10:26 AM

## 2015-02-22 NOTE — Plan of Care (Signed)
Problem: Problem: Skin/Wound Progression Goal: HEALING PRESSURE ULCER Outcome: Not Progressing Doppler pulses to bilateral lower extremities. Wound care as ordered with santyl, mepetel, guaze and kerlex. No change in ulceration. Pt thinks left leg may be less painful. Goal: APPROPRIATE NUTRITIONAL STATUS Outcome: Not Progressing Pt coughing frequently after taking thickened liquids this am.  Made NPO.  Modified barium swallow done at 2pm.  Hopefully intake will improve this evening.  Prefers and ensure strawberry shake.  Problem: Phase I Progression Outcomes Goal: Pain controlled with appropriate interventions Outcome: Progressing Morphine prior to dressing changes and linen changes Goal: Code status addressed with pt/family Outcome: Progressing Pt wants "everything done to save me" at this point Goal: Initial discharge plan identified Outcome: Progressing FOR SNF at discharge Goal: Voiding-avoid urinary catheter unless indicated Outcome: Not Met (add Reason) Foley cath to assess renal function and for skin care due to urine and bowel Incontinence in pt with sacral and buttock wounds. Goal: Hemodynamically stable Outcome: Progressing 542ml NS bolus given for bp improvemnt

## 2015-02-22 NOTE — Progress Notes (Signed)
MEDICATION RELATED CONSULT NOTE - FOLLOW UP   Pharmacy Consult for Electrolyte Management   Allergies  Allergen Reactions  . Oxycodone Other (See Comments)    "makes me loopy"  . Neomycin-Bacitracin Zn-Polymyx Rash    Patient Measurements: Height: 6\' 2"  (188 cm) Weight: 168 lb 14 oz (76.6 kg) IBW/kg (Calculated) : 82.2 Adjusted Body Weight: n/a  Vital Signs: Temp: 97.9 F (36.6 C) (07/12 1545) Temp Source: Axillary (07/12 1545) BP: 90/60 mmHg (07/12 1545) Pulse Rate: 103 (07/12 1545) Intake/Output from previous day: 07/11 0701 - 07/12 0700 In: 1856.7 [I.V.:1456.7; IV Piggyback:400] Out: 775 [Urine:775] Intake/Output from this shift: Total I/O In: 1450 [I.V.:250; IV Piggyback:1200] Out: 1100 [Urine:1100]  Labs:  Recent Labs  02/20/15 0510 02/21/15 0550 02/22/15 0559  WBC  --  9.3 9.5  HGB  --  13.1 13.1  HCT  --  40.0 40.2  PLT  --  141* 139*  CREATININE 0.99 0.82 0.67  MG 1.6* 1.9 1.7  PHOS 2.8 2.5 2.8   Estimated Creatinine Clearance: 82.5 mL/min (by C-G formula based on Cr of 0.67).   Microbiology: Recent Results (from the past 720 hour(s))  Culture, blood (routine x 2)     Status: None   Collection Time: 01/27/15  3:03 PM  Result Value Ref Range Status   Specimen Description BLOOD  Final   Special Requests Normal  Final   Culture NO GROWTH 5 DAYS  Final   Report Status 02/01/2015 FINAL  Final  Culture, blood (routine x 2)     Status: None   Collection Time: 01/27/15  3:03 PM  Result Value Ref Range Status   Specimen Description BLOOD  Final   Special Requests Normal  Final   Culture NO GROWTH 5 DAYS  Final   Report Status 02/01/2015 FINAL  Final  Wound culture     Status: None   Collection Time: 01/28/15  1:05 PM  Result Value Ref Range Status   Specimen Description LEG  Final   Special Requests Normal  Final   Gram Stain NO WBC SEEN RARE GRAM NEGATIVE RODS   Final   Culture   Final    MODERATE GROWTH PSEUDOMONAS AERUGINOSA LIGHT GROWTH  ENTEROCOCCUS FAECALIS LIGHT GROWTH PROTEUS MIRABILIS    Report Status 02/02/2015 FINAL  Final   Organism ID, Bacteria PSEUDOMONAS AERUGINOSA  Final   Organism ID, Bacteria ENTEROCOCCUS FAECALIS  Final   Organism ID, Bacteria PROTEUS MIRABILIS  Final      Susceptibility   Proteus mirabilis - MIC*    AMPICILLIN >=32 RESISTANT Resistant     CEFAZOLIN 8 SENSITIVE Sensitive     CEFTRIAXONE <=1 SENSITIVE Sensitive     CIPROFLOXACIN <=0.25 SENSITIVE Sensitive     GENTAMICIN >=16 RESISTANT Resistant     IMIPENEM 2 SENSITIVE Sensitive     TRIMETH/SULFA <=20 SENSITIVE Sensitive     CEFOXITIN <=4 SENSITIVE Sensitive     PIP/TAZO Value in next row Sensitive      SENSITIVE<=4    CEFTAZIDIME Value in next row Sensitive      SENSITIVE<=1    * LIGHT GROWTH PROTEUS MIRABILIS   Pseudomonas aeruginosa - MIC*    CEFTAZIDIME Value in next row Sensitive      SENSITIVE<=1    CIPROFLOXACIN Value in next row Sensitive      SENSITIVE<=1    GENTAMICIN Value in next row Sensitive      SENSITIVE<=1    IMIPENEM Value in next row Sensitive  SENSITIVE<=1    PIP/TAZO Value in next row Sensitive      SENSITIVE8    * MODERATE GROWTH PSEUDOMONAS AERUGINOSA   Enterococcus faecalis - MIC*    AMPICILLIN Value in next row Sensitive      SENSITIVE<=2    LINEZOLID Value in next row Sensitive      SENSITIVE2    * LIGHT GROWTH ENTEROCOCCUS FAECALIS  Blood culture (routine x 2)     Status: None (Preliminary result)   Collection Time: 02/17/15  2:30 AM  Result Value Ref Range Status   Specimen Description BLOOD LEFT WRIST  Final   Special Requests BOTTLES DRAWN AEROBIC AND ANAEROBIC  Final   Culture  Setup Time   Final    GRAM NEGATIVE RODS ANAEROBIC BOTTLE ONLY CRITICAL RESULT CALLED TO, READ BACK BY AND VERIFIED WITH: MICHELLE WILLIAMS AT 2026 02/17/15 SDR CONFIRMED BY SFW    Culture   Final    PROTEUS MIRABILIS ANAEROBIC BOTTLE ONLY COAGULASE NEGATIVE STAPHYLOCOCCUS AEROBIC BOTTLE ONLY POSSIBLE  CONTAMINATION WITH SKIN FLORA    Report Status PENDING  Incomplete   Organism ID, Bacteria PROTEUS MIRABILIS  Final      Susceptibility   Proteus mirabilis - MIC*    AMPICILLIN >=32 RESISTANT Resistant     CEFTAZIDIME <=1 SENSITIVE Sensitive     CEFAZOLIN 8 SENSITIVE Sensitive     CEFTRIAXONE <=1 SENSITIVE Sensitive     CIPROFLOXACIN <=0.25 SENSITIVE Sensitive     GENTAMICIN >=16 RESISTANT Resistant     IMIPENEM 8 INTERMEDIATE Intermediate     TRIMETH/SULFA <=20 SENSITIVE Sensitive     * PROTEUS MIRABILIS  Blood culture (routine x 2)     Status: None   Collection Time: 02/17/15  2:53 AM  Result Value Ref Range Status   Specimen Description BLOOD LEFT HAND  Final   Special Requests BOTTLES DRAWN AEROBIC AND ANAEROBIC  Final   Culture  Setup Time   Final    GRAM NEGATIVE RODS AEROBIC BOTTLE ONLY CRITICAL RESULT CALLED TO, READ BACK BY AND VERIFIED WITH: MICHELLE WILLIAMS @0451  02/18/15 BY AJO CONFIRMED BY [facility]    Culture PROTEUS MIRABILIS AEROBIC BOTTLE ONLY   Final   Report Status 02/22/2015 FINAL  Final   Organism ID, Bacteria PROTEUS MIRABILIS  Final      Susceptibility   Proteus mirabilis - MIC*    AMPICILLIN >=32 RESISTANT Resistant     CEFTAZIDIME <=1 SENSITIVE Sensitive     CEFAZOLIN 8 SENSITIVE Sensitive     CEFTRIAXONE <=1 SENSITIVE Sensitive     CIPROFLOXACIN <=0.25 SENSITIVE Sensitive     GENTAMICIN >=16 RESISTANT Resistant     IMIPENEM 2 SENSITIVE Sensitive     TRIMETH/SULFA <=20 SENSITIVE Sensitive     * PROTEUS MIRABILIS  MRSA PCR Screening     Status: Abnormal   Collection Time: 02/17/15  8:45 AM  Result Value Ref Range Status   MRSA by PCR POSITIVE (A) NEGATIVE Final    Comment:        The GeneXpert MRSA Assay (FDA approved for NASAL specimens only), is one component of a comprehensive MRSA colonization surveillance program. It is not intended to diagnose MRSA infection nor to guide or monitor treatment for MRSA infections. CRITICAL RESULT CALLED  TO, READ BACK BY AND VERIFIED WITH: STACY COLLIE @ [phone] 02/17/15 [facility]   C difficile quick scan w PCR reflex ([facility] only)     Status: None   Collection Time: 02/18/15 11:39 AM  Result Value  Ref Range Status   C Diff antigen NEGATIVE  Final   C Diff toxin NEGATIVE  Final   C Diff interpretation Negative for C. difficile  Final    Medications:  Scheduled:  . amiodarone  200 mg Oral Daily  . ciprofloxacin  400 mg Intravenous Q12H  . collagenase   Topical Daily  . heparin  5,000 Units Subcutaneous 3 times per day  . pantoprazole  40 mg Oral QAC breakfast  . small volume/piggyback builder   Intravenous Once  . tamsulosin  0.4 mg Oral Daily    Assessment: 82 yo male ICU patient receiving NS with 20 mEq of Potassium at 126mL/hr.   Goal of Therapy:  Normalization of electrolytes  Plan:   Potassium IV x 1 and magnesium 2g IV x 1 replaced. Will obtain follow-up electrolytes with am labs.   Pharmacy will continue to monitor and adjust per consult.    Charlies Silvers, PharmD Clinical Pharmacist 02/22/2015

## 2015-02-22 NOTE — Progress Notes (Addendum)
[facility] [place] [facility]   PATIENT NAME: Ronald CrookSamuel Good    MR#:  [id number]  DATE OF BIRTH:  1933-09-05  SUBJECTIVE:  CHIEF COMPLAINT:   Chief Complaint  Patient presents with  . Altered Mental Status   Feels good. Wants to walking, feels less of pain in his lower extremities today. Status post angiogram 11th of July 2016 revealing peripheral vascular disease in left lower extremity, status post PTA. She admits of having some swallowing difficulties choking with food and regurgitating. Attempted barium swallowing study not completed due to penetration. Modified Barium swallow study revealed significant dysphagia. Now on dysphagia diet  REVIEW OF SYSTEMS:  Complains of some lower abdominal discomfort pains as well as some lower extremity pains that seem to be subsiding  DRUG ALLERGIES:   Allergies  Allergen Reactions  . Oxycodone Other (See Comments)    "makes me loopy"  . Neomycin-Bacitracin Zn-Polymyx Rash    VITALS:  Blood pressure 103/78, pulse 104, temperature 98.8 F (37.1 C), temperature source Oral, resp. rate 19, height 6\' 2"  (1.88 m), weight 76.6 kg (168 lb 14 oz), SpO2 96 %.  PHYSICAL EXAMINATION:  GENERAL:  81 y.o.-year-old patient lying in the bed, more  responsive today is able to converse , following commands intermittently  EYES: Pupils equal, round, reactive to light and accommodation. No scleral icterus. Extraocular muscles intact.  HEENT: Head atraumatic, normocephalic. Oropharynx and nasopharynx clear. More moist . Oral mucosa. NECK:  Supple, no jugular venous distention. No thyroid enlargement, no tenderness.  LUNGS: Normal breath sounds bilaterally, no wheezing, rales,rhonchi or crepitation,better inspiratory  effort. No use of accessory muscles of respiration.  CARDIOVASCULAR: S1, S2 normal. No murmurs, rubs, or gallops.  ABDOMEN: Soft, nontender, nondistended. Bowel sounds present. No organomegaly or mass. Mild discomfort on  palpation in suprapubic area but no rebound or guarding was noted  EXTREMITIES: trace pedal edema, open wounds were noted in the anterior shins in lower part, especially on the left with some necrotic areas of the toes and metacarpal phalangeal area. Extensive ulcerations in the anterior aspect of the right lower extremity. dressing is applied. No infection signs were noted. Presence of necrosis on the right side as well . Peripheral pulses are nonpalpable.  Severe pain on manipulation of the patient's feet NEUROLOGIC: Cranial nerves II through XII are intact. Muscle strength 3/5 in all extremities. Sensation intact. Gait not checked.  PSYCHIATRIC: The patient is somnolent , confused.  SKIN: Sacral DU stage 2. Leg wound in dressing. Poor turgor.   LABORATORY PANEL:   CBC  Recent Labs Lab 02/22/15 0559  WBC 9.5  HGB 13.1  HCT 40.2  PLT 139*   ------------------------------------------------------------------------------------------------------------------  Chemistries   Recent Labs Lab 02/17/15 0214  02/22/15 0559  NA 149*  < > 142  K 4.7  < > 3.3*  CL 107  < > 108  CO2 29  < > 29  GLUCOSE 221*  < > 75  BUN 91*  < > 15  CREATININE 3.20*  < > 0.67  CALCIUM 8.7*  < > 7.9*  MG  --   < > 1.7  AST 131*  --   --   ALT 51  --   --   ALKPHOS 197*  --   --   BILITOT 2.0*  --   --   < > = values in this interval not displayed. ------------------------------------------------------------------------------------------------------------------  Cardiac Enzymes  Recent Labs Lab 02/17/15 0214  TROPONINI 0.03   ------------------------------------------------------------------------------------------------------------------  RADIOLOGY:  Dg Esophagus  02/22/2015   CLINICAL DATA:  62 old male possible aspiration. Initial encounter.  EXAM: ESOPHOGRAM/BARIUM SWALLOW  TECHNIQUE: Single contrast examination was performed using  thin barium.  FLUOROSCOPY TIME:  Radiation  Exposure Index (as provided by the fluoroscopic device): 493.05 micro Gy cm2  COMPARISON:  None.  FINDINGS: The present examination was performed in a limited fashion given patient's inability to move. Patient was not able to be placed on table secondary to tachycardia and therefore imaging performed in sitting position and only the proximal and mid esophagus were able to be adequately assessed during modified swallowing exam. Exam requested by (and results discussed by telephone with) Dr. Winona Legato  Flash laryngeal penetration without aspiration noted.  Poor esophageal motility with presbyesophagus. Delayed emptying of ingested contents.  IMPRESSION: Only the proximal and mid aspect of the thoracic esophagus were able to be evaluated. Prominent presbyesophagus with delayed emptying of ingested contents.   Electronically Signed   By: Lacy Duverney M.D.   On: 02/22/2015 15:03    EKG:   Orders placed or performed during the hospital encounter of 02/17/15  . EKG 12-Lead  . EKG 12-Lead    ASSESSMENT AND PLAN:   HAP and Proteus mirabilis septicemia.Now off vancomycin and only on ciprofloxacin IV,  remains afebrile and blood pressure has improved , white blood cell count is normal  Septic shock.  Tapered off levophed drip. Off , Lasix, toprol and metolazone for now.  ARF. Status post NS iv bolus and IV fluid, continues administration. Kidney function has improved.  Follow up BMP. Hold lasix, toprol and metolazone. Kidney US: no obstruction.   Hypernatremia. Na 142 today. Continue  saline and D5 water. Patient's urine output is  better and the urine color is lighter , signifying better hydration, stable at present. However, patient has dysphagia and they will be on dysphagia diet with thickened liquids. Unfortunately predisposing him to dehydration.   Hypokalemia. , Resolved  with KCl in IV fluids. F/u BMP tomorrow.   Lactic acidosis. improved  Sacral decubitus ulcer stage 2 and  leg wounds. Wound  care appreciated.Appreciate vascular  surgery involvement , s/p Angiogram y-day. Following healing. Likely angiogram on right lower extremity in the nearest future  AFib.Continue amiodarone and resume pradaxa after procedures  Chronic systolic CHF. Stable with IV fluid administration, DC IV fluids as long as patient is able to take orally, possibly tomorrow .     All the records are reviewed and case discussed with Care Management/Social Workerr. Management plans discussed with the patient, family and they are in agreement.  CODE STATUS: FULL CODE (I discussed with patient's daughter yesterday. Her sister is POA. Full code for now.)  TOTAL CRITICAL TIME TAKING CARE OF THIS PATIENT: 40 minutes.   Discussion with  patient's wife as well as his 2 daughters, questions answered. The patient, family voiced understanding and agreement. Time spent approximately 15 more minutes Harumi Yamin M.D on 02/22/2015 at 3:38 PM  Between 7am to 6pm - Pager - [phone]  After 6pm go to [email or web address] - password [email or web address]  [place] [facility]  Office  [phone]  CC: Primary care physician; Marisue Ivan, MD

## 2015-02-22 NOTE — Care Management Note (Signed)
Case Management Note  Patient Details  Name: Alfonse FlavorsSamuel Ellis Duerr MRN: [id number] Date of Birth: 15-Nov-1933  Subjective/Objective:  Dr. Wyn Quakerew in,  POD #1 s/p left lower extremity angiogram and stent. Healing with potential for improvement. Plan is right leg angiogram later within the next week.                  Action/Plan:   Expected Discharge Date:                  Expected Discharge Plan:  Skilled Nursing Facility  In-House Referral:  Clinical Social Work  Discharge planning Services     Post Acute Care Choice:    Choice offered to:     DME Arranged:    DME Agency:     HH Arranged:    HH Agency:     Status of Service:  In process, will continue to follow  Medicare Important Message Given:    Date Medicare IM Given:    Medicare IM give by:    Date Additional Medicare IM Given:    Additional Medicare Important Message give by:     If discussed at Long Length of Stay Meetings, dates discussed:    Additional Comments:  Marily MemosLisa M Lindamarie Maclachlan, RN 02/22/2015, 8:58 AM

## 2015-02-22 NOTE — Progress Notes (Signed)
Speech Therapy Note for MBSS: pt completed MBSS today which revealed moderate-severe oropharyngeal phase dysphagia; Esophageal phase dysphagia and dysmotility. Pt exhibited a delayed pharyngeal swallow initiation w/ liquid trials and increased pharyngeal residue of food/liquid trials which resulted in laryngeal penetration during this study; this greatly increases risk for aspiration to follow from the buildup of laryngeal penetration also. Pt appeared able to tolerate trials of Honey consistency liquids and purees following aspiration precautions. This diet consistency was rec'd. MD consulted and updated; NSG updated. ST will f/u w/ toleration of diet; education. See full report to follow.

## 2015-02-23 ENCOUNTER — Inpatient Hospital Stay: Payer: Commercial Managed Care - HMO

## 2015-02-23 LAB — COMPREHENSIVE METABOLIC PANEL
ALK PHOS: 224 U/L — AB (ref 38–126)
ALT: 34 U/L (ref 17–63)
AST: 72 U/L — ABNORMAL HIGH (ref 15–41)
Albumin: 1.5 g/dL — ABNORMAL LOW (ref 3.5–5.0)
Anion gap: 4 — ABNORMAL LOW (ref 5–15)
BUN: 13 mg/dL (ref 6–20)
CALCIUM: 7.4 mg/dL — AB (ref 8.9–10.3)
CHLORIDE: 115 mmol/L — AB (ref 101–111)
CO2: 24 mmol/L (ref 22–32)
CREATININE: 0.79 mg/dL (ref 0.61–1.24)
GFR calc Af Amer: >60 mL/min (ref >60)
Glucose, Bld: 120 mg/dL — ABNORMAL HIGH (ref 65–99)
Potassium: 3.7 mmol/L (ref 3.5–5.1)
Sodium: 143 mmol/L (ref 135–145)
Total Bilirubin: 1.4 mg/dL — ABNORMAL HIGH (ref 0.3–1.2)
Total Protein: 5.2 g/dL — ABNORMAL LOW (ref 6.5–8.1)

## 2015-02-23 LAB — BASIC METABOLIC PANEL
ANION GAP: 6 (ref 5–15)
BUN: 13 mg/dL (ref 6–20)
CALCIUM: 7.4 mg/dL — AB (ref 8.9–10.3)
CHLORIDE: 108 mmol/L (ref 101–111)
CO2: 26 mmol/L (ref 22–32)
Creatinine, Ser: 0.7 mg/dL (ref 0.61–1.24)
GFR calc non Af Amer: >60 mL/min (ref >60)
GLUCOSE: 99 mg/dL (ref 65–99)
Potassium: 3.8 mmol/L (ref 3.5–5.1)
Sodium: 140 mmol/L (ref 135–145)

## 2015-02-23 LAB — MAGNESIUM: Magnesium: 1.9 mg/dL (ref 1.7–2.4)

## 2015-02-23 MED ORDER — HALOPERIDOL LACTATE 5 MG/ML IJ SOLN: 1.0000 mg | Freq: Four times a day (QID) | INTRAMUSCULAR | Status: DC | PRN

## 2015-02-23 MED ORDER — POTASSIUM CHLORIDE CRYS ER 20 MEQ PO TBCR
20.0000 meq | EXTENDED_RELEASE_TABLET | Freq: Once | ORAL | Status: AC
  Administered 2015-02-23: 20 meq via ORAL
  Filled 2015-02-23: qty 1

## 2015-02-23 MED ORDER — MAGNESIUM OXIDE 400 (241.3 MG) MG PO TABS
400.0000 mg | ORAL_TABLET | Freq: Every day | ORAL | Status: DC
  Administered 2015-02-23 – 2015-02-25 (×3): 400 mg via ORAL
  Filled 2015-02-23 (×3): qty 1

## 2015-02-23 NOTE — Progress Notes (Signed)
MEDICATION RELATED CONSULT NOTE -follow up  Pharmacy Consult for Renal adjustment of antibiotics(currently Cipro) Indication: Renal dose adjustment  Allergies  Allergen Reactions  . Oxycodone Other (See Comments)    "makes me loopy"  . Neomycin-Bacitracin Zn-Polymyx Rash    Patient Measurements: Height:  (188 cm) Weight: 168 lb 14 oz (76.6 kg) IBW/kg (Calculated) : 82.2  Vital Signs: Temp: 98 F (36.7 C) (07/13 1143) Temp Source: Oral (07/13 1143) BP: 92/56 mmHg (07/13 1143) Pulse Rate: 94 (07/13 1143) Intake/Output from previous day: 07/12 0701 - 07/13 0700 In: 2691.7 [I.V.:1491.7; IV Piggyback:1200] Out: 1400 [Urine:1400] Intake/Output from this shift: Total I/O In: 500 [P.O.:500] Out: 445 [Urine:445]  Labs:  Recent Labs  02/21/15 0550 02/22/15 0559 02/23/15 0515  WBC 9.3 9.5  --   HGB 13.1 13.1  --   HCT 40.0 40.2  --   PLT 141* 139*  --   CREATININE 0.82 0.67 0.70  MG 1.9 1.7 1.9  PHOS 2.5 2.8  --    Estimated Creatinine Clearance: 82.5 mL/min (by C-G formula based on Cr of 0.7).   Microbiology: Recent Results (from the past 720 hour(s))  Culture, blood (routine x 2)     Status: None   Collection Time: 01/27/15  3:03 PM  Result Value Ref Range Status   Specimen Description BLOOD  Final   Special Requests Normal  Final   Culture NO GROWTH 5 DAYS  Final   Report Status 02/01/2015 FINAL  Final  Culture, blood (routine x 2)     Status: None   Collection Time: 01/27/15  3:03 PM  Result Value Ref Range Status   Specimen Description BLOOD  Final   Special Requests Normal  Final   Culture NO GROWTH 5 DAYS  Final   Report Status 02/01/2015 FINAL  Final  Wound culture     Status: None   Collection Time: 01/28/15  1:05 PM  Result Value Ref Range Status   Specimen Description LEG  Final   Special Requests Normal  Final   Gram Stain NO WBC SEEN RARE GRAM NEGATIVE RODS   Final   Culture   Final    MODERATE GROWTH PSEUDOMONAS AERUGINOSA LIGHT  GROWTH ENTEROCOCCUS FAECALIS LIGHT GROWTH PROTEUS MIRABILIS    Report Status 02/02/2015 FINAL  Final   Organism ID, Bacteria PSEUDOMONAS AERUGINOSA  Final   Organism ID, Bacteria ENTEROCOCCUS FAECALIS  Final   Organism ID, Bacteria PROTEUS MIRABILIS  Final      Susceptibility   Proteus mirabilis - MIC*    AMPICILLIN >=32 RESISTANT Resistant     CEFAZOLIN 8 SENSITIVE Sensitive     CEFTRIAXONE <=1 SENSITIVE Sensitive     CIPROFLOXACIN <=0.25 SENSITIVE Sensitive     GENTAMICIN >=16 RESISTANT Resistant     IMIPENEM 2 SENSITIVE Sensitive     TRIMETH/SULFA <=20 SENSITIVE Sensitive     CEFOXITIN <=4 SENSITIVE Sensitive     PIP/TAZO Value in next row Sensitive      SENSITIVE<=4    CEFTAZIDIME Value in next row Sensitive      SENSITIVE<=1    * LIGHT GROWTH PROTEUS MIRABILIS   Pseudomonas aeruginosa - MIC*    CEFTAZIDIME Value in next row Sensitive      SENSITIVE<=1    CIPROFLOXACIN Value in next row Sensitive      SENSITIVE<=1    GENTAMICIN Value in next row Sensitive      SENSITIVE<=1    IMIPENEM Value in next row Sensitive  SENSITIVE<=1    PIP/TAZO Value in next row Sensitive      SENSITIVE8    * MODERATE GROWTH PSEUDOMONAS AERUGINOSA   Enterococcus faecalis - MIC*    AMPICILLIN Value in next row Sensitive      SENSITIVE<=2    LINEZOLID Value in next row Sensitive      SENSITIVE2    * LIGHT GROWTH ENTEROCOCCUS FAECALIS  Blood culture (routine x 2)     Status: None (Preliminary result)   Collection Time: 02/17/15  2:30 AM  Result Value Ref Range Status   Specimen Description BLOOD LEFT WRIST  Final   Special Requests BOTTLES DRAWN AEROBIC AND ANAEROBIC 7ML  Final   Culture  Setup Time   Final    GRAM NEGATIVE RODS ANAEROBIC BOTTLE ONLY CRITICAL RESULT CALLED TO, READ BACK BY AND VERIFIED WITH: MICHELLE WILLIAMS AT 2026 02/17/15 SDR CONFIRMED BY SFW    Culture   Final    PROTEUS MIRABILIS ANAEROBIC BOTTLE ONLY COAGULASE NEGATIVE STAPHYLOCOCCUS AEROBIC BOTTLE  ONLY POSSIBLE CONTAMINATION WITH SKIN FLORA    Report Status PENDING  Incomplete   Organism ID, Bacteria PROTEUS MIRABILIS  Final      Susceptibility   Proteus mirabilis - MIC*    AMPICILLIN >=32 RESISTANT Resistant     CEFTAZIDIME <=1 SENSITIVE Sensitive     CEFAZOLIN 8 SENSITIVE Sensitive     CEFTRIAXONE <=1 SENSITIVE Sensitive     CIPROFLOXACIN <=0.25 SENSITIVE Sensitive     GENTAMICIN >=16 RESISTANT Resistant     IMIPENEM 8 INTERMEDIATE Intermediate     TRIMETH/SULFA <=20 SENSITIVE Sensitive     * PROTEUS MIRABILIS  Blood culture (routine x 2)     Status: None   Collection Time: 02/17/15  2:53 AM  Result Value Ref Range Status   Specimen Description BLOOD LEFT HAND  Final   Special Requests BOTTLES DRAWN AEROBIC AND ANAEROBIC 7ML  Final   Culture  Setup Time   Final    GRAM NEGATIVE RODS AEROBIC BOTTLE ONLY CRITICAL RESULT CALLED TO, READ BACK BY AND VERIFIED WITH: MICHELLE WILLIAMS @0451  02/18/15 BY AJO CONFIRMED BY BGB    Culture PROTEUS MIRABILIS AEROBIC BOTTLE ONLY   Final   Report Status 02/22/2015 FINAL  Final   Organism ID, Bacteria PROTEUS MIRABILIS  Final      Susceptibility   Proteus mirabilis - MIC*    AMPICILLIN >=32 RESISTANT Resistant     CEFTAZIDIME <=1 SENSITIVE Sensitive     CEFAZOLIN 8 SENSITIVE Sensitive     CEFTRIAXONE <=1 SENSITIVE Sensitive     CIPROFLOXACIN <=0.25 SENSITIVE Sensitive     GENTAMICIN >=16 RESISTANT Resistant     IMIPENEM 2 SENSITIVE Sensitive     TRIMETH/SULFA <=20 SENSITIVE Sensitive     * PROTEUS MIRABILIS  MRSA PCR Screening     Status: Abnormal   Collection Time: 02/17/15  8:45 AM  Result Value Ref Range Status   MRSA by PCR POSITIVE (A) NEGATIVE Final    Comment:        The GeneXpert MRSA Assay (FDA approved for NASAL specimens only), is one component of a comprehensive MRSA colonization surveillance program. It is not intended to diagnose MRSA infection nor to guide or monitor treatment for MRSA  infections. CRITICAL RESULT CALLED TO, READ BACK BY AND VERIFIED WITH: STACY COLLIE @ [phone] 02/17/15 BGB   C difficile quick scan w PCR reflex ([facility] only)     Status: None   Collection Time: 02/18/15 11:39 AM  Result Value  Ref Range Status   C Diff antigen NEGATIVE  Final   C Diff toxin NEGATIVE  Final   C Diff interpretation Negative for C. difficile  Final    Medical History: Past Medical History  Diagnosis Date  . Chronic systolic congestive heart failure   . BPH (benign prostatic hyperplasia)   . Atrial fibrillation   . Essential hypertension   . Gastroesophageal reflux disease     Medications:  Scheduled:  . amiodarone  200 mg Oral Daily  . ciprofloxacin  400 mg Intravenous Q12H  . collagenase   Topical Daily  . heparin  5,000 Units Subcutaneous 3 times per day  . magnesium oxide  400 mg Oral Daily  . pantoprazole  40 mg Oral QAC breakfast  . tamsulosin  0.4 mg Oral Daily   Infusions:  . 0.9 % NaCl with KCl 20 mEq / L 125 mL/hr at 02/23/15 1326    Assessment: Patient is a 81 yo male admitted for pneumonia.  Patient previously on Zosyn and Vancomycin for empiric treatment.  MD initiated Ciprofloxacin 200 mg IV q12h for treatment of Proteus Mirabilis bacteremia.  Pharmacy consulted to renally adjust antibiotics as patient admitted with ARF.   Plan:  Will continue patient to Ciprofloxacin 400 mg IV q12h as per renal function Pharmacy will continue to follow.   Bari Mantis PharmD Clinical Pharmacist 02/23/2015

## 2015-02-23 NOTE — Progress Notes (Signed)
MEDICATION RELATED CONSULT NOTE - FOLLOW UP   Pharmacy Consult for Electrolyte Management   Allergies  Allergen Reactions  . Oxycodone Other (See Comments)    "makes me loopy"  . Neomycin-Bacitracin Zn-Polymyx Rash    Patient Measurements: Height: 6\' 2"  (188 cm) Weight: 168 lb 14 oz (76.6 kg) IBW/kg (Calculated) : 82.2 Adjusted Body Weight: n/a  Vital Signs: Temp: 98 F (36.7 C) (07/13 1143) Temp Source: Oral (07/13 1143) BP: 92/56 mmHg (07/13 1143) Pulse Rate: 94 (07/13 1143) Intake/Output from previous day: 07/12 0701 - 07/13 0700 In: 2691.7 [I.V.:1491.7; IV Piggyback:1200] Out: 1400 [Urine:1400] Intake/Output from this shift: Total I/O In: 500 [P.O.:500] Out: 445 [Urine:445]  Labs:  Recent Labs  02/21/15 0550 02/22/15 0559 02/23/15 0515  WBC 9.3 9.5  --   HGB 13.1 13.1  --   HCT 40.0 40.2  --   PLT 141* 139*  --   CREATININE 0.82 0.67 0.70  MG 1.9 1.7 1.9  PHOS 2.5 2.8  --    Estimated Creatinine Clearance: 82.5 mL/min (by C-G formula based on Cr of 0.7).   BMP Latest Ref Rng 02/23/2015 02/22/2015 02/21/2015  Glucose 65 - 99 mg/dL 99 75 88  BUN 6 - 20 mg/dL 13 15 19   Creatinine 0.61 - 1.24 mg/dL 1.610.70 0.960.67 0.450.82  Sodium 135 - 145 mmol/L 140 142 143  Potassium 3.5 - 5.1 mmol/L 3.8 3.3(L) 3.5  Chloride 101 - 111 mmol/L 108 108 109  CO2 22 - 32 mmol/L 26 29 28   Calcium 8.9 - 10.3 mg/dL 7.4(L) 7.9(L) 7.4(L)      Medications:  Scheduled:  . amiodarone  200 mg Oral Daily  . ciprofloxacin  400 mg Intravenous Q12H  . collagenase   Topical Daily  . heparin  5,000 Units Subcutaneous 3 times per day  . magnesium oxide  400 mg Oral Daily  . pantoprazole  40 mg Oral QAC breakfast  . tamsulosin  0.4 mg Oral Daily    Assessment: 81 yo male patient receiving NS with 20 mEq of Potassium Chloride at 15125mL/hr. Magnesium 400 mg po daily added by cardiology.  Goal of Therapy:  Normalization of electrolytes  Plan:   Electrolytes WNL. Will obtain follow-up  electrolytes with am labs.   Pharmacy will continue to monitor and adjust per consult.    Bari MantisKristin Elliannah Wayment PharmD Clinical Pharmacist 02/23/2015

## 2015-02-23 NOTE — Progress Notes (Signed)
Patient's daughter stated this morning about 1030 she had noted reddened rash area to patient's arms. Stated "it wasn't there yesterday". Pt stated itching "some before, but not now". Rash noted to bilateral forearms and just above elbows. Warm to touch. No rash area noted to chest, abdomen, legs. Notified Adrienne, Charity fundraiserN. Earnstine RegalAshley Samyia Motter, RN, [facility] nursing instructor.

## 2015-02-23 NOTE — Care Management (Signed)
Patient's modified barium swallow study showed significant dysphagia and on dysphagia diet.  Patient was transferred to 2A 02/22/2015.    Had run of non sustained Vtach .  Is on Amiodarone orally, IV antibiotics and IVF.  It is [facility].

## 2015-02-23 NOTE — Progress Notes (Signed)
[facility]  SUBJECTIVE: Called to see regarding 6 beat run of wide complex tachycardia. Strips suggests non sustained vt vs abarant afib. telemetry currently afib with controlled vr   Filed Vitals:   02/22/15 2207 02/23/15 0035 02/23/15 0558 02/23/15 0833  BP: 94/57 95/57 90/70  109/63  Pulse: 106 74 88 100  Temp: 98.3 F (36.8 C)  98.6 F (37 C) 97.8 F (36.6 C)  TempSrc:    Oral  Resp: 19  19 18   Height:      Weight:      SpO2: 100%   100%    Intake/Output Summary (Last 24 hours) at 02/23/15 1015 Last data filed at 02/23/15 0916  Gross per 24 hour  Intake 2891.67 ml  Output    600 ml  Net 2291.67 ml    LABS: Basic Metabolic Panel:  Recent Labs  16/05/9606/11/16 0550 02/22/15 0559 02/23/15 0515  NA 143 142 140  K 3.5 3.3* 3.8  CL 109 108 108  CO2 28 29 26   GLUCOSE 88 75 99  BUN 19 15 13   CREATININE 0.82 0.67 0.70  CALCIUM 7.4* 7.9* 7.4*  MG 1.9 1.7 1.9  PHOS 2.5 2.8  --    Liver Function Tests: No results for input(s): AST, ALT, ALKPHOS, BILITOT, PROT, ALBUMIN in the last 72 hours. No results for input(s): LIPASE, AMYLASE in the last 72 hours. CBC:  Recent Labs  02/21/15 0550 02/22/15 0559  WBC 9.3 9.5  HGB 13.1 13.1  HCT 40.0 40.2  MCV 100.0 99.0  PLT 141* 139*   Cardiac Enzymes: No results for input(s): CKTOTAL, CKMB, CKMBINDEX, TROPONINI in the last 72 hours. BNP: Invalid input(s): POCBNP D-Dimer: No results for input(s): DDIMER in the last 72 hours. Hemoglobin A1C: No results for input(s): HGBA1C in the last 72 hours. Fasting Lipid Panel: No results for input(s): CHOL, HDL, LDLCALC, TRIG, CHOLHDL, LDLDIRECT in the last 72 hours. Thyroid Function Tests: No results for input(s): TSH, T4TOTAL, T3FREE, THYROIDAB in the last 72 hours.  Invalid input(s): FREET3 Anemia Panel: No results for input(s): VITAMINB12, FOLATE, FERRITIN, TIBC, IRON, RETICCTPCT in the last 72 hours.   Physical Exam: Blood pressure  109/63, pulse 100, temperature 97.8 F (36.6 C), temperature source Oral, resp. rate 18, height 6\' 2"  (1.88 m), weight 76.6 kg (168 lb 14 oz), SpO2 100 %.  General appearance: appears older than stated age and slowed mentation Resp: diminished breath sounds bilaterally Cardio: irregularly irregular rhythm GI: soft, non-tender; bowel sounds normal; no masses,  no organomegaly Pulses: reduced pulses bilaterally Neurologic: Mental status: alertness: lethargic  TELEMETRY: Reviewed telemetry pt in afib with variable vr:  ASSESSMENT AND PLAN:  Principal Problem:   Pneumonia-continue with abx Active Problems:   ARF (acute renal failure)-   Hypernatremia   Hypotension   Blood poisoning   Pressure ulcer   Protein-calorie malnutrition, severe  afib-continue with amiodarone. Keep K greater than 4 and mg greater than 2. Will add mag ox 400 daily. Will follow for further arrythmia. Conintue with amiodarone for now at current dose.   Dalia HeadingFATH,Ronald Siglin A., MD, [facility] HospitalFACC 02/23/2015 10:15 AM

## 2015-02-23 NOTE — Progress Notes (Addendum)
[facility] - [place] at [facility]   PATIENT NAME: Ronald Good    MR#:  [id number]  DATE OF BIRTH:  [birth year]  SUBJECTIVE:  CHIEF COMPLAINT:   Chief Complaint  Patient presents with  . Altered Mental Status   Feels good. Confused today . Wants to walk, less of pain in lower extremities overall. Status post angiogram 11th of July 2016 revealing peripheral vascular disease in left lower extremity, status post PTA.  Modified Barium swallow study revealed significant dysphagia. Now on dysphagia diet. Dr. Wyn Quaker recommends right lower extremity angiogram later this week or early next week. Nonsustained V. tach earlier today. 6 beats seen by Dr. Lady Gary who recommended amiodarone  REVIEW OF SYSTEMS:  Complains of some lower abdominal discomfort pains as well as some lower extremity pains that seem to be subsiding  DRUG ALLERGIES:   Allergies  Allergen Reactions  . Oxycodone Other (See Comments)    "makes me loopy"  . Neomycin-Bacitracin Zn-Polymyx Rash    VITALS:  Blood pressure 92/56, pulse 94, temperature 98 F (36.7 C), temperature source Oral, resp. rate 18, height  (1.88 m), weight 76.6 kg (168 lb 14 oz), SpO2 100 %.  PHYSICAL EXAMINATION:  GENERAL:  81 y.o.-year-old patient lying in the bed, more  responsive today is able to converse , following commands intermittently  EYES: Pupils equal, round, reactive to light and accommodation. No scleral icterus. Extraocular muscles intact.  HEENT: Head atraumatic, normocephalic. Oropharynx and nasopharynx clear. More moist . Oral mucosa. NECK:  Supple, no jugular venous distention. No thyroid enlargement, no tenderness.  LUNGS: Normal breath sounds bilaterally, no wheezing, rales,rhonchi or crepitation,better inspiratory  effort. No use of accessory muscles of respiration.  CARDIOVASCULAR: S1, S2 normal. No murmurs, rubs, or gallops.  ABDOMEN: Soft, tender in lower abdomen, mostly suprapubically on the right side,  nondistended. Bowel sounds present. No organomegaly or mass. Significant discomfort on palpation in suprapubic area but no rebound or guarding was noted  EXTREMITIES: trace pedal edema, open wounds were noted in the anterior shins in lower part, especially on the left with some necrotic areas of the toes and metacarpal phalangeal area. Extensive ulcerations in the anterior aspect of the right lower extremity. dressing is applied. No infection signs were noted. Presence of necrosis on the right side as well . Peripheral pulses are nonpalpable.  Severe pain on manipulation of the patient's feet NEUROLOGIC: Cranial nerves II through XII are intact. Muscle strength 3/5 in all extremities. Sensation intact. Gait not checked.  PSYCHIATRIC: The patient is somnolent , confused.  SKIN: Sacral DU stage 2. Leg wound in dressing. Poor turgor.   LABORATORY PANEL:   CBC  Recent Labs Lab 02/22/15 0559  WBC 9.5  HGB 13.1  HCT 40.2  PLT 139*   ------------------------------------------------------------------------------------------------------------------  Chemistries   Recent Labs Lab 02/17/15 0214  02/23/15 0515  NA 149*  < > 140  K 4.7  < > 3.8  CL 107  < > 108  CO2 29  < > 26  GLUCOSE 221*  < > 99  BUN 91*  < > 13  CREATININE 3.20*  < > 0.70  CALCIUM 8.7*  < > 7.4*  MG  --   < > 1.9  AST 131*  --   --   ALT 51  --   --   ALKPHOS 197*  --   --   BILITOT 2.0*  --   --   < > = values in this  interval not displayed. ------------------------------------------------------------------------------------------------------------------  Cardiac Enzymes  Recent Labs Lab 02/17/15 0214  TROPONINI 0.03   ------------------------------------------------------------------------------------------------------------------  RADIOLOGY:  Dg Esophagus  02/22/2015   CLINICAL DATA:  81Seventy-eight old male possible aspiration. Initial encounter.  EXAM: ESOPHOGRAM/BARIUM SWALLOW  TECHNIQUE: Single  contrast examination was performed using  thin barium.  FLUOROSCOPY TIME:  Radiation Exposure Index (as provided by the fluoroscopic device): 493.05 micro Gy cm2  COMPARISON:  None.  FINDINGS: The present examination was performed in a limited fashion given patient's inability to move. Patient was not able to be placed on table secondary to tachycardia and therefore imaging performed in sitting position and only the proximal and mid esophagus were able to be adequately assessed during modified swallowing exam. Exam requested by (and results discussed by telephone with) Dr. Winona LegatoVaickute  Flash laryngeal penetration without aspiration noted.  Poor esophageal motility with presbyesophagus. Delayed emptying of ingested contents.  IMPRESSION: Only the proximal and mid aspect of the thoracic esophagus were able to be evaluated. Prominent presbyesophagus with delayed emptying of ingested contents.   Electronically Signed   By: Lacy DuverneySteven  Olson M.D.   On: 02/22/2015 15:03    EKG:   Orders placed or performed during the hospital encounter of 02/17/15  . EKG 12-Lead  . EKG 12-Lead    ASSESSMENT AND PLAN:   HAP and Proteus mirabilis septicemia. Now off vancomycin and only on ciprofloxacin IV,  remains afebrile , but blood pressure has worsened despite IV fluid administration at high rate, white blood cell count was normal yesterday  Septic shock.  Tapered off levophed drip. Off  Lasix, toprol and metolazone for now. On high rate IV fluids. Unfortunately, patient's blood pressure is trending down again  ARF. Status post NS iv bolus and IV fluid, continued administration. Kidney function has improved.  Follow up BMP. Hold lasix, toprol and metolazone. Kidney US: no obstruction.   Hypernatremia. Na 140 today. Continue  saline and D5 water. Patient's urine output is  good , but urine color remains dark. In addition, patient has dysphagia and he will be on dysphagia diet with thickened liquids, unfortunately predisposing  him to dehydration.   Hypokalemia. , Resolved  with KCl in IV fluids. F/u BMP tomorrow. Dr. Allena EaringFabry recommended to keep, but patient's potassium level above 4 adding potassium supplement orally   Lactic acidosis. improved  Sacral decubitus ulcer stage 2 and  leg wounds. Wound care appreciated.Appreciate vascular  surgery involvement , s/p Angiogram on left lower extremity 02/21/2015 planned right lower extremity later this week or early next week. Following healing.   AFib.Continue amiodarone and resume pradaxa after procedures are performed  Chronic systolic CHF. Stable with IV fluid administration, DC IV fluids when patient is able to take orally. Confusion of unclear etiology at this time, hopefully delirium related to medications, insomnia, etc. add Haldol as needed every 6 hours and follow his condition Suprapubic abdominal pain of unclear etiology, get CT scan of abdomen and pelvis to rule out abscess structural abnormalities, obstruction, etc. continue antibiotic therapy with ciprofloxacin at present, may need to change to medications involving anaerobic therapy depending on the findings Episode of V. tach, continue amiodarone per Dr. America BrownFath's recommendations, keeping patient's electrolytes and normal levels   All the records are reviewed and case discussed with Care Management/Social Workerr. Management plans discussed with the patient, family and they are in agreement.  CODE STATUS: FULL CODE (I discussed with patient's daughter yesterday. Her sister is POA. Full code for now.)  TOTAL CRITICAL  TIME TAKING CARE OF THIS PATIENT: 40 minutes.   Discussion with  patient's wife as well as his  daughter, questions answered. Katharina Caper M.D on 02/23/2015 at 1:00 PM  Between 7am to 6pm - Pager - [phone]  After 6pm go to [email or web address] - password [email or web address] [facility]  [place] [facility]  Office  [phone]  CC: Primary care physician; Marisue Ivan, MD

## 2015-02-24 ENCOUNTER — Encounter: Payer: Self-pay | Admitting: Vascular Surgery

## 2015-02-24 LAB — MAGNESIUM: Magnesium: 1.7 mg/dL (ref 1.7–2.4)

## 2015-02-24 LAB — CULTURE, BLOOD (ROUTINE X 2)

## 2015-02-24 LAB — CBC
HCT: 36.5 % — ABNORMAL LOW (ref 40.0–52.0)
Hemoglobin: 12.1 g/dL — ABNORMAL LOW (ref 13.0–18.0)
MCH: 32.6 pg (ref 26.0–34.0)
MCHC: 33.1 g/dL (ref 32.0–36.0)
MCV: 98.4 fL (ref 80.0–100.0)
Platelets: 190 10*3/uL (ref 150–440)
RBC: 3.71 MIL/uL — ABNORMAL LOW (ref 4.40–5.90)
RDW: 17.2 % — AB (ref 11.5–14.5)
WBC: 9.3 10*3/uL (ref 3.8–10.6)

## 2015-02-24 LAB — AMMONIA: AMMONIA: 13 umol/L (ref 9–35)

## 2015-02-24 LAB — POTASSIUM: Potassium: 3.8 mmol/L (ref 3.5–5.1)

## 2015-02-24 MED ORDER — SODIUM CHLORIDE 0.9 % IJ SOLN
10.0000 mL | INTRAMUSCULAR | Status: DC | PRN
  Administered 2015-02-24: 30 mL
  Filled 2015-02-24: qty 40

## 2015-02-24 MED ORDER — KCL IN DEXTROSE-NACL 20-5-0.45 MEQ/L-%-% IV SOLN
INTRAVENOUS | Status: DC
Start: 2015-02-24 — End: 2015-03-04
  Administered 2015-02-24 – 2015-03-03 (×8): via INTRAVENOUS
  Filled 2015-02-24 (×11): qty 1000

## 2015-02-24 MED ORDER — POTASSIUM CHLORIDE 20 MEQ PO PACK
40.0000 meq | PACK | Freq: Once | ORAL | Status: AC
  Administered 2015-02-24: 40 meq via ORAL
  Filled 2015-02-24: qty 2

## 2015-02-24 MED ORDER — MAGNESIUM SULFATE 2 GM/50ML IV SOLN
2.0000 g | Freq: Once | INTRAVENOUS | Status: AC
  Administered 2015-02-24: 2 g via INTRAVENOUS
  Filled 2015-02-24: qty 50

## 2015-02-24 NOTE — Progress Notes (Addendum)
[facility] - [place] at [facility]   PATIENT NAME: Ronald Good    MR#:  [id number]  DATE OF BIRTH:  [birth year]  SUBJECTIVE:  CHIEF COMPLAINT:   Chief Complaint  Patient presents with  . Altered Mental Status   Feels good. Confused today . Wants to walk, less of pain in lower extremities overall. Status post angiogram 11th of July 2016 revealing peripheral vascular disease in left lower extremity, status post PTA.  Modified Barium swallow study revealed significant dysphagia. Now on dysphagia diet. Dr. Wyn Quaker recommends right lower extremity angiogram later this week or early next week. Nonsustained V. tach earlier yesterday but not today. 6 beats seen by Dr. Lady Gary who recommended amiodarone. Mental status has improved and less confusion today  REVIEW OF SYSTEMS:  Complains of some lower abdominal discomfort pains as well as some lower extremity pains that seem to be subsiding  DRUG ALLERGIES:   Allergies  Allergen Reactions  . Oxycodone Other (See Comments)    "makes me loopy"  . Neomycin-Bacitracin Zn-Polymyx Rash    VITALS:  Blood pressure 97/60, pulse 85, temperature 98.2 F (36.8 C), temperature source Axillary, resp. rate 18, height  (1.88 m), weight 75.342 kg (166 lb 1.6 oz), SpO2 100 %.  PHYSICAL EXAMINATION:  GENERAL:  81 y.o.-year-old patient lying in the bed, more  responsive today is able to converse , following commands  EYES: Pupils equal, round, reactive to light and accommodation. No scleral icterus. Extraocular muscles intact.  HEENT: Head atraumatic, normocephalic. Oropharynx and nasopharynx clear. More moist . Oral mucosa. NECK:  Supple, no jugular venous distention. No thyroid enlargement, no tenderness.  LUNGS: Normal breath sounds bilaterally, no wheezing, rales,rhonchi or crepitation,better inspiratory  effort. No use of accessory muscles of respiration.  CARDIOVASCULAR: S1, S2 normal. No murmurs, rubs, or gallops.  ABDOMEN: Soft,  tender in lower abdomen, mostly suprapubically on the right side, nondistended. Bowel sounds present. No organomegaly or mass. Significant discomfort on palpation in suprapubic area but no rebound or guarding was noted  EXTREMITIES: trace pedal edema, open wounds were noted in the anterior shins in lower part, Extensive ulcerations in the anterior aspect of the right lower extremity. No significant drainage dressing has been changed. No infection signs were noted.   Severe pain on manipulation of the patient's feet. Good peripheral pulses on the left. Diminished on the right NEUROLOGIC: Cranial nerves II through XII are intact. Muscle strength 3/5 in all extremities. Sensation intact. Gait not checked.  PSYCHIATRIC: The patient is alert, .  SKIN: Sacral DU stage 2. Leg wound in dressing. Poor turgor.   LABORATORY PANEL:   CBC  Recent Labs Lab 02/24/15 0214  WBC 9.3  HGB 12.1*  HCT 36.5*  PLT 190   ------------------------------------------------------------------------------------------------------------------  Chemistries   Recent Labs Lab 02/23/15 1619 02/24/15 0214  NA 143  --   K 3.7 3.8  CL 115*  --   CO2 24  --   GLUCOSE 120*  --   BUN 13  --   CREATININE 0.79  --   CALCIUM 7.4*  --   MG  --  1.7  AST 72*  --   ALT 34  --   ALKPHOS 224*  --   BILITOT 1.4*  --    ------------------------------------------------------------------------------------------------------------------  Cardiac Enzymes No results for input(s): TROPONINI in the last 168 hours. ------------------------------------------------------------------------------------------------------------------  RADIOLOGY:  Ct Abdomen Pelvis Wo Contrast  02/23/2015   CLINICAL DATA:  Suprapubic abdominal pain. History of chronic  systolic congestive heart failure, atrial fibrillation, hypertension and BPH. Acute renal failure. Initial encounter.  EXAM: CT ABDOMEN AND PELVIS WITHOUT CONTRAST  TECHNIQUE:  Multidetector CT imaging of the abdomen and pelvis was performed following the standard protocol without IV contrast.  COMPARISON:  Abdominal pelvic CT 07/27/2014.  FINDINGS: Lower chest: New small dependent right-greater-than-left pleural effusions with patchy bibasilar pulmonary opacities, most likely atelectasis. The heart is enlarged. There is diffuse atherosclerosis of the aorta and coronary arteries.  Hepatobiliary: Stable mild contour irregularity of the liver which may reflect cirrhosis. No focal lesions identified on noncontrast imaging. Previous cholecystectomy. No evidence of biliary dilatation.  Pancreas: Atrophied without focal abnormality or surrounding inflammation.  Spleen: Normal in size without focal abnormality.  Adrenals/Urinary Tract: Both adrenal glands appear normal.The right kidney appears normal. 4.6 cm intermediate density lesion projecting from the lower pole of the left kidney has slightly enlarged, but is likely a cyst. There is no hydronephrosis or asymmetric perinephric soft tissue stranding. However, the previously demonstrated left renal calculi are now present within the distal left ureter. These are best seen on coronal image number 91, each measuring approximately 9 mm in diameter. Foley catheter is in place. The bladder is partially decompressed and contains a small amount of air.  Stomach/Bowel: No evidence of bowel wall thickening, distention or surrounding inflammatory change.Mild sigmoid colon diverticular changes.  Vascular/Lymphatic: Extensive atherosclerosis of the aorta, its branches and the iliac arteries status post aorto bi-iliac stenting. Bilateral iliac artery aneurysms are grossly stable. No evidence of retroperitoneal hematoma or lymphadenopathy.  Reproductive: Unremarkable.  Other: Stable postsurgical changes within the lower anterior abdominal wall with stable prominent fat in both inguinal canals.  Musculoskeletal: No acute or significant osseous findings.  Previous left hip pinning. There is diffuse thoracolumbar spondylosis.  IMPRESSION: 1. Interval passage of previously demonstrated left renal calculi into the distal left ureter. There is no significant hydronephrosis or perinephric soft tissue stranding, presumably secondary to underlying impaired renal function. 2. Bladder decompressed by Foley catheter. 3. Stable postsurgical changes within the low anterior abdominal wall with prominent fat in both inguinal canals. 4. Grossly stable diffuse atherosclerosis status post aortoiliac stenting. 5. New bilateral pleural effusions with associated bibasilar atelectasis.   Electronically Signed   By: Carey BullocksWilliam  Veazey M.D.   On: 02/23/2015 15:17   Dg Esophagus  02/22/2015   CLINICAL DATA:  22Seventy-eight old male possible aspiration. Initial encounter.  EXAM: ESOPHOGRAM/BARIUM SWALLOW  TECHNIQUE: Single contrast examination was performed using  thin barium.  FLUOROSCOPY TIME:  Radiation Exposure Index (as provided by the fluoroscopic device): 493.05 micro Gy cm2  COMPARISON:  None.  FINDINGS: The present examination was performed in a limited fashion given patient's inability to move. Patient was not able to be placed on table secondary to tachycardia and therefore imaging performed in sitting position and only the proximal and mid esophagus were able to be adequately assessed during modified swallowing exam. Exam requested by (and results discussed by telephone with) Dr. Winona LegatoVaickute  Flash laryngeal penetration without aspiration noted.  Poor esophageal motility with presbyesophagus. Delayed emptying of ingested contents.  IMPRESSION: Only the proximal and mid aspect of the thoracic esophagus were able to be evaluated. Prominent presbyesophagus with delayed emptying of ingested contents.   Electronically Signed   By: Lacy DuverneySteven  Olson M.D.   On: 02/22/2015 15:03    EKG:   Orders placed or performed during the hospital encounter of 02/17/15  . EKG 12-Lead  . EKG 12-Lead  ASSESSMENT AND PLAN:   HAP and Proteus mirabilis septicemia. Now off vancomycin and only on ciprofloxacin IV,  remains afebrile , but blood pressure intermittently low, continue low rate IV fluids, white blood cell count was normal 3 days ago  Septic shock.  Tapered off levophed drip. Off  Lasix, toprol and metolazone for now. Continue IV fluids.  ARF. Status post NS iv bolus and IV fluid, continued administration. Kidney function has improved.  Follow up BMP. Hold lasix, toprol and metolazone. Kidney US: no obstruction.   Hypernatremia. Na 140 yesterday. Continue  saline and D5 water at lower rate following his oral intake, which is better. Patient's urine output is  good , but urine color remains dark. In addition, patient has dysphagia and he will be on dysphagia diet with thickened liquids, unfortunately predisposing him to dehydration.   Hypokalemia. , Resolved  with KCl in IV fluids. F/u BMP tomorrow. Dr. Lady Gary recommended to keep patient's potassium level above 4 adding potassium supplement orally   Lactic acidosis. Adult  Sacral decubitus ulcer stage 2 and  leg wounds. Wound care appreciated.Appreciate vascular  surgery involvement , s/p Angiogram on left lower extremity 02/21/2015 planned right lower extremity later this week or early next week. Following healing, seemed to be appropriate. Appreciate wound care input.   AFib with episodes of short to V. tach few days ago.Continue amiodarone and resume pradaxa after procedures are performed  Chronic systolic CHF. Stable with IV fluid administration, DC IV fluids when patient is able to take orally. Confusion , stability as patient's confusion seemed to have fluctuating,  better today on Haldol as needed every 6 hours and follow his condition Suprapubic abdominal pain  CT scan of abdomen and pelvis showed no abscess or structural abnormalities, obstruction, etc. continue antibiotic therapy with ciprofloxacin at present Episode of V.  tach, continue amiodarone per Dr. America Brown recommendations, keeping patient's electrolytes and normal levels   All the records are reviewed and case discussed with Care Management/Social Workerr. Management plans discussed with the patient, family and they are in agreement.  CODE STATUS: FULL CODE (I discussed with patient's daughter yesterday. Her sister is POA. Full code for now.)  TOTAL CRITICAL TIME TAKING CARE OF THIS PATIENT: 35 minutes.   Discussion with  patient's daughter at the bedside, questions answered. Katharina Caper M.D on 02/24/2015 at 2:59 PM  Between 7am to 6pm - Pager - [phone]  After 6pm go to [email or web address] - password [email or web address] [facility]  [place] [facility]  Office  [phone]  CC: Primary care physician; Marisue Ivan, MD

## 2015-02-24 NOTE — Evaluation (Signed)
Objective Swallowing Evaluation:  (MBSS)  Patient Details  Name: Ronald Good MRN: [id number] Date of Birth: 24-Jan-1934  Today's Date: 02/24/2015 Time: SLP Start Time (ACUTE ONLY): 1400-SLP Stop Time (ACUTE ONLY): 1500 SLP Time Calculation (min) (ACUTE ONLY): 60 min  Past Medical History:  Past Medical History  Diagnosis Date  . Chronic systolic congestive heart failure   . BPH (benign prostatic hyperplasia)   . Atrial fibrillation   . Essential hypertension   . Gastroesophageal reflux disease    Past Surgical History:  Past Surgical History  Procedure Laterality Date  . Replacement total knee bilateral Bilateral    Subjective: Patient behavior: (alertness, ability to follow instructions, etc.): pt awake, alert. Uncomfortable, especially his feet/toes.  Chief complaint: dysphagia. Toleration of po diet is inconsistent, per report.    Objective:  Radiological Procedure: A videoflouroscopic evaluation of oral-preparatory, reflex initiation, and pharyngeal phases of the swallow was performed; as well as a screening of the upper esophageal phase.  I. POSTURE: upright II. VIEW: lateral III. COMPENSATORY STRATEGIES: multiple swallows; alternate food w/ liquid; small(1/2) tsp boluses. IV. BOLUSES ADMINISTERED:  Thin Liquid: NT   Nectar-thick Liquid: 3 tsp trials  Honey-thick Liquid: 3 tsp trials  Puree: 3 tsp trials  Mechanical Soft: NT V. RESULTS OF EVALUATION: A. ORAL PREPARATORY PHASE: (The lips, tongue, and velum are observed for strength and coordination)       **Overall Severity Rating: MODERATE. Pt exhibited decrease bolus control of liquid boluses w/ premature spillage of thickened liquids from the oral cavity. Min. Oral residue remained w/ all trials; decreased lingual effort for lingual sweeping to clear fully.   B. SWALLOW INITIATION/REFLEX: (The reflex is normal if "triggered" by the time the bolus reached the base of the tongue)  **Overall Severity Rating:  MODERATE-SEVERE. Pt exhibited a delayed pharyngeal swallow initiation w/ all trials spilling from the valleculae to the pyriform sinuses b/f pharyngeal swallow was initiated; Nectar consistency liquids triggered swallow once in the pyriform sinuses; Honey liquids triggered while spilling to the pyriform sinuses.   C. PHARYNGEAL PHASE: (Pharyngeal function is normal if the bolus shows rapid, smooth, and continuous transit through the pharynx and there is no pharyngeal residue after the swallow)  **Overall Severity Rating: MODERATE-SEVERE. Pt exhibited pharyngeal residue throughout the pharynx w/ all trials tested indicating decreased laryngeal excursion/elevation and decreased pharyngeal pressure during the swallow. Attempted use of strategies of verbal cues to use multiple swallows then alternating food/liquid did not greatly decrease the pharyngeal residue. Maintained use of small boluses and time b/t trials.   D. LARYNGEAL PENETRATION: (Material entering into the laryngeal inlet/vestibule but not aspirated): laryngeal penetration occurred 3/3 trials w/ Nectar liquids; also apparent buildup of pharyngeal residue moved into the laryngeal vestibule as penetration.   E. ASPIRATION: No immediate aspiration appeared to occur immediately during/post swallowing; high risk for bolus material that penetrated into the laryngeal vestibule to move below the vocal cords. F. ESOPHAGEAL PHASE: (Screening of the upper esophagus): slow motility of bolus material throughout the upper Esophagus w/ retrograde activity occuring intermittently(w/ trials of puree).   ASSESSMENT: Pt completed MBSS today which revealed moderate-severe oropharyngeal phase dysphagia; Esophageal phase dysphagia and dysmotility. Pt exhibited a delayed pharyngeal swallow initiation w/ liquid trials and increased pharyngeal residue of food/liquid trials which resulted in laryngeal penetration during this study; this greatly increases risk for aspiration  to follow from the buildup of laryngeal penetration also. Pt appeared able to tolerate trials of Honey consistency liquids and purees following strict aspiration  precautions, although, pt is at risk for aspiration as well as potential for decreased hydration d/t the need for Honey consistency liquids. MD consulted re: pt's presentation, risk for aspiration and rec. diet consistency; MD agreed; NSG updated. ST will f/u w/ toleration of diet; education.   PLAN/RECOMMENDATIONS:  A. Diet: Dys. I, Honey liquids  B. Swallowing Precautions: strict aspiration precautions  C. Recommended consultation to MD; NSG; pt  D. Therapy recommendations: diet toleration  E. Results and recommendations were MD; NSG; Dietician  HPI:  Other Pertinent Information: Pt c/o "trouble swallowing" w/ meals last admission and was assessed by ST services then. Pt explained that he feels food "stops here" pointing to his lower throat area; he stated "it's trying to come back up sometimes". Pt acknowledged he has "phlegm". He was not aware of any dx of reflux or medication for his stomach(pt does have Pantoprazole prescribed per chart note). Noted wounds on his LEs. During this admission, pt has exhibited overt s/s of aspiration w/ po's including coughing w/ liquids. He had been on a Nectar consistency diet but this was recently downgraded to Honey consistency at bedside d/t intermittent coughing when drinking per NSG.   No Data Recorded  Assessment / Plan / Recommendation CHL IP CLINICAL IMPRESSIONS 02/24/2015  Therapy Diagnosis Severe pharyngeal phase dysphagia;Moderate cervical esophageal phase dysphagia;Moderate oral phase dysphagia  Clinical Impression (None)      CHL IP TREATMENT RECOMMENDATION 02/24/2015  Treatment Recommendations Therapy as outlined in treatment plan below     CHL IP DIET RECOMMENDATION 02/24/2015  SLP Diet Recommendations Dysphagia 1 (Puree);Honey  Liquid Administration via (None)  Medication  Administration Crushed with puree  Compensations Small sips/bites;Check for pocketing;Multiple dry swallows after each bite/sip;Follow solids with liquid  Postural Changes and/or Swallow Maneuvers (None)     CHL IP OTHER RECOMMENDATIONS 02/24/2015  Recommended Consults (None)  Oral Care Recommendations Oral care BID;Oral care before and after PO;Staff/trained caregiver to provide oral care  Other Recommendations Order thickener from pharmacy;Remove water pitcher;Prohibited food (jello, ice cream, thin soups)     No flowsheet data found.   CHL IP FREQUENCY AND DURATION 02/24/2015  Speech Therapy Frequency (ACUTE ONLY) min 3x week  Treatment Duration 1 week     Pertinent Vitals/Pain Foot/toe discomfort but did not give a number; supported/repositioned    SLP Swallow Goals No flowsheet data found.  No flowsheet data found.    CHL IP REASON FOR REFERRAL 02/24/2015  Reason for Referral Objectively evaluate swallowing function     No flowsheet data found.    No flowsheet data found.    No flowsheet data found.  No flowsheet data found.        Jerilynn SomKatherine Jamill Wetmore, MS, CCC-SLP Ronald Good 02/24/2015, 3:16 PM

## 2015-02-24 NOTE — Progress Notes (Addendum)
MEDICATION RELATED CONSULT NOTE - FOLLOW UP   Pharmacy Consult for Electrolyte Management   Allergies  Allergen Reactions  . Oxycodone Other (See Comments)    "makes me loopy"  . Neomycin-Bacitracin Zn-Polymyx Rash    Patient Measurements: Height: 6\' 2"  (188 cm) Weight: 166 lb 1.6 oz (75.342 kg) IBW/kg (Calculated) : 82.2 Adjusted Body Weight: n/a  Vital Signs: Temp: 98.2 F (36.8 C) (07/14 1154) Temp Source: Axillary (07/14 1154) BP: 97/60 mmHg (07/14 1154) Pulse Rate: 85 (07/14 1154) Intake/Output from previous day: 07/13 0701 - 07/14 0700 In: 500 [P.O.:500] Out: 1545 [Urine:1545] Intake/Output from this shift:    Labs:  Recent Labs  02/22/15 0559 02/23/15 0515 02/23/15 1619 02/24/15 0214  WBC 9.5  --   --  9.3  HGB 13.1  --   --  12.1*  HCT 40.2  --   --  36.5*  PLT 139*  --   --  190  CREATININE 0.67 0.70 0.79  --   MG 1.7 1.9  --  1.7  PHOS 2.8  --   --   --   ALBUMIN  --   --  1.5*  --   PROT  --   --  5.2*  --   AST  --   --  72*  --   ALT  --   --  34  --   ALKPHOS  --   --  224*  --   BILITOT  --   --  1.4*  --    Estimated Creatinine Clearance: 81.1 mL/min (by C-G formula based on Cr of 0.79).   BMP Latest Ref Rng 02/24/2015 02/23/2015 02/23/2015  Glucose 65 - 99 mg/dL - 161(W120(H) 99  BUN 6 - 20 mg/dL - 13 13  Creatinine 9.600.61 - 1.24 mg/dL - 4.540.79 0.980.70  Sodium 119135 - 145 mmol/L - 143 140  Potassium 3.5 - 5.1 mmol/L 3.8 3.7 3.8  Chloride 101 - 111 mmol/L - 115(H) 108  CO2 22 - 32 mmol/L - 24 26  Calcium 8.9 - 10.3 mg/dL - 7.4(L) 7.4(L)      Medications:  Scheduled:  . amiodarone  200 mg Oral Daily  . ciprofloxacin  400 mg Intravenous Q12H  . collagenase   Topical Daily  . heparin  5,000 Units Subcutaneous 3 times per day  . magnesium oxide  400 mg Oral Daily  . pantoprazole  40 mg Oral QAC breakfast  . tamsulosin  0.4 mg Oral Daily    Assessment: Potassium 3.8,   Mag 1.7. Per cardiology recommendations: keep Mag>2.0 and Potassium >  4.0. Magnesium 400 mg po daily added by cardiology on 7/13. Patient also on D51/2NS with 20 meq Potassium Chloride at 50 ml/hr. Patient on Dysphagia 1 diet.  Goal of Therapy:  Normalization of electrolytes  Plan:  Will give Magnesium 2 gram IV x1 and Potassium Chloride 40 meq packet x 1.  Consider increasing PO mag-ox to bid if Magnesium still low. Will obtain follow-up electrolytes with am labs.   Pharmacy will continue to monitor and adjust per consult.    Bari MantisKristin Nikki Rusnak PharmD Clinical Pharmacist 02/24/2015

## 2015-02-24 NOTE — Progress Notes (Signed)
Attempted to call Boykin ReaperShirley Zinni (listed as spouse) to get telephone consent for RLE angiogram. Did not get an answer. Appears that test will not be until next week. Will pass along to next shift, for someone to try to call for consent tomorrow, as patient can be confused at times.  Adella NissenBailey, Cruz Bong G

## 2015-02-24 NOTE — Progress Notes (Signed)
Dr. Winona LegatoVaickute notified of patients 4 beats of SVT. Orders to recheck Magnesium and Potassium in the morning.  Adella NissenBailey, Amorie Rentz G

## 2015-02-24 NOTE — Progress Notes (Signed)
[facility]  Daily Progress Note   Subjective  - 3 Days Post-Op  Not eating.  Had lots of coughing earlier.   Legs still hurting but left leg is better No access site problems Very little mobility.  Needs PT for deconditioning.  Objective Filed Vitals:   02/23/15 2025 02/23/15 2333 02/24/15 0605 02/24/15 1154  BP: 92/58 143/116 105/69 97/60  Pulse: 114 111 93 85  Temp: 98.3 F (36.8 C) 98.1 F (36.7 C) 97.2 F (36.2 C) 98.2 F (36.8 C)  TempSrc:  Oral Oral Axillary  Resp: 22  20 18   Height:      Weight:   166 lb 1.6 oz (75.342 kg)   SpO2: 95% 100% 100%     Intake/Output Summary (Last 24 hours) at 02/24/15 1620 Last data filed at 02/24/15 1130  Gross per 24 hour  Intake      0 ml  Output   1100 ml  Net  -1100 ml    PULM  CTAB CV  RRR VASC  Good cap refill left foot.  Mild swelling.  Wounds are dressed at this time.    Laboratory CBC    Component Value Date/Time   WBC 9.3 02/24/2015 0214   WBC 9.2 07/31/2014 0611   HGB 12.1* 02/24/2015 0214   HGB 12.4* 07/31/2014 0611   HCT 36.5* 02/24/2015 0214   HCT 37.2* 07/31/2014 0611   PLT 190 02/24/2015 0214   PLT 153 07/31/2014 0611    BMET    Component Value Date/Time   NA 143 02/23/2015 1619   NA 138 08/02/2014 0424   K 3.8 02/24/2015 0214   K 3.6 08/02/2014 0424   CL 115* 02/23/2015 1619   CL 103 08/02/2014 0424   CO2 24 02/23/2015 1619   CO2 28 08/02/2014 0424   GLUCOSE 120* 02/23/2015 1619   GLUCOSE 89 08/02/2014 0424   BUN 13 02/23/2015 1619   BUN 10 08/02/2014 0424   CREATININE 0.79 02/23/2015 1619   CREATININE 0.90 08/02/2014 0424   CALCIUM 7.4* 02/23/2015 1619   CALCIUM 8.3* 08/02/2014 0424   GFRNONAA >60 02/23/2015 1619   GFRNONAA >60 03/24/2014 0532   GFRAA >60 02/23/2015 1619   GFRAA >60 03/24/2014 0532    Assessment/Planning: POD # 3 s/p LLE revascularization   Has lots of ongoing issues and is not eating.    Needs better nutrition to have a chance to heal  his wounds.  May have to consider a feeding tube if this does not improve.  Needs RLE angiogram and I have planned this for Monday.  With all of his ongoing issues, I think it would be prudent to consider keeping him here until that time.  Will clearly need facility at discharge.  [facility] benefit [facility] treatment of wounds as an outpatient.  Very complex and difficult case with aspiration, poor po intake, PAD, severe BLE wounds.  High risk of limb loss and high risk of mortality.    DEW,JASON  02/24/2015, 4:20 PM

## 2015-02-24 NOTE — Progress Notes (Addendum)
Patient resting quietly most of the day. Family at bedside for part of this shift. Dressing change by student nurse with instructor. No complaints today. RLE angiogram scheduled for Monday per Dr. Driscilla Grammesew's note.   Adella NissenBailey, Jessieca Rhem G

## 2015-02-24 NOTE — Progress Notes (Signed)
Nutrition Follow-up  DOCUMENTATION CODES:   Severe malnutrition in context of acute illness/injury  INTERVENTION:   Medical Food Supplement Therapy: continue Honey Thick Mighty Shakes and Magic Cup TID Meals/Snacks: cater to pt preferences   NUTRITION DIAGNOSIS:   Inadequate oral intake related to acute illness as evidenced by per patient/family report. Continues  GOAL:   Patient will meet greater than or equal to 90% of their needs  MONITOR:   PO intake (Energy Intake, Anthropometrics, Digestive System, Electrolyte/Renal Profile)  ASSESSMENT:    Pt s/p angiogram of left LE, plan for angiogram of right LE last this week or next week. MBSS with significant  Dysphagia. Pt alert, confused  Diet Order:  DIET - DYS 1 Room service appropriate?: Yes; Fluid consistency:: Honey Thick    Current Nutrition: Pt ate bites of applesauce and drank some thickened liquids this AM, otherwise nothing eaten off of breakfast tray on visit this AM. Family reporting pt eating bites/sips,they report pt did eat 100% of Magic Cup at [employer] yesterday. Limited documentation of po intake in computer, noted pt is on Isolation. Documentation of 50% at breakfast yesterday   Medications: reviewed  Electrolyte/Renal Profile and Glucose Profile:   Recent Labs Lab 02/20/15 0510 02/21/15 0550 02/22/15 0559 02/23/15 0515 02/23/15 1619 02/24/15 0214  NA 146* 143 142 140 143  --   K 3.3* 3.5 3.3* 3.8 3.7 3.8  CL 110 109 108 108 115*  --   CO2 31 28 29 26 24   --   BUN 26* 19 15 13 13   --   CREATININE 0.99 0.82 0.67 0.70 0.79  --   CALCIUM 7.6* 7.4* 7.9* 7.4* 7.4*  --   MG 1.6* 1.9 1.7 1.9  --  1.7  PHOS 2.8 2.5 2.8  --   --   --   GLUCOSE 98 88 75 99 120*  --    **Corrected calcium 9.4 (wdl)  Protein Profile:  Recent Labs Lab 02/23/15 1619  ALBUMIN 1.5*     Weight Trend since Admission: Filed Weights   02/17/15 0224 02/19/15 1600 02/24/15 0605  Weight: 168 lb (76.204 kg) 168 lb 14 oz  (76.6 kg) 166 lb 1.6 oz (75.342 kg)      Skin:   multiple wounds, pressure ulcer stage II   Last BM: 7/13  Height:   Ht Readings from Last 1 Encounters:  02/17/15 6\' 2"  (1.88 m)    Weight:   Wt Readings from Last 1 Encounters:  02/24/15 166 lb 1.6 oz (75.342 kg)    Filed Weights   02/17/15 0224 02/19/15 1600 02/24/15 0605  Weight: 168 lb (76.204 kg) 168 lb 14 oz (76.6 kg) 166 lb 1.6 oz (75.342 kg)     Wt Readings from Last 10 Encounters:  02/24/15 166 lb 1.6 oz (75.342 kg)  02/03/15 177 lb 1.6 oz (80.332 kg)    BMI:  Body mass index is 21.32 kg/(m^2).  Estimated Nutritional Needs:   Kcal:  4098-11912041-2412 kcals (BEE 1546, 1.2 AF, 1.1-1.3 IF) using current wt of 76 kg  Protein:  91-114 g (1.2-1.5 g/kg)   Fluid:  2280-2660 mL (30-35 ml/kg)   HIGH Care Level  Romelle Starcherate Acsa Estey MS, RD, LDN [phone] Pager

## 2015-02-24 NOTE — Care Management (Signed)
Patient is to have angiogram with possible intervention on right lower extremity.  Patient's sepsis sx improving but there is concern of cardiac stability due to Vtach.  Procedure is planned for Monday if patient remains stable.  Patient is not maintaining adequate oral nutrition which will need to be addressed with family to decide if need to proceed with alternate method of nutrition because patient's wounds will not heal without adequate nutrition.   This patient is a full code at present time.  Need to address goals of treatment

## 2015-02-24 NOTE — Care Management Important Message (Signed)
Important Message  Patient Details  Name: Ronald Good MRN: [id number] Date of Birth: [birth year]   Medicare Important Message Given:  Yes-third notification given    Eber HongGreene, Klay Sobotka R, RN 02/24/2015, 6:23 PM   Left IM in patient's room and explained it to the patient's daughter Raynelle FanningJulie who verbalized understanding of contents and that CM is available for further questions

## 2015-02-25 LAB — POTASSIUM: Potassium: 3.9 mmol/L (ref 3.5–5.1)

## 2015-02-25 LAB — MAGNESIUM: Magnesium: 1.9 mg/dL (ref 1.7–2.4)

## 2015-02-25 MED ORDER — DRONABINOL 2.5 MG PO CAPS
2.5000 mg | ORAL_CAPSULE | Freq: Two times a day (BID) | ORAL | Status: DC
  Administered 2015-02-25 – 2015-03-03 (×9): 2.5 mg via ORAL
  Filled 2015-02-25 (×10): qty 1

## 2015-02-25 MED ORDER — MAGNESIUM OXIDE 400 (241.3 MG) MG PO TABS
400.0000 mg | ORAL_TABLET | Freq: Two times a day (BID) | ORAL | Status: DC
  Administered 2015-02-25 – 2015-03-04 (×12): 400 mg via ORAL
  Filled 2015-02-25 (×13): qty 1

## 2015-02-25 MED ORDER — POTASSIUM CHLORIDE CRYS ER 20 MEQ PO TBCR
20.0000 meq | EXTENDED_RELEASE_TABLET | Freq: Once | ORAL | Status: AC
  Administered 2015-02-25: 20 meq via ORAL
  Filled 2015-02-25: qty 1

## 2015-02-25 NOTE — Progress Notes (Signed)
Brief Nutrition Note:   [facility]; agree with initiation of calorie count as observed and reported po intake has been very poor; limited documentation of po intake as pt on Isolation. Noted that recorded po intake yesterday was 0% at breakfast, 20% at lunch, 10% at dinner. Noted pt started on Marinol, continues on Dysphagia I, Honey Thick Diet. Magic Cup and Honey Thick Mighty Shakes continue for additional nutrition and pt taking these at times per family report. Pt meeting clinical characteristics for severe malnutrition on admission as previously documented. Calorie count orders placed; will continue through the weekend. Discussed with Mya RN.

## 2015-02-25 NOTE — Progress Notes (Signed)
Speech Language Pathology Treatment: Dysphagia  Patient Details Name: Ronald Good MRN: [id number] DOB: Apr 26, 1934 Today's Date: 02/25/2015 Time: [id number] SLP Time Calculation (min) (ACUTE ONLY): 40 min  Assessment / Plan / Recommendation Clinical Impression  Pt appear to be tolerating his current Dysphagia diet w/ Honey consistency liquids but requires strict aspiration precautions and feeding assistance. Pt is at increased risk for aspiration even of pharyngeal residue sec. To his oropharyngeal phase dysphagia as per MBSS performed this week. Pt has a calorie count in the room for monitoring of amount of oral intake. Rec. Continue w/ strict aspiration precautions and Dysphagia diet. ST will f/u w/ toleration of diet and education next week. NSG consulted re: pt's status; updated.    HPI Other Pertinent Information: pt has been tolerating his current Dys. I w/ Honey consistency liquids per NSG report. Pt has only been taking bites/sips per report; a calorie count has been posted per Dietician. Pt continues to present w/ some confusion and appears fatigued and weak.    Pertinent Vitals Pain Assessment: No/denies pain  SLP Plan  Continue with current plan of care    Recommendations Diet recommendations: Dysphagia 1 (puree);Honey-thick liquid Liquids provided via: Teaspoon;Cup Medication Administration: Crushed with puree Supervision: Staff to assist with self feeding;Full supervision/cueing for compensatory strategies Compensations: Small sips/bites;Check for pocketing;Multiple dry swallows after each bite/sip;Follow solids with liquid Postural Changes and/or Swallow Maneuvers: Seated upright 90 degrees              General recommendations:  (Dietician consult) Oral Care Recommendations: Oral care BID;Oral care before and after PO;Staff/trained caregiver to provide oral care Follow up Recommendations: Skilled Nursing facility Plan: Continue with current plan of care    GO      Arbour Fuller HospitalWatson,Katherine 02/25/2015, 2:43 PM

## 2015-02-25 NOTE — Plan of Care (Signed)
Problem: Phase I Progression Outcomes Goal: Dyspnea controlled at rest Outcome: Progressing Pt remains Afebrile, BP 90-100s/50s, HR A-fib 80-90s, Pt on 3L [place] SpO2> 92%. Pt continues to receive Antibiotics.

## 2015-02-25 NOTE — Progress Notes (Signed)
[facility] [place] at [facility]   PATIENT NAME: Ronald CrookSamuel Good    MR#:  [id number]  DATE OF BIRTH:  01/19/1934  SUBJECTIVE:  CHIEF COMPLAINT:   Chief Complaint  Patient presents with  . Altered Mental Status   Feels good. Healing wounds and lower extremities, overall less pain.  Status post angiogram 11th of July 2016 revealing peripheral vascular disease in left lower extremity, status post PTA.  Modified Barium swallow study revealed significant dysphagia. Now on dysphagia diet. Dr. Wyn Quakerew recommends right lower extremity angiogram on Monday. , July 18 Nonsustained V. tach , SVT intermittently , seen by Dr. Lady GaryFath,  recommended to continue amiodarone. Mental status has improved   REVIEW OF SYSTEMS:  Complains of some lower abdominal discomfort pains as well as some lower extremity pains that seem to be subsiding  DRUG ALLERGIES:   Allergies  Allergen Reactions  . Oxycodone Other (See Comments)    "makes me loopy"  . Neomycin-Bacitracin Zn-Polymyx Rash    VITALS:  Blood pressure 111/80, pulse 88, temperature 98.4 F (36.9 C), temperature source Oral, resp. rate 20, height 6\' 2"  (1.88 m), weight 75.342 kg (166 lb 1.6 oz), SpO2 100 %.  PHYSICAL EXAMINATION:  GENERAL:  81 y.o.-year-old patient lying in the bed, more  responsive today is able to converse , following commands . Oral mucosa is relatively moist EYES: Pupils equal, round, reactive to light and accommodation. No scleral icterus. Extraocular muscles intact.  HEENT: Head atraumatic, normocephalic. Oropharynx and nasopharynx clear. More moist . Oral mucosa. NECK:  Supple, no jugular venous distention. No thyroid enlargement, no tenderness.  LUNGS: Normal breath sounds bilaterally, no wheezing, rales,rhonchi or crepitation,better inspiratory  effort. No use of accessory muscles of respiration.  CARDIOVASCULAR: S1, S2 normal. No murmurs, rubs, or gallops.  ABDOMEN: Soft, tender in lower abdomen, mostly  suprapubically on the right side, nondistended. Bowel sounds present. No organomegaly or mass. Significant discomfort on palpation in suprapubic area but no rebound or guarding was noted  EXTREMITIES: trace pedal edema, open wounds were noted in the anterior shins in lower part, Extensive ulcerations in the anterior aspect of the right lower extremity. No significant drainage dressing has been changed. No infection signs were noted.   Severe pain on manipulation of the patient's feet. Good peripheral pulses on the left. Diminished on the right NEUROLOGIC: Cranial nerves II through XII are intact. Muscle strength 3/5 in all extremities. Sensation intact. Gait not checked.  PSYCHIATRIC: The patient is alert, .  SKIN: Sacral DU stage 2. Leg wound in dressing. Poor turgor.   LABORATORY PANEL:   CBC  Recent Labs Lab 02/24/15 0214  WBC 9.3  HGB 12.1*  HCT 36.5*  PLT 190   ------------------------------------------------------------------------------------------------------------------  Chemistries   Recent Labs Lab 02/23/15 1619  02/25/15 0410  NA 143  --   --   K 3.7  < > 3.9  CL 115*  --   --   CO2 24  --   --   GLUCOSE 120*  --   --   BUN 13  --   --   CREATININE 0.79  --   --   CALCIUM 7.4*  --   --   MG  --   < > 1.9  AST 72*  --   --   ALT 34  --   --   ALKPHOS 224*  --   --   BILITOT 1.4*  --   --   < > =  values in this interval not displayed. ------------------------------------------------------------------------------------------------------------------  Cardiac Enzymes No results for input(s): TROPONINI in the last 168 hours. ------------------------------------------------------------------------------------------------------------------  RADIOLOGY:  Ct Abdomen Pelvis Wo Contrast  02/23/2015   CLINICAL DATA:  Suprapubic abdominal pain. History of chronic systolic congestive heart failure, atrial fibrillation, hypertension and BPH. Acute renal failure. Initial  encounter.  EXAM: CT ABDOMEN AND PELVIS WITHOUT CONTRAST  TECHNIQUE: Multidetector CT imaging of the abdomen and pelvis was performed following the standard protocol without IV contrast.  COMPARISON:  Abdominal pelvic CT 07/27/2014.  FINDINGS: Lower chest: New small dependent right-greater-than-left pleural effusions with patchy bibasilar pulmonary opacities, most likely atelectasis. The heart is enlarged. There is diffuse atherosclerosis of the aorta and coronary arteries.  Hepatobiliary: Stable mild contour irregularity of the liver which may reflect cirrhosis. No focal lesions identified on noncontrast imaging. Previous cholecystectomy. No evidence of biliary dilatation.  Pancreas: Atrophied without focal abnormality or surrounding inflammation.  Spleen: Normal in size without focal abnormality.  Adrenals/Urinary Tract: Both adrenal glands appear normal.The right kidney appears normal. 4.6 cm intermediate density lesion projecting from the lower pole of the left kidney has slightly enlarged, but is likely a cyst. There is no hydronephrosis or asymmetric perinephric soft tissue stranding. However, the previously demonstrated left renal calculi are now present within the distal left ureter. These are best seen on coronal image number 91, each measuring approximately 9 mm in diameter. Foley catheter is in place. The bladder is partially decompressed and contains a small amount of air.  Stomach/Bowel: No evidence of bowel wall thickening, distention or surrounding inflammatory change.Mild sigmoid colon diverticular changes.  Vascular/Lymphatic: Extensive atherosclerosis of the aorta, its branches and the iliac arteries status post aorto bi-iliac stenting. Bilateral iliac artery aneurysms are grossly stable. No evidence of retroperitoneal hematoma or lymphadenopathy.  Reproductive: Unremarkable.  Other: Stable postsurgical changes within the lower anterior abdominal wall with stable prominent fat in both inguinal  canals.  Musculoskeletal: No acute or significant osseous findings. Previous left hip pinning. There is diffuse thoracolumbar spondylosis.  IMPRESSION: 1. Interval passage of previously demonstrated left renal calculi into the distal left ureter. There is no significant hydronephrosis or perinephric soft tissue stranding, presumably secondary to underlying impaired renal function. 2. Bladder decompressed by Foley catheter. 3. Stable postsurgical changes within the low anterior abdominal wall with prominent fat in both inguinal canals. 4. Grossly stable diffuse atherosclerosis status post aortoiliac stenting. 5. New bilateral pleural effusions with associated bibasilar atelectasis.   Electronically Signed   By: Carey Bullocks M.D.   On: 02/23/2015 15:17    EKG:   Orders placed or performed during the hospital encounter of 02/17/15  . EKG 12-Lead  . EKG 12-Lead    ASSESSMENT AND PLAN:   HAP and Proteus mirabilis septicemia. Now off vancomycin and only on ciprofloxacin IV,  remains afebrile , blood pressure  stable on low rate IV fluids, white blood cell count is normal   Septic shock.  Tapered off levophed drip. Off  Lasix, toprol and metolazone for now. Continue IV fluids.  ARF. Status post NS iv bolus and IV fluid, continued administration. Kidney function has improved.  Follow up BMP. Hold lasix, toprol and metolazone. Kidney US: no obstruction.   Hypernatremia. Na 143 , was recently. Continue  saline and D5 water at lower rate,  following his oral intake, which remains poor. Initiate patient on Marinol.  Patient's urine output is  good , but urine color remains relatively dark. In addition, patient has dysphagia and  he will be on dysphagia diet with thickened liquids, unfortunately predisposing him to dehydration.   Hypokalemia. , Resolved  with KCl in IV fluids. F/u BMP intermittently. Dr. Lady Gary recommended to keep patient's potassium level above 4 , pharmacy is following patient's potassium  levels and adding potassium supplements as needed   Lactic acidosis. Resolved  Sacral decubitus ulcer stage 2 and infected leg wounds. Wound care appreciated. Appreciate vascular  surgery involvement , s/p Angiogram on left lower extremity 02/21/2015 planned right lower extremity July 18, per plan Following healing, seemed to be appropriate. Appreciate wound care input.   AFib with episodes of short to V. tach and SVT few days ago.Continue amiodarone and resume pradaxa after procedures are performed  Chronic systolic CHF. Stable with IV fluid administration, DC IV fluids when patient is able to take orally. Confusion , continue Haldol as needed every 6 hours and follow his condition, stable at present Suprapubic abdominal pain  CT scan of abdomen and pelvis showed no abscess or structural abnormalities, obstruction, etc. continue antibiotic therapy with ciprofloxacin at present Episode of V. tach, continue amiodarone per Dr. America Brown recommendations, keeping patient's electrolytes in normal levels   All the records are reviewed and case discussed with Care Management/Social Workerr. Management plans discussed with the patient, family and they are in agreement.  CODE STATUS: FULL CODE  Patient's clinical condition was discussed with patients family yesterday  TOTAL TIME OF TAKING CARE OF THIS PATIENT: 35 minutes.    Katharina Caper M.D on 02/25/2015 at 12:48 PM  Between 7am to 6pm - Pager - [phone]  After 6pm go to [email or web address] - password [email or web address] [facility]  [place] [facility]  Office  [phone]  CC: Primary care physician; Marisue Ivan, MD

## 2015-02-25 NOTE — Progress Notes (Signed)
Took over patient care this afternoon. [facility]. Patient sleeping, no signs of discomfort. Will continue to monitor.

## 2015-02-25 NOTE — Progress Notes (Signed)
MEDICATION RELATED CONSULT NOTE - FOLLOW UP   Pharmacy Consult for Electrolyte Management   Allergies  Allergen Reactions  . Oxycodone Other (See Comments)    "makes me loopy"  . Neomycin-Bacitracin Zn-Polymyx Rash    Patient Measurements: Height: 6\' 2"  (188 cm) Weight: 166 lb 1.6 oz (75.342 kg) IBW/kg (Calculated) : 82.2 Adjusted Body Weight: n/a  Vital Signs: Temp: 98.4 F (36.9 C) (07/15 1109) Temp Source: Oral (07/15 1109) BP: 111/80 mmHg (07/15 1109) Pulse Rate: 88 (07/15 1109) Intake/Output from previous day: 07/14 0701 - 07/15 0700 In: 1065.8 [I.V.:665.8; IV Piggyback:400] Out: 1225 [Urine:1225] Intake/Output from this shift:    Labs:  Recent Labs  02/23/15 0515 02/23/15 1619 02/24/15 0214 02/25/15 0410  WBC  --   --  9.3  --   HGB  --   --  12.1*  --   HCT  --   --  36.5*  --   PLT  --   --  190  --   CREATININE 0.70 0.79  --   --   MG 1.9  --  1.7 1.9  ALBUMIN  --  1.5*  --   --   PROT  --  5.2*  --   --   AST  --  72*  --   --   ALT  --  34  --   --   ALKPHOS  --  224*  --   --   BILITOT  --  1.4*  --   --    Estimated Creatinine Clearance: 81.1 mL/min (by C-G formula based on Cr of 0.79).   BMP Latest Ref Rng 02/25/2015 02/24/2015 02/23/2015  Glucose 65 - 99 mg/dL - - 161(W120(H)  BUN 6 - 20 mg/dL - - 13  Creatinine 9.600.61 - 1.24 mg/dL - - 4.540.79  Sodium 098135 - 145 mmol/L - - 143  Potassium 3.5 - 5.1 mmol/L 3.9 3.8 3.7  Chloride 101 - 111 mmol/L - - 115(H)  CO2 22 - 32 mmol/L - - 24  Calcium 8.9 - 10.3 mg/dL - - 7.4(L)      Medications:  Scheduled:  . amiodarone  200 mg Oral Daily  . ciprofloxacin  400 mg Intravenous Q12H  . collagenase   Topical Daily  . dronabinol  2.5 mg Oral BID AC  . heparin  5,000 Units Subcutaneous 3 times per day  . magnesium oxide  400 mg Oral BID  . pantoprazole  40 mg Oral QAC breakfast  . potassium chloride  20 mEq Oral Once  . tamsulosin  0.4 mg Oral Daily    Assessment: Potassium 3.9,   Mag 1.9. Per  cardiology recommendations: keep Mag>2.0 and Potassium > 4.0. Magnesium 400 mg po daily added by cardiology on 7/13. Patient also on D51/2NS with 20 meq Potassium Chloride at 50 ml/hr. Patient on Dysphagia 1 diet.  Goal of Therapy:  Normalization of electrolytes  Plan:  Will give Potassium Chloride 20 meq po x 1.  Will increase PO mag-ox to bid since Magnesium still low. Will obtain follow-up electrolytes with am labs.   Pharmacy will continue to monitor and adjust per consult.    Clovia CuffLisa Daion Ginsberg, PharmD, BCPS 02/25/2015 12:54 PM

## 2015-02-25 NOTE — Care Management (Signed)
Patient took 75% of his breakfast (had to be fed and encouraged) but little lunch.  Discussed nutritional status during progression .  Primary nurse will initiate referral for calorie count.  Wife says that family has talked about the possible need for feeding tube if patient is to sustain a nutritional status that will promote healing of his wounds but no decision has been made.

## 2015-02-26 LAB — BASIC METABOLIC PANEL
ANION GAP: 3 — AB (ref 5–15)
BUN: 10 mg/dL (ref 6–20)
CO2: 27 mmol/L (ref 22–32)
CREATININE: 0.65 mg/dL (ref 0.61–1.24)
Calcium: 7.5 mg/dL — ABNORMAL LOW (ref 8.9–10.3)
Chloride: 111 mmol/L (ref 101–111)
GFR calc non Af Amer: >60 mL/min (ref >60)
GLUCOSE: 90 mg/dL (ref 65–99)
Potassium: 3.8 mmol/L (ref 3.5–5.1)
SODIUM: 141 mmol/L (ref 135–145)

## 2015-02-26 MED ORDER — POTASSIUM CHLORIDE 20 MEQ PO PACK
40.0000 meq | PACK | Freq: Two times a day (BID) | ORAL | Status: AC
  Administered 2015-02-26 (×2): 40 meq via ORAL
  Filled 2015-02-26 (×2): qty 2

## 2015-02-26 MED ORDER — CHLORHEXIDINE GLUCONATE 4 % EX LIQD
60.0000 mL | Freq: Once | CUTANEOUS | Status: AC
Start: 2015-02-27 — End: 2015-02-27
  Administered 2015-02-27: 4 via TOPICAL

## 2015-02-26 MED ORDER — SODIUM CHLORIDE 0.9 % IV BOLUS (SEPSIS)
500.0000 mL | Freq: Once | INTRAVENOUS | Status: AC
  Administered 2015-02-26: 500 mL via INTRAVENOUS

## 2015-02-26 MED ORDER — SODIUM CHLORIDE 0.9 % IV SOLN: INTRAVENOUS | Status: DC

## 2015-02-26 MED ORDER — CHLORHEXIDINE GLUCONATE 4 % EX LIQD
60.0000 mL | Freq: Once | CUTANEOUS | Status: AC
  Administered 2015-02-26: 4 via TOPICAL

## 2015-02-26 NOTE — Progress Notes (Signed)
MEDICATION RELATED CONSULT NOTE - FOLLOW UP   Pharmacy Consult for Electrolyte Management   Allergies  Allergen Reactions  . Oxycodone Other (See Comments)    "makes me loopy"  . Neomycin-Bacitracin Zn-Polymyx Rash    Patient Measurements: Height: 6\' 2"  (188 cm) Weight: 166 lb 1.6 oz (75.342 kg) IBW/kg (Calculated) : 82.2 Adjusted Body Weight: n/a  Vital Signs: Temp: 97.6 F (36.4 C) (07/16 0528) Temp Source: Oral (07/16 0528) BP: 106/68 mmHg (07/16 0528) Pulse Rate: 93 (07/16 0528) Intake/Output from previous day: 07/15 0701 - 07/16 0700 In: 1335 [P.O.:110; I.V.:825; IV Piggyback:400] Out: 600 [Urine:600] Intake/Output from this shift:    Labs:  Recent Labs  02/23/15 1619 02/24/15 0214 02/25/15 0410 02/26/15 0545  WBC  --  9.3  --   --   HGB  --  12.1*  --   --   HCT  --  36.5*  --   --   PLT  --  190  --   --   CREATININE 0.79  --   --  0.65  MG  --  1.7 1.9  --   ALBUMIN 1.5*  --   --   --   PROT 5.2*  --   --   --   AST 72*  --   --   --   ALT 34  --   --   --   ALKPHOS 224*  --   --   --   BILITOT 1.4*  --   --   --    Estimated Creatinine Clearance: 81.1 mL/min (by C-G formula based on Cr of 0.65).   BMP Latest Ref Rng 02/26/2015 02/25/2015 02/24/2015  Glucose 65 - 99 mg/dL 90 - -  BUN 6 - 20 mg/dL 10 - -  Creatinine 1.610.61 - 1.24 mg/dL 0.960.65 - -  Sodium 045135 - 145 mmol/L 141 - -  Potassium 3.5 - 5.1 mmol/L 3.8 3.9 3.8  Chloride 101 - 111 mmol/L 111 - -  CO2 22 - 32 mmol/L 27 - -  Calcium 8.9 - 10.3 mg/dL 7.5(L) - -      Medications:  Scheduled:  . amiodarone  200 mg Oral Daily  . [START ON 02/27/2015] chlorhexidine  60 mL Topical Once  . ciprofloxacin  400 mg Intravenous Q12H  . collagenase   Topical Daily  . dronabinol  2.5 mg Oral BID AC  . heparin  5,000 Units Subcutaneous 3 times per day  . magnesium oxide  400 mg Oral BID  . pantoprazole  40 mg Oral QAC breakfast  . potassium chloride  40 mEq Oral BID  . tamsulosin  0.4 mg Oral Daily     Assessment: Potassium 3.9,   Mag 1.9. Per cardiology recommendations: keep Mag>2.0 and Potassium > 4.0. Magnesium 400 mg po daily added by cardiology on 7/13. Patient also on D51/2NS with 20 meq Potassium Chloride at 50 ml/hr. Patient on Dysphagia 1 diet.  Goal of Therapy:  Normalization of electrolytes  Plan:  Will give Potassium Chloride 40 meq po x 2.   Will continue Mag ox order. Will obtain follow-up electrolytes with am labs.   Pharmacy will continue to monitor and adjust per consult.    Demetrius Charityeldrin D. Babetta Paterson, PharmD   02/26/2015 8:12 AM

## 2015-02-26 NOTE — Progress Notes (Signed)
[facility] [place] at [facility]   PATIENT NAME: Ronald CrookSamuel Gunderman    MR#:  [id number]  DATE OF BIRTH:  1933/08/30  SUBJECTIVE:  CHIEF COMPLAINT:   Chief Complaint  Patient presents with  . Altered Mental Status  Feels weak. Status post angiogram 11th of July 2016 revealing peripheral vascular disease in left lower extremity, status post PTA.  Modified Barium swallow study revealed significant dysphagia. Now on dysphagia diet.  Dr. Wyn Quakerew recommends right lower extremity angiogram on Monday. , July 18 Nonsustained V. tach , SVT intermittently , seen by Dr. Lady GaryFath,  recommended to continue amiodarone. Mental status has improved  Family at bedside REVIEW OF SYSTEMS:  Review of Systems  Constitutional: Positive for malaise/fatigue. Negative for fever and chills.  HENT: Negative for sore throat.   Eyes: Negative for blurred vision, double vision and pain.  Respiratory: Negative for cough, hemoptysis, shortness of breath and wheezing.   Cardiovascular: Negative for chest pain, palpitations, orthopnea and leg swelling.  Gastrointestinal: Negative for heartburn, nausea, vomiting, abdominal pain, diarrhea and constipation.  Genitourinary: Negative for dysuria and hematuria.  Musculoskeletal: Positive for back pain. Negative for joint pain.  Skin: Negative for rash.       ulcers  Neurological: Positive for sensory change and weakness. Negative for speech change, focal weakness and headaches.  Endo/Heme/Allergies: Does not bruise/bleed easily.  Psychiatric/Behavioral: Negative for depression. The patient is not nervous/anxious.    DRUG ALLERGIES:   Allergies  Allergen Reactions  . Oxycodone Other (See Comments)    "makes me loopy"  . Neomycin-Bacitracin Zn-Polymyx Rash    VITALS:  Blood pressure 96/45, pulse 97, temperature 97.5 F (36.4 C), temperature source Oral, resp. rate 18, height 6\' 2"  (1.88 m), weight 75.342 kg (166 lb 1.6 oz), SpO2 100 %.  PHYSICAL  EXAMINATION:  GENERAL:  81 y.o.-year-old patient lying in the bed, more  responsive today is able to converse , following commands . Oral mucosa is relatively moist EYES: Pupils equal, round, reactive to light and accommodation. No scleral icterus. Extraocular muscles intact.  HEENT: Head atraumatic, normocephalic. Oropharynx and nasopharynx clear. More moist . Oral mucosa. NECK:  Supple, no jugular venous distention. No thyroid enlargement, no tenderness.  LUNGS: Normal breath sounds bilaterally, no wheezing, rales,rhonchi or crepitation,better inspiratory  effort. No use of accessory muscles of respiration.  CARDIOVASCULAR: S1, S2 normal. No murmurs, rubs, or gallops.  ABDOMEN: Soft, tender in lower abdomen, mostly suprapubically on the right side, nondistended. Bowel sounds present. No organomegaly or mass.  EXTREMITIES: trace pedal edema, open wounds were noted in the anterior shins in lower part, Extensive ulcerations in the anterior aspect of the right lower extremity. No significant drainage dressing has been changed. No infection signs were noted.   Severe pain on manipulation of the patient's feet. Good peripheral pulses on the left. Diminished on the right NEUROLOGIC: Cranial nerves II through XII are intact. Muscle strength 4/5 in all extremities. Sensation intact. Gait not checked.  PSYCHIATRIC: The patient is alert. Confused. SKIN: Sacral DU stage 2. Leg wound in dressing.   LABORATORY PANEL:   CBC  Recent Labs Lab 02/24/15 0214  WBC 9.3  HGB 12.1*  HCT 36.5*  PLT 190   ------------------------------------------------------------------------------------------------------------------  Chemistries   Recent Labs Lab 02/23/15 1619  02/25/15 0410 02/26/15 0545  NA 143  --   --  141  K 3.7  < > 3.9 3.8  CL 115*  --   --  111  CO2 24  --   --  27  GLUCOSE 120*  --   --  90  BUN 13  --   --  10  CREATININE 0.79  --   --  0.65  CALCIUM 7.4*  --   --  7.5*  MG  --   < >  1.9  --   AST 72*  --   --   --   ALT 34  --   --   --   ALKPHOS 224*  --   --   --   BILITOT 1.4*  --   --   --   < > = values in this interval not displayed. ------------------------------------------------------------------------------------------------------------------  Cardiac Enzymes No results for input(s): TROPONINI in the last 168 hours. ------------------------------------------------------------------------------------------------------------------  RADIOLOGY:  No results found.  EKG:   Orders placed or performed during the hospital encounter of 02/17/15  . EKG 12-Lead  . EKG 12-Lead    ASSESSMENT AND PLAN:   * Peripheral arterial disease   status post left lower extremity angiogram. Awaiting right lower extremity angiogram on Monday. Help with healing of his lower extremity infection and ulcers  * HCAP with acute respiratory failure Improving. Weanoxygen On antibiotics  * Pseudomonas and Proteus bacteremia   on IV ciprofloxacin. Source is lower extremity wounds  * Septic shock has resolved  *  Acute renal failure due to ATN   improved with IV fluids And back to normal  * Hypernatremia.  Due to dehydration . Resolved with IV fluids   * Sacral decubitus ulcer stage 2 and infected leg wounds. Wound care appreciated.  * AFib with episodes of short to V. tach and SVT few days ago.Continue amiodarone and resume pradaxa after procedures are performed  * Chronic systolic CHF. Stable  * Acute inpatient delirium due to acute illness Improved cntinue Haldol as needed every 6 hours and follow his condition, stable at present  * Suprapubic abdominal pain  CT scan of abdomen and pelvis showed nothing acute  All the records are reviewed and case discussed with Care Management/Social Workerr. Management plans discussed with the patient, family and they are in agreement.  CODE STATUS: FULL CODE  Patient's clinical condition was discussed with patients family  today in detail  TOTAL TIME OF TAKING CARE OF THIS PATIENT: 35 minutes.    Milagros Loll R M.D on 02/26/2015 at 11:40 AM  Between 7am to 6pm - Pager - [phone]  After 6pm go to [email or web address] - password [email or web address] [facility]  [place] [facility]  Office  [phone]  CC: Primary care physician; Marisue Ivan, MD

## 2015-02-26 NOTE — Progress Notes (Signed)
Pt more alert. Is being fed. Appetite fair to poor. Calorie count in progress. Dressings to legs and sacrum done. Iv fluids stopped. Foley patent. Suctioned with yannkauer for small amts white sputum.

## 2015-02-27 LAB — BASIC METABOLIC PANEL
ANION GAP: 4 — AB (ref 5–15)
BUN: 10 mg/dL (ref 6–20)
CALCIUM: 7.5 mg/dL — AB (ref 8.9–10.3)
CO2: 26 mmol/L (ref 22–32)
Chloride: 109 mmol/L (ref 101–111)
Creatinine, Ser: 0.57 mg/dL — ABNORMAL LOW (ref 0.61–1.24)
Glucose, Bld: 95 mg/dL (ref 65–99)
Potassium: 4.2 mmol/L (ref 3.5–5.1)
SODIUM: 139 mmol/L (ref 135–145)

## 2015-02-27 LAB — PLATELET COUNT: Platelets: 211 10*3/uL (ref 150–440)

## 2015-02-27 LAB — MAGNESIUM: MAGNESIUM: 1.7 mg/dL (ref 1.7–2.4)

## 2015-02-27 LAB — PHOSPHORUS: Phosphorus: 2.5 mg/dL (ref 2.5–4.6)

## 2015-02-27 NOTE — Progress Notes (Signed)
[facility] - [place] at [facility]   PATIENT NAME: Ronald Good    MR#:  [id number]  DATE OF BIRTH:  28-Sep-1933  SUBJECTIVE:  CHIEF COMPLAINT:   Chief Complaint  Patient presents with  . Altered Mental Status  Feels weak. Status post angiogram 11th of July 2016 revealing peripheral vascular disease in left lower extremity, status post PTA.  Modified Barium swallow study revealed significant dysphagia. Now on dysphagia diet.  Dr. Wyn Quaker recommended right lower extremity angiogram on Monday, July 18 Nonsustained V. tach , SVT intermittently , seen by Dr. Lady Gary,  recommended to continue amiodarone. Mental status has improved but still with confusion. Family at bedside REVIEW OF SYSTEMS:  Review of Systems  Constitutional: Positive for malaise/fatigue. Negative for fever and chills.  HENT: Negative for sore throat.   Eyes: Negative for blurred vision, double vision and pain.  Respiratory: Negative for cough, hemoptysis, shortness of breath and wheezing.   Cardiovascular: Negative for chest pain, palpitations, orthopnea and leg swelling.  Gastrointestinal: Negative for heartburn, nausea, vomiting, abdominal pain, diarrhea and constipation.  Genitourinary: Negative for dysuria and hematuria.  Musculoskeletal: Positive for back pain. Negative for joint pain.  Skin: Negative for rash.       ulcers  Neurological: Positive for sensory change and weakness. Negative for speech change, focal weakness and headaches.  Endo/Heme/Allergies: Does not bruise/bleed easily.  Psychiatric/Behavioral: Negative for depression. The patient is not nervous/anxious.    DRUG ALLERGIES:   Allergies  Allergen Reactions  . Oxycodone Other (See Comments)    "makes me loopy"  . Neomycin-Bacitracin Zn-Polymyx Rash    VITALS:  Blood pressure 103/67, pulse 93, temperature 97.8 F (36.6 C), temperature source Oral, resp. rate 16, height  (1.88 m), weight 79.833 kg (176 lb), SpO2 100  %.  PHYSICAL EXAMINATION:  GENERAL:  81 y.o.-year-old patient lying in the bed, more  responsive today is able to converse , following commands . Oral mucosa is relatively moist EYES: Pupils equal, round, reactive to light and accommodation. No scleral icterus. Extraocular muscles intact.  HEENT: Head atraumatic, normocephalic. Oropharynx and nasopharynx clear. More moist . Oral mucosa. NECK:  Supple, no jugular venous distention. No thyroid enlargement, no tenderness.  LUNGS: Normal breath sounds bilaterally, no wheezing, rales,rhonchi or crepitation,better inspiratory  effort. No use of accessory muscles of respiration.  CARDIOVASCULAR: S1, S2 normal. No murmurs, rubs, or gallops.  ABDOMEN: Soft, tender in lower abdomen, mostly suprapubically on the right side, nondistended. Bowel sounds present. No organomegaly or mass.  EXTREMITIES: trace pedal edema, open wounds were noted in the anterior shins in lower part, Extensive ulcerations in the anterior aspect of the right lower extremity. No significant drainage dressing has been changed. No infection signs were noted.   Severe pain on manipulation of the patient's feet. Good peripheral pulses on the left. Diminished on the right NEUROLOGIC: Cranial nerves II through XII are intact. Muscle strength 4/5 in all extremities. Sensation intact. Gait not checked.  PSYCHIATRIC: The patient is alert. Confused. SKIN: Sacral DU stage 2. Leg wound in dressing.   LABORATORY PANEL:   CBC  Recent Labs Lab 02/24/15 0214 02/27/15 0530  WBC 9.3  --   HGB 12.1*  --   HCT 36.5*  --   PLT 190 211   ------------------------------------------------------------------------------------------------------------------  Chemistries   Recent Labs Lab 02/23/15 1619  02/27/15 0530  NA 143  < > 139  K 3.7  < > 4.2  CL 115*  < > 109  CO2 24  < > 26  GLUCOSE 120*  < > 95  BUN 13  < > 10  CREATININE 0.79  < > 0.57*  CALCIUM 7.4*  < > 7.5*  MG  --   < > 1.7   AST 72*  --   --   ALT 34  --   --   ALKPHOS 224*  --   --   BILITOT 1.4*  --   --   < > = values in this interval not displayed. ------------------------------------------------------------------------------------------------------------------  Cardiac Enzymes No results for input(s): TROPONINI in the last 168 hours. ------------------------------------------------------------------------------------------------------------------  RADIOLOGY:  No results found.  EKG:   Orders placed or performed during the hospital encounter of 02/17/15  . EKG 12-Lead  . EKG 12-Lead    ASSESSMENT AND PLAN:   * Peripheral arterial disease  s/p left lower extremity angiogram. Awaiting right lower extremity angiogram on Monday. Help with healing of his lower extremity infection and ulcers  * HCAP with acute respiratory failure Improving. Wean oxygen On antibiotics  * Pseudomonas and Proteus bacteremia   on IV ciprofloxacin. Source is lower extremity wounds Switch to PO for total 2 weeks at discharge.  * Septic shock has resolved  *  Acute renal failure due to ATN   Resolved with IV fluids  * Hypernatremia.  Due to dehydration . Resolved with IV fluids   * Sacral decubitus ulcer stage 2 and infected leg wounds. Wound care appreciated.  * AFib with episodes of short to V. tach and SVT few days ago Continue amiodarone and resume pradaxa after procedures are performed  * Chronic systolic CHF. Stable  * Acute inpatient delirium due to acute illness Improved cntinue Haldol as needed every 6 hours and follow his condition, stable at present  * Suprapubic abdominal pain  CT scan of abdomen and pelvis showed nothing acute  All the records are reviewed and case discussed with Care Management/Social Workerr. Management plans discussed with the patient, family and they are in agreement.  CODE STATUS: FULL CODE  Patient's clinical condition was discussed with patients family today in  detail  TOTAL TIME OF TAKING CARE OF THIS PATIENT: 35 minutes.    Milagros LollSudini, Kassidi Elza R M.D on 02/27/2015 at 10:57 AM  Between 7am to 6pm - Pager - [phone]  After 6pm go to [email or web address] - password [email or web address] [facility]  [facility]  Office  [phone]  CC: Primary care physician; Marisue IvanLINTHAVONG, KANHKA, MD

## 2015-02-28 ENCOUNTER — Encounter
Admission: EM | Disposition: A | Discharge status: Hospice | Payer: Self-pay | Source: Home / Self Care | Attending: Internal Medicine

## 2015-02-28 HISTORY — PX: PERIPHERAL VASCULAR CATHETERIZATION: SHX172C

## 2015-02-28 SURGERY — LOWER EXTREMITY ANGIOGRAPHY
Anesthesia: Moderate Sedation | Laterality: Right

## 2015-02-28 MED ORDER — HEPARIN (PORCINE) IN NACL 2-0.9 UNIT/ML-% IJ SOLN
INTRAMUSCULAR | Status: AC
  Filled 2015-02-28: qty 1000

## 2015-02-28 MED ORDER — FENTANYL CITRATE (PF) 100 MCG/2ML IJ SOLN
INTRAMUSCULAR | Status: DC | PRN
  Administered 2015-02-28 (×2): 50 ug via INTRAVENOUS
  Administered 2015-02-28 (×2): 25 ug via INTRAVENOUS
  Administered 2015-02-28: 50 ug via INTRAVENOUS

## 2015-02-28 MED ORDER — CEFAZOLIN SODIUM 1-5 GM-% IV SOLN
INTRAVENOUS | Status: AC
  Filled 2015-02-28: qty 50

## 2015-02-28 MED ORDER — IOHEXOL 300 MG/ML  SOLN
INTRAMUSCULAR | Status: DC | PRN
  Administered 2015-02-28: 115 mL via INTRAVENOUS

## 2015-02-28 MED ORDER — FENTANYL CITRATE (PF) 100 MCG/2ML IJ SOLN
INTRAMUSCULAR | Status: AC
  Filled 2015-02-28: qty 2

## 2015-02-28 MED ORDER — LIDOCAINE-EPINEPHRINE (PF) 1 %-1:200000 IJ SOLN
INTRAMUSCULAR | Status: AC
  Filled 2015-02-28: qty 30

## 2015-02-28 MED ORDER — MIDAZOLAM HCL 2 MG/2ML IJ SOLN
INTRAMUSCULAR | Status: AC
  Filled 2015-02-28: qty 4

## 2015-02-28 MED ORDER — HEPARIN SODIUM (PORCINE) 1000 UNIT/ML IJ SOLN
INTRAMUSCULAR | Status: DC | PRN
  Administered 2015-02-28: 2000 [IU] via INTRAVENOUS
  Administered 2015-02-28: 4000 [IU] via INTRAVENOUS

## 2015-02-28 MED ORDER — SODIUM CHLORIDE 0.9 % IV SOLN
INTRAVENOUS | Status: DC
  Administered 2015-02-28 (×2): via INTRAVENOUS

## 2015-02-28 MED ORDER — MIDAZOLAM HCL 2 MG/2ML IJ SOLN
INTRAMUSCULAR | Status: DC | PRN
  Administered 2015-02-28 (×2): 1 mg via INTRAVENOUS
  Administered 2015-02-28 (×2): 2 mg via INTRAVENOUS

## 2015-02-28 MED ORDER — POLYETHYLENE GLYCOL 3350 17 G PO PACK
17.0000 g | PACK | Freq: Every day | ORAL | Status: DC
  Administered 2015-03-01 – 2015-03-02 (×2): 17 g via ORAL
  Filled 2015-02-28 (×2): qty 1

## 2015-02-28 MED ORDER — HEPARIN SODIUM (PORCINE) 1000 UNIT/ML IJ SOLN
INTRAMUSCULAR | Status: AC
  Filled 2015-02-28: qty 1

## 2015-02-28 MED ORDER — MIDAZOLAM HCL 2 MG/2ML IJ SOLN
INTRAMUSCULAR | Status: AC
  Filled 2015-02-28: qty 2

## 2015-02-28 SURGICAL SUPPLY — 23 items
BALLN ARMADA 35LL4X150X135 (BALLOONS) ×3 IMPLANT
BALLN DORADO 6X80X80 (BALLOONS) ×3
BALLN ULTRVRSE 5X300X150 (BALLOONS) ×3
BALLN ULTRVRSE 6X220X130 (BALLOONS) ×3
BALLOON DORADO 6X80X80 (BALLOONS) ×1 IMPLANT
BALLOON ULTRVRSE 5X300X150 (BALLOONS) IMPLANT
BALLOON ULTRVRSE 6X220X130 (BALLOONS) ×1 IMPLANT
CATH CXI 4F 90 DAV (MICROCATHETER) ×3 IMPLANT
CATH CXI SUPP ANG 4FR 135 (MICROCATHETER) ×1 IMPLANT
CATH CXI SUPP ANG 4FR 135CM (MICROCATHETER) ×3
CATH CXI SUPP ST 4FR 135CM (MICROCATHETER) ×3 IMPLANT
CATH KMP 5.0X100 (CATHETERS) ×3 IMPLANT
CATH ROYAL FLUSH PIG 5F 70CM (CATHETERS) ×3 IMPLANT
DEVICE PRESTO INFLATION (MISCELLANEOUS) ×3 IMPLANT
GLIDEWIRE ADV .035X260CM (WIRE) ×3 IMPLANT
GUIDEWIRE PFTE-COATED .018X300 (WIRE) ×3 IMPLANT
PACK ANGIOGRAPHY (CUSTOM PROCEDURE TRAY) ×3 IMPLANT
SHEATH ANL2 6FRX45 HC (SHEATH) ×3 IMPLANT
SHEATH BRITE TIP 5FRX11 (SHEATH) ×3 IMPLANT
STENT VIABAHN 6X250X120 (Permanent Stent) ×2 IMPLANT
SYR MEDRAD MARK V 150ML (SYRINGE) ×3 IMPLANT
TUBING CONTRAST HIGH PRESS 72 (TUBING) ×3 IMPLANT
WIRE J 3MM .035X145CM (WIRE) ×3 IMPLANT

## 2015-02-28 NOTE — Progress Notes (Signed)
[facility] -  at [facility]   PATIENT NAME: Ronald Good    MR#:  [id number]  DATE OF BIRTH:  1933-11-15  SUBJECTIVE:  CHIEF COMPLAINT:   Chief Complaint  Patient presents with  . Altered Mental Status  Status post angiogram 11th of July 2016 revealing peripheral vascular disease in left lower extremity, status post PTA.  Modified Barium swallow study revealed significant dysphagia. Now on dysphagia diet.  Dr. Wyn Quaker recommended right lower extremity angiogram on Monday, July 18 Nonsustained V. tach , SVT intermittently , seen by Dr. Lady Gary,  recommended to continue amiodarone. Mental status has improved but still with confusion.  Awaiting angiogram later today REVIEW OF SYSTEMS:  Review of Systems  Constitutional: Positive for malaise/fatigue. Negative for fever and chills.  HENT: Negative for sore throat.   Eyes: Negative for blurred vision, double vision and pain.  Respiratory: Negative for cough, hemoptysis, shortness of breath and wheezing.   Cardiovascular: Negative for chest pain, palpitations, orthopnea and leg swelling.  Gastrointestinal: Negative for heartburn, nausea, vomiting, abdominal pain, diarrhea and constipation.  Genitourinary: Negative for dysuria and hematuria.  Musculoskeletal: Positive for back pain. Negative for joint pain.  Skin: Negative for rash.       ulcers  Neurological: Positive for sensory change and weakness. Negative for speech change, focal weakness and headaches.  Endo/Heme/Allergies: Does not bruise/bleed easily.  Psychiatric/Behavioral: Negative for depression. The patient is not nervous/anxious.    DRUG ALLERGIES:   Allergies  Allergen Reactions  . Oxycodone Other (See Comments)    "makes me loopy"  . Neomycin-Bacitracin Zn-Polymyx Rash    VITALS:  Blood pressure 108/60, pulse 92, temperature 98.7 F (37.1 C), temperature source Oral, resp. rate 18, height  (1.88 m), weight 74.208 kg (163 lb 9.6 oz),  SpO2 100 %.  PHYSICAL EXAMINATION:  GENERAL:  81 y.o.-year-old patient lying in the bed, more  responsive today is able to converse , following commands . Oral mucosa is relatively moist EYES: Pupils equal, round, reactive to light and accommodation. No scleral icterus. Extraocular muscles intact.  HEENT: Head atraumatic, normocephalic. Oropharynx and nasopharynx clear. More moist . Oral mucosa. NECK:  Supple, no jugular venous distention. No thyroid enlargement, no tenderness.  LUNGS: Normal breath sounds bilaterally, no wheezing, rales,rhonchi or crepitation,better inspiratory  effort. No use of accessory muscles of respiration.  CARDIOVASCULAR: S1, S2 normal. No murmurs, rubs, or gallops.  ABDOMEN: Soft, tender in lower abdomen, mostly suprapubically on the right side, nondistended. Bowel sounds present. No organomegaly or mass.  EXTREMITIES: trace pedal edema, open wounds were noted in the anterior shins in lower part, Extensive ulcerations in the anterior aspect of the right lower extremity. No significant drainage dressing has been changed. No infection signs were noted.   Severe pain on manipulation of the patient's feet. Good peripheral pulses on the left. Diminished on the right NEUROLOGIC: Cranial nerves II through XII are intact. Muscle strength 4/5 in all extremities. Sensation intact. Gait not checked.  PSYCHIATRIC: The patient is alert. Confused. SKIN: Sacral DU stage 2. Leg wound in dressing.   LABORATORY PANEL:   CBC  Recent Labs Lab 02/24/15 0214 02/27/15 0530  WBC 9.3  --   HGB 12.1*  --   HCT 36.5*  --   PLT 190 211   ------------------------------------------------------------------------------------------------------------------  Chemistries   Recent Labs Lab 02/23/15 1619  02/27/15 0530  NA 143  < > 139  K 3.7  < > 4.2  CL 115*  < >  109  CO2 24  < > 26  GLUCOSE 120*  < > 95  BUN 13  < > 10  CREATININE 0.79  < > 0.57*  CALCIUM 7.4*  < > 7.5*  MG  --    < > 1.7  AST 72*  --   --   ALT 34  --   --   ALKPHOS 224*  --   --   BILITOT 1.4*  --   --   < > = values in this interval not displayed. ------------------------------------------------------------------------------------------------------------------  Cardiac Enzymes No results for input(s): TROPONINI in the last 168 hours. ------------------------------------------------------------------------------------------------------------------  RADIOLOGY:  No results found.  EKG:   Orders placed or performed during the hospital encounter of 02/17/15  . EKG 12-Lead  . EKG 12-Lead    ASSESSMENT AND PLAN:   * Peripheral arterial disease  s/p left lower extremity angiogram. Awaiting right lower extremity angiogram on Monday. Help with healing of his lower extremity infection and ulcers  * HCAP with acute respiratory failure Improving. Wean oxygen On antibiotics  * Pseudomonas and Proteus bacteremia   on IV ciprofloxacin. Source is lower extremity wounds Switch to PO for total 2 weeks at discharge.  * Septic shock has resolved  *  Acute renal failure due to ATN   Resolved with IV fluids  * Hypernatremia.  Due to dehydration . Resolved with IV fluids   * Sacral decubitus ulcer stage 2 and infected leg wounds. Wound care appreciated.  * AFib with episodes of short to V. tach and SVT few days ago Continue amiodarone and resume pradaxa after procedures are performed  * Chronic systolic CHF. Stable  * Acute inpatient delirium due to acute illness Improved cntinue Haldol as needed every 6 hours and follow his condition, stable at present  * Suprapubic abdominal pain  CT scan of abdomen and pelvis showed nothing acute  * Severe protein calorie malnutrition Unable to eat due to dementia and also chronic dysphagia. Patient seems appropriate for hospice. Will discuss with family regarding PEG vs Hospice.  All the records are reviewed and case discussed with Care  Management/Social Workerr. Management plans discussed with the patient, family and they are in agreement.  CODE STATUS: FULL CODE    TOTAL TIME OF TAKING CARE OF THIS PATIENT: 35 minutes.    Milagros LollSudini, Suzetta Timko R M.D on 02/28/2015 at 1:29 PM  Between 7am to 6pm - Pager - [phone]  After 6pm go to [email or web address] - password [email or web address] [facility]  [facility]  Office  [phone]  CC: Primary care physician; Marisue IvanLINTHAVONG, KANHKA, MD

## 2015-02-28 NOTE — Care Management (Signed)
Patient had long segment SFA occlusion with reconstitution   of the popliteal artery above the knee successfully treated with percutaneous therapy today by vascular.  Decision needs to be made as to whether to pursue with peg tube, feedings, aggressive treatment vs hospice care.  Family has not made decision but it is being discussed.

## 2015-02-28 NOTE — CV Procedure (Signed)
[facility] Percutaneous Study/Intervention Procedural Note   Date of Surgery: 02/17/2015 - 02/28/2015  Surgeon(s):Moneka Mcquinn   Assistants:none  Pre-operative Diagnosis: PAD with ulceration and gangrene BLE  Post-operative diagnosis: Same  Procedure(s) Performed: 1. Ultrasound guidance for vascular access left femoral artery 2. Catheter placement into right popliteal artery from left femoral approach 3. Selective right lower extremity angiogram 4. Percutaneous transluminal angioplasty of right SFA and popliteal artery with 5 and 6 mm balloon 5. Viabahn covered stent 6mm x 25 cm to the right SFA and popliteal for >50% residual stenosis after PTA  6. StarClose closure device left femoral artery  EBL: 25 cc  Indications: Patient is a 81 year old male with large nonhealing ulcerations of bilateral lower extremities and digital gangrene. He has a known history of peripheral vascular disease. He is status post left lower extremity revascularization last week. The patient is brought in for angiography for further evaluation and potential treatment. Risks and benefits are discussed and informed consent is obtained  Procedure: The patient was identified and appropriate procedural time out was performed. The patient was then placed supine on the table and prepped and draped in the usual sterile fashion. Ultrasound was used to evaluate the left common femoral artery. It was patent . A digital ultrasound image was acquired. A Seldinger needle was used to access the left common femoral artery under direct ultrasound guidance and a permanent image was performed. A 0.035 J wire was advanced without resistance and a 5Fr sheath was placed. No aortogram was performed as one was done last week. I then crossed the aortic bifurcation and advanced to the right femoral head and into the proximal right  superficial femoral artery. Selective right lower extremity angiogram was then performed. This demonstrated a long SFA occlusion with reconstitution in the above-knee popliteal artery. The patient was systemically heparinized and a 6 [place] sheath was then placed over the [employer] wire. I then used a Kumpe catheter and the advantage wire to cross the lesion.  Multiple wires and catheters were used and the dense calcific lesion was not easy to cross, but eventually we re-entered the popliteal and confirmed intraluminal flow.  I then exchanged for a 0.018 wire and used a 5mm diameter and then a 6 mm diameter balloon to perform angioplasty of the superficial femoral artery and above-knee popliteal artery. Despite angioplasty, there was multiple areas of greater than 50% stenosis and dissection residual after angioplasty. I therefore elected to treat this with a long Viabahn covered stent. A 6 mm diameter by 25 cm length Viabahn covered stent was placed, and then postdilated with a 6 mm balloon. There was one area with about a 20-25% residual narrowing in the mid stent, but was otherwise widely patent good runoff distally. He had two-vessel runoff in the tibial vessels. At this point, his flow was brisk and I felt there was no further percutaneous therapy of benefit. I elected to terminate the procedure. The sheath was removed and StarClose closure device was deployed in the left femoral artery with excellent hemostatic result. The patient was taken to the recovery room in stable condition having tolerated the procedure well.  Findings:   Right Lower Extremity: Long segment SFA occlusion with reconstitution of the popliteal artery above the knee successfully treated with percutaneous therapy   Disposition: Patient was taken to the recovery room in stable condition having tolerated the procedure well.  Complications: None  Ronald Good 02/28/2015 4:28 PM

## 2015-02-28 NOTE — Progress Notes (Signed)
Pt returned from vascular lab. He is sleepy. Left groin site clean.,solf and w/o bruising. ivfluids running. Foley patent.

## 2015-02-28 NOTE — Progress Notes (Signed)
Nutrition Follow-up  DOCUMENTATION CODES:   Severe malnutrition in context of acute illness/injury  INTERVENTION:   1) Coordination of Care: CALORIE COUNT Results for Friday, Saturday and Sunday. Incomplete documentation for Friday and Saturday but all 3 meals accounted for on Sunday.  Sunday: 1140 kcals, 45 g of protein (56% of kcals, 50% of protein) Saturday: no recorded intake for Dinner; for Breakfast and Lunch pt consumed 365 kcals, 6.5 g of protein. Pt did not eat any lunch Friday: no recorded intake for Dinner; pt ate 310 kcals, 10 g of protein at Lunch, unsure of Breakfast as calorie count not started until after Breakfast but per report pt ate some breakfast that AM **Pt does not appear to be meeting nutritional needs, although po intake improved on Sunday per documentation. Even though pt ate all 3 meals on Sunday, only meeting about 50% of calorie, protein needs on that day. Per documentation, <50% of calorie and protein needs on Friday and Saturday (although missing some documentation). Per observation, fluid intake is also very poor. Biggest concerns are pt's inability to meet necessary protein and hydration needs for wound healing; would likely benefit from PEG placement to maximize nutrition. Pt could still take po as tolerated even if PEG placed. Discussed with MD Sudini and Care Management Team. MD ok with discontinuing Calorie Count at this time 2) Nutrition related medication management: recommend addition of MVI for wounds as po intake inadequate; last documented BM 7/13, recommend bowel regimen 3) Medical Food Supplement Therapy: continue Magic Cup, Honey Thick MightyShakes   NUTRITION DIAGNOSIS:   Inadequate oral intake related to acute illness as evidenced by per patient/family report.   GOAL:   Patient will meet greater than or equal to 90% of their needs  MONITOR:   PO intake (Energy Intake, Anthropometrics, Digestive System, Electrolyte/Renal Profile)  REASON  FOR ASSESSMENT:   Malnutrition Screening Tool    ASSESSMENT:    Plan for right LE angiogram today  Diet Order:  Diet NPO time specified Except for: Sips with Meds for prodecure; previously on Dysphagia I, Honey thick Diet  Energy Intake:  Calorie count complete, results above  Skin:   bilateral LE ulcerations/necrotic areas, stage III on sacrum, stage II buttock/sacrum  Last BM:  7/13   Electrolyte and Renal Profile:  Recent Labs Lab 02/22/15 0559  02/23/15 1619 02/24/15 0214 02/25/15 0410 02/26/15 0545 02/27/15 0530  BUN 15  < > 13  --   --  10 10  CREATININE 0.67  < > 0.79  --   --  0.65 0.57*  NA 142  < > 143  --   --  141 139  K 3.3*  < > 3.7 3.8 3.9 3.8 4.2  MG 1.7  < >  --  1.7 1.9  --  1.7  PHOS 2.8  --   --   --   --   --  2.5  < > = values in this interval not displayed. Glucose Profile: No results for input(s): GLUCAP in the last 72 hours. Protein Profile:  Recent Labs Lab 02/23/15 1619  ALBUMIN 1.5*    Meds: D5-1/2NS with Kcl at 75 ml/hr, marinol  Height:   Ht Readings from Last 1 Encounters:  02/17/15  (1.88 m)    Weight:   Wt Readings from Last 1 Encounters:  02/28/15 163 lb 9.6 oz (74.208 kg)    Filed Weights   02/24/15 0605 02/27/15 0518 02/28/15 0500  Weight: 166 lb 1.6 oz (75.342  kg) 176 lb (79.833 kg) 163 lb 9.6 oz (74.208 kg)     Wt Readings from Last 10 Encounters:  02/28/15 163 lb 9.6 oz (74.208 kg)  02/03/15 177 lb 1.6 oz (80.332 kg)    BMI:  Body mass index is 21 kg/(m^2).  Estimated Nutritional Needs:   Kcal:  [id number] kcals (BEE 1546, 1.2 AF, 1.1-1.3 IF) using current wt of 76 kg  Protein:  91-114 g (1.2-1.5 g/kg)   Fluid:  2280-2660 mL (30-35 ml/kg)   Romelle Starcherate Verlon Pischke MS, RD, LDN [phone] Pager

## 2015-03-01 MED ORDER — CIPROFLOXACIN HCL 500 MG PO TABS
500.0000 mg | ORAL_TABLET | Freq: Two times a day (BID) | ORAL | Status: DC
  Administered 2015-03-01 – 2015-03-04 (×6): 500 mg via ORAL
  Filled 2015-03-01 (×6): qty 1

## 2015-03-01 NOTE — Consult Note (Signed)
GI Inpatient Consult Note  Reason for Consult: Dysphagia & aspiration pneumonia   Attending Requesting Consult: Sudini  History of Present Illness: Ronald Good is a 81 y.o. male with a history of CHF, Afib, and HTN with new dysphagia.  Patient was initially hospitalized on 02/17/15 with altered mental status, aspiration PNA, and severe bilateral lower extremity cellulitis.  He was treated with abx for both aspiration PNA and cellulitis, but progressed to sepsis.  He underwent LLE and RLE revascularization and continues to be followed by vascular surgery.  On 02/22/15 patient reported difficulty swallowing, choking, and regurgitating food.  A barium swallow was attempted but not completed due to penetration, modified barium swallow revealed oropharyngeal phase dysphagia and esophageal dysmotility.  He was placed on a dysphagia diet, but it appears GI was not consulted until today.  Per notes from [facility] nursing staff and dietician, patient has had a fair to poor appetite since admission.  Po and fluid intake has been poor and he has met clinical characteristics for severe malnutrition.  As of 7/18, patient's care team recommended a PEG tube be placed to help patient meet protein and calorie needs, as well as continuing aggressive treatment vs. Hospice care.  Patient's family is currently discussing these options.  Today, patient was asleep and difficult to arouse during multiple visits.  No family was present at the bedside.  Per nursing reports, patient continues to have confusion and weakness.  Dysphagia diet and nutrition shakes have helped provide nutrition, but he continues to lack an adequate appetite.  He has regurgitated food and endorsed feeling like she is choking at times per internal medicine notes.  He has no prior history of UGI issues.   Past Medical History:  Past Medical History  Diagnosis Date  . Chronic systolic congestive heart failure   . BPH (benign prostatic hyperplasia)    . Atrial fibrillation   . Essential hypertension   . Gastroesophageal reflux disease     Problem List: Patient Active Problem List   Diagnosis Date Noted  . Protein-calorie malnutrition, severe 02/19/2015  . Pressure ulcer 02/18/2015  . Pneumonia 02/17/2015  . ARF (acute renal failure) 02/17/2015  . Hypernatremia 02/17/2015  . Hypotension 02/17/2015  . Blood poisoning   . Essential (primary) hypertension 01/27/2015  . Cellulitis and abscess of lower extremity 01/27/2015  . Breathlessness on exertion 12/02/2014  . Acute on chronic systolic heart failure 47/04/6282  . Atrial fibrillation, chronic 12/07/2013  . Benign prostatic hyperplasia with urinary obstruction 03/10/2013  . Calculus of kidney 03/10/2013  . FOM (frequency of micturition) 03/10/2013  . Delayed onset of urination 03/10/2013    Past Surgical History: Past Surgical History  Procedure Laterality Date  . Replacement total knee bilateral Bilateral   . Peripheral vascular catheterization Left 02/21/2015    Procedure: Lower Extremity Angiography;  Surgeon: Algernon Huxley, MD;  Location: [place] [facility];  Service: Cardiovascular;  Laterality: Left;  . Peripheral vascular catheterization Left 02/21/2015    Procedure: Lower Extremity Intervention;  Surgeon: Algernon Huxley, MD;  Location: [place] [facility];  Service: Cardiovascular;  Laterality: Left;    Allergies: Allergies  Allergen Reactions  . Oxycodone Other (See Comments)    "makes me loopy"  . Neomycin-Bacitracin Zn-Polymyx Rash    Home Medications: Prescriptions prior to admission  Medication Sig Dispense Refill Last Dose  . amiodarone (PACERONE) 200 MG tablet Take 200 mg by mouth daily.   02/16/2015 at 0800  . dabigatran (PRADAXA) 150 MG  CAPS capsule Take 150 mg by mouth 2 (two) times daily.   02/16/2015 at 1600  . furosemide (LASIX) 20 MG tablet Take 20 mg by mouth 2 (two) times daily.   02/16/2015 at 1600  . metolazone (ZAROXOLYN) 5 MG tablet Take 5 mg by  mouth daily. Take 30 minutes before AM lasix   02/16/2015 at 0630  . metoprolol succinate (TOPROL-XL) 50 MG 24 hr tablet Take 25 mg by mouth daily.   02/16/2015 at 1700  . oxyCODONE-acetaminophen (PERCOCET/ROXICET) 5-325 MG per tablet Take 2 tablets by mouth every 4 (four) hours as needed for severe pain (For pain with dressing change). Give 2 tablets by mount 40 minutes before dressing change.   02/14/2015 at 0950  . pantoprazole (PROTONIX) 20 MG tablet Take 20 mg by mouth daily.   02/16/2015 at 0600  . polyethylene glycol (MIRALAX / GLYCOLAX) packet Take 17 g by mouth daily. 14 each 0 02/16/2015 at 1700  . potassium chloride SA (K-DUR,KLOR-CON) 20 MEQ tablet Take 20 mEq by mouth daily.   02/16/2015 at 0900  . tamsulosin (FLOMAX) 0.4 MG CAPS capsule Take 0.4 mg by mouth daily.   02/16/2015 at 0800  . traMADol (ULTRAM) 50 MG tablet Take 2 tablets (100 mg total) by mouth every 6 (six) hours as needed for moderate pain. 30 tablet 1 02/16/2015 at 1641  . albuterol (VENTOLIN HFA) 108 (90 BASE) MCG/ACT inhaler Inhale 108 mcg into the lungs every 4 (four) hours as needed. 2 inhalations into lungs every four hours as needed for wheezing   PRN at PRN  . traMADol (ULTRAM) 50 MG tablet Take 1 tablet (50 mg total) by mouth every 6 (six) hours as needed for moderate pain or severe pain. As needed for pain 30 tablet 0 02/15/2015 at [place] medication reconciliation was completed with the patient.   Scheduled Inpatient Medications:   . amiodarone  200 mg Oral Daily  . ciprofloxacin  400 mg Intravenous Q12H  . collagenase   Topical Daily  . dronabinol  2.5 mg Oral BID AC  . heparin  5,000 Units Subcutaneous 3 times per day  . magnesium oxide  400 mg Oral BID  . pantoprazole  40 mg Oral QAC breakfast  . polyethylene glycol  17 g Oral Daily  . tamsulosin  0.4 mg Oral Daily    Continuous Inpatient Infusions:   . dextrose 5 % and 0.45 % NaCl with KCl 20 mEq/L 50 mL/hr at 03/01/15 1026    PRN Inpatient Medications:   diazepam, haloperidol lactate, morphine injection, polyethylene glycol, sodium chloride, traMADol  Family History: family history is not on file.   Social History:   reports that he has never smoked. He does not have any smokeless tobacco history on file. He reports that he does not drink alcohol or use illicit drugs.  Review of Systems: Unable to obtain - patient difficult to arouse   Physical Examination: BP 156/73 mmHg  Pulse 112  Temp(Src) 97.6 F (36.4 C) (Oral)  Resp 19  Ht _0  (1.88 m)  Wt 83.507 kg (184 lb 1.6 oz)  BMI 23.63 kg/m2  SpO2 100% Gen: NAD, asleep in bed, difficult to arouse, no family present at bedside HEENT: PEERLA, EOMI Neck: supple, no JVD or thyromegaly Chest: CTA bilaterally, no wheezes, crackles, or other adventitious sounds CV: RRR, no m/g/c/r Abd: soft, ND, NT, +BS in all four quadrants; no HSM, guarding, ridigity, or rebound tenderness Ext: trace edema, open wounds on lower extremities  bilaterally, severe ulceration present on right leg requiring dressing Skin: Sacral decubitus ulcer, stage 2 Lymph: no LAD  Data: Lab Results  Component Value Date   WBC 9.3 02/24/2015   HGB 12.1* 02/24/2015   HCT 36.5* 02/24/2015   MCV 98.4 02/24/2015   PLT 211 02/27/2015    Recent Labs Lab 02/24/15 0214  HGB 12.1*   Lab Results  Component Value Date   NA 139 02/27/2015   K 4.2 02/27/2015   CL 109 02/27/2015   CO2 26 02/27/2015   BUN 10 02/27/2015   CREATININE 0.57* 02/27/2015   Lab Results  Component Value Date   ALT 34 02/23/2015   AST 72* 02/23/2015   ALKPHOS 224* 02/23/2015   BILITOT 1.4* 02/23/2015   No results for input(s): APTT, INR, PTT in the last 168 hours. Assessment/Plan: Mr. Postlewait is a 81 y.o. male  a history of CHF, Afib, and HTN with oropharyngeal phase dysphagia and esophageal dysmotility demonstrated on modified barium swallow.  Patient is currently being treated for multiple serious medical issues and has been unable to  maintain adequate nutrition despite a modified dysphagia diet and nutrition supplementation.  His care team is recommending the family consider a PEG tube, as well as the risks and benefits of continued aggressive treatment vs hospice.  He is at a high risk for recurrent aspiration PNA.  A repeat barium swallow is recommended to further evaluate dysphagia.  If barium swallow returns normal, would agree with PEG tube recommendation as oropharyngeal dysphagia points to a neurological issue.  His esophagus could also be checked during PEG placement.  EGD would only be considered if barium swallow showed a cause of dysphagia that could be fixed, such as stricture or webs.  Recommendations: - Barium swallow ordered, further recs pending results - Consider PEG tube placement if barium swallow normal  Thank you for the consult. We will follow along with you. Please call with questions or concerns.  Lavera Guise, PA-C

## 2015-03-01 NOTE — Progress Notes (Signed)
Patient has been alert when awakened, sleeping in between care. Oriented to self and that he is in the hospital. Afib on tele. Patient has had two 6 beat runs of vtach, MD is aware. Dressings on bilateral legs and sacrum have been changed and wounds cleansed. Patient has complained of pain throughout the day on sacrum, worsens when sitting up to eat. Patient has been encouraged to eat for all three meals. Patient did not eat much breakfast, skipped lunch completely, and ate about 30-40% of dinner including liquids. GI consult added today, potentially doing a barium swallow. Will continue to monitor.

## 2015-03-01 NOTE — Progress Notes (Signed)
I have seen Ronald Good and agree with Marcelino DusterMichelle Johnson's a/p.   He seems to have significant dementia and modified barium swallow shows OP dysphagia and aspiration.   Suspect the dysphagia is all OP and related to dementia but will obtain barium swallow to eval for any esophageal pathology.  EGD would likely be low yield.   Recs: - obtain full barium swallow - recommend palliative care consult, PEG tubes do not prevent aspiration or improve quality of life in dementia.

## 2015-03-01 NOTE — Progress Notes (Signed)
   03/01/15 1140  Clinical Encounter Type  Visited With Patient and family together  Visit Type Initial  Referral From Nurse  Consult/Referral To Chaplain  Spiritual Encounters  Spiritual Needs Literature;Prayer;Emotional  Stress Factors  Patient Stress Factors Health changes  Family Stress Factors Health changes;Major life changes  Met w/patient & family. Spouse advised they previously completed an AD. The original is at their residence. After checking system & staff, spouse was asked to bring a copy of the AD for patient's chart. Chap. Pedram Goodchild G. [place]

## 2015-03-01 NOTE — Progress Notes (Signed)
I discussed with wife and son in detail regarding goals of care and code status.  Patient has had recurrent admissions since November 2015 with slow deterioration in health. He is high risk for further aspiration and pneumonias.  Explained to family regarding his dysphagia. Wife says patient would not want to be on Ventilator and did not want resuscitation. Wanted me to change to DNR/DNI. Also consult GI.  They want to look at options to avoid PEG.  Time spent > 35 minutes with > 50% time spent in discussing and answering all questions with family.

## 2015-03-01 NOTE — Progress Notes (Signed)
[facility] [place] at [facility]   PATIENT NAME: Ronald Good    MR#:  [id number]  DATE OF BIRTH:  07-15-1934  SUBJECTIVE:  CHIEF COMPLAINT:   Chief Complaint  Patient presents with  . Altered Mental Status  s/p Angio bilateral lower extremities and Stents. Modified Barium swallow study revealed significant dysphagia. Now on dysphagia diet.  Nonsustained V. tach , SVT intermittently , seen by Dr. Lady GaryFath,  recommended to continue amiodarone. Mental status has improved but still with confusion.  Continues to have dysphagia, confused. SOB, Weak. Wife and son at bedside. REVIEW OF SYSTEMS:  Review of Systems  Constitutional: Positive for malaise/fatigue. Negative for fever and chills.  HENT: Negative for sore throat.   Eyes: Negative for blurred vision, double vision and pain.  Respiratory: Negative for cough, hemoptysis, shortness of breath and wheezing.   Cardiovascular: Negative for chest pain, palpitations, orthopnea and leg swelling.  Gastrointestinal: Negative for heartburn, nausea, vomiting, abdominal pain, diarrhea and constipation.  Genitourinary: Negative for dysuria and hematuria.  Musculoskeletal: Positive for back pain. Negative for joint pain.  Skin: Negative for rash.       ulcers  Neurological: Positive for sensory change and weakness. Negative for speech change, focal weakness and headaches.  Endo/Heme/Allergies: Does not bruise/bleed easily.  Psychiatric/Behavioral: Negative for depression. The patient is not nervous/anxious.    DRUG ALLERGIES:   Allergies  Allergen Reactions  . Oxycodone Other (See Comments)    "makes me loopy"  . Neomycin-Bacitracin Zn-Polymyx Rash    VITALS:  Blood pressure 156/73, pulse 112, temperature 97.6 F (36.4 C), temperature source Oral, resp. rate 19, height 6\' 2"  (1.88 m), weight 83.507 kg (184 lb 1.6 oz), SpO2 100 %.  PHYSICAL EXAMINATION:  GENERAL:  81 y.o.-year-old patient lying in the bed, more   responsive today is able to converse , following commands . Oral mucosa is relatively moist EYES: Pupils equal, round, reactive to light and accommodation. No scleral icterus. Extraocular muscles intact.  HEENT: Head atraumatic, normocephalic. Oropharynx and nasopharynx clear. More moist . Oral mucosa. NECK:  Supple, no jugular venous distention. No thyroid enlargement, no tenderness.  LUNGS: Normal breath sounds bilaterally, no wheezing, rales,rhonchi or crepitation,better inspiratory  effort. No use of accessory muscles of respiration.  CARDIOVASCULAR: S1, S2 normal. No murmurs, rubs, or gallops.  ABDOMEN: Soft, tender in lower abdomen, mostly suprapubically on the right side, nondistended. Bowel sounds present. No organomegaly or mass.  EXTREMITIES: trace pedal edema, open wounds were noted in the anterior shins in lower part, Extensive ulcerations in the anterior aspect of the right lower extremity. No significant drainage dressing has been changed. No infection signs were noted.   Severe pain on manipulation of the patient's feet. Good peripheral pulses on the left. Diminished on the right NEUROLOGIC: Cranial nerves II through XII are intact. Muscle strength 4/5 in all extremities. Sensation intact. Gait not checked.  PSYCHIATRIC: The patient is alert. Confused. SKIN: Sacral DU stage 2. Leg wound in dressing.   LABORATORY PANEL:   CBC  Recent Labs Lab 02/24/15 0214 02/27/15 0530  WBC 9.3  --   HGB 12.1*  --   HCT 36.5*  --   PLT 190 211   ------------------------------------------------------------------------------------------------------------------  Chemistries   Recent Labs Lab 02/23/15 1619  02/27/15 0530  NA 143  < > 139  K 3.7  < > 4.2  CL 115*  < > 109  CO2 24  < > 26  GLUCOSE 120*  < >  95  BUN 13  < > 10  CREATININE 0.79  < > 0.57*  CALCIUM 7.4*  < > 7.5*  MG  --   < > 1.7  AST 72*  --   --   ALT 34  --   --   ALKPHOS 224*  --   --   BILITOT 1.4*  --   --    < > = values in this interval not displayed. ------------------------------------------------------------------------------------------------------------------  Cardiac Enzymes No results for input(s): TROPONINI in the last 168 hours. ------------------------------------------------------------------------------------------------------------------  RADIOLOGY:  No results found.  EKG:   Orders placed or performed during the hospital encounter of 02/17/15  . EKG 12-Lead  . EKG 12-Lead    ASSESSMENT AND PLAN:   * Dysphagia Patient is high risk for aspiration. Consult GI to see if EGD would be an option. Patient unable to meet calorie and fluid requirement. Will have to look at [facility] or Hospice if EGD is not an option. Will wait for GI input.  * Peripheral arterial disease  s/p left lower extremity angiogram. Awaiting right lower extremity angiogram on Monday. Help with healing of his lower extremity infection and ulcers  * HCAP with acute respiratory failure Improving. Wean oxygen On antibiotics  * Pseudomonas and Proteus bacteremia   on IV ciprofloxacin. Source is lower extremity wounds Switch to PO for total 2 weeks at discharge.  * Septic shock has resolved  *  Acute renal failure due to ATN   Resolved with IV fluids  * Hypernatremia.  Due to dehydration . Resolved with IV fluids   * Sacral decubitus ulcer stage 2 and infected leg wounds. Wound care appreciated.  * AFib with episodes of short to V. tach and SVT few days ago Continue amiodarone and resume pradaxa after procedures are performed  * Chronic systolic CHF. Stable  * Acute inpatient delirium due to acute illness Improved cntinue Haldol as needed every 6 hours and follow his condition, stable at present  * Suprapubic abdominal pain  CT scan of abdomen and pelvis showed nothing acute  * Severe protein calorie malnutrition Unable to eat due to dementia and also chronic dysphagia.  All the records  are reviewed and case discussed with Care Management/Social Workerr. Management plans discussed with the  family and they are in agreement.  CODE STATUS: DNR   TOTAL TIME OF TAKING CARE OF THIS PATIENT: 35 minutes.    Milagros Loll R M.D on 03/01/2015 at 11:21 AM  Between 7am to 6pm - Pager - [phone]  After 6pm go to [email or web address] - password [email or web address] [facility]  [place] [facility]  Office  [phone]  CC: Primary care physician; Marisue Ivan, MD

## 2015-03-01 NOTE — Progress Notes (Signed)
Patient had a 6 beat run of vtach. Dr. Elpidio AnisSudini notified.

## 2015-03-02 ENCOUNTER — Inpatient Hospital Stay: Payer: Commercial Managed Care - HMO

## 2015-03-02 NOTE — Progress Notes (Signed)
GI Note:  Barium swallow normal.  unlikley to find cause of dysphagia on EGD.   Would not perform EGD for dysphagia in setting of normal barium swallow and know OP dysphagia and aspiration.  Will await discussion with primary team and palliative care whether PEG is desired although would recommend palliatve measures instead of PEG in setting of dementia.

## 2015-03-02 NOTE — Progress Notes (Signed)
Patient off floor for study, likely his Barium swallow Can follow up in the office in 2-3 weeks to assess legs to see if they are salvageable No other vascular recs

## 2015-03-02 NOTE — Progress Notes (Signed)
Patient condition unchanged, PRN pain med administer time 2, family visited, pt assisted with meals, VSS, mood calm, will continue to monitor.

## 2015-03-02 NOTE — Progress Notes (Signed)
[facility] - [place] at [facility]   PATIENT NAME: Ronald Good    MR#:  [id number]  DATE OF BIRTH:  09-Feb-1934  SUBJECTIVE:  CHIEF COMPLAINT:   Chief Complaint  Patient presents with  . Altered Mental Status  s/p Angio bilateral lower extremities and Stents. Modified Barium swallow study revealed significant dysphagia. Now on dysphagia diet.  Nonsustained V. tach , SVT intermittently , seen by Dr. Lady Gary,  recommended to continue amiodarone. Mental status has improved but still with confusion.  Continues to have dysphagia, confused. SOB, Weak. Wife and son at bedside. No change. Waiting for barium swallow. REVIEW OF SYSTEMS:  Review of Systems  Constitutional: Positive for malaise/fatigue. Negative for fever and chills.  HENT: Negative for sore throat.   Eyes: Negative for blurred vision, double vision and pain.  Respiratory: Negative for cough, hemoptysis, shortness of breath and wheezing.   Cardiovascular: Negative for chest pain, palpitations, orthopnea and leg swelling.  Gastrointestinal: Negative for heartburn, nausea, vomiting, abdominal pain, diarrhea and constipation.  Genitourinary: Negative for dysuria and hematuria.  Musculoskeletal: Positive for back pain. Negative for joint pain.  Skin: Negative for rash.       ulcers  Neurological: Positive for sensory change and weakness. Negative for speech change, focal weakness and headaches.  Endo/Heme/Allergies: Does not bruise/bleed easily.  Psychiatric/Behavioral: Negative for depression. The patient is not nervous/anxious.    DRUG ALLERGIES:   Allergies  Allergen Reactions  . Oxycodone Other (See Comments)    "makes me loopy"  . Neomycin-Bacitracin Zn-Polymyx Rash    VITALS:  Blood pressure 118/74, pulse 98, temperature 98 F (36.7 C), temperature source Oral, resp. rate 20, height  (1.88 m), weight 84.777 kg (186 lb 14.4 oz), SpO2 100 %.  PHYSICAL EXAMINATION:  GENERAL:  81  y.o.-year-old patient lying in the bed, more  responsive today is able to converse , following commands . Oral mucosa is relatively moist EYES: Pupils equal, round, reactive to light and accommodation. No scleral icterus. Extraocular muscles intact.  HEENT: Head atraumatic, normocephalic. Oropharynx and nasopharynx clear. More moist . Oral mucosa. NECK:  Supple, no jugular venous distention. No thyroid enlargement, no tenderness.  LUNGS: Normal breath sounds bilaterally, no wheezing, rales,rhonchi or crepitation,better inspiratory  effort. No use of accessory muscles of respiration.  CARDIOVASCULAR: S1, S2 normal. No murmurs, rubs, or gallops.  ABDOMEN: Soft, tender in lower abdomen, mostly suprapubically on the right side, nondistended. Bowel sounds present. No organomegaly or mass.  EXTREMITIES: trace pedal edema, open wounds were noted in the anterior shins in lower part, Extensive ulcerations in the anterior aspect of the right lower extremity. No significant drainage dressing has been changed. No infection signs were noted.   Severe pain on manipulation of the patient's feet. Good peripheral pulses on the left. Diminished on the right NEUROLOGIC: Cranial nerves II through XII are intact. Muscle strength 4/5 in all extremities. Sensation intact. Gait not checked.  PSYCHIATRIC: The patient is alert. Confused. SKIN: Sacral DU stage 2. Leg wound in dressing.   LABORATORY PANEL:   CBC  Recent Labs Lab 02/24/15 0214 02/27/15 0530  WBC 9.3  --   HGB 12.1*  --   HCT 36.5*  --   PLT 190 211   ------------------------------------------------------------------------------------------------------------------  Chemistries   Recent Labs Lab 02/23/15 1619  02/27/15 0530  NA 143  < > 139  K 3.7  < > 4.2  CL 115*  < > 109  CO2 24  < > 26  GLUCOSE 120*  < > 95  BUN 13  < > 10  CREATININE 0.79  < > 0.57*  CALCIUM 7.4*  < > 7.5*  MG  --   < > 1.7  AST 72*  --   --   ALT 34  --   --    ALKPHOS 224*  --   --   BILITOT 1.4*  --   --   < > = values in this interval not displayed. ------------------------------------------------------------------------------------------------------------------  Cardiac Enzymes No results for input(s): TROPONINI in the last 168 hours. ------------------------------------------------------------------------------------------------------------------  RADIOLOGY:  No results found.  EKG:   Orders placed or performed during the hospital encounter of 02/17/15  . EKG 12-Lead  . EKG 12-Lead    ASSESSMENT AND PLAN:   * Dysphagia Patient is high risk for aspiration. Consult GI to see if EGD would be an option. Patient unable to meet calorie and fluid requirement. [facility] on barium swallow results.  * Peripheral arterial disease  s/p left lower extremity angiogram. Awaiting right lower extremity angiogram on Monday. Help with healing of his lower extremity infection and ulcers  * HCAP with acute respiratory failure Improving. Wean oxygen On antibiotics  * Pseudomonas and Proteus bacteremia   on IV ciprofloxacin. Source is lower extremity wounds Switch to PO for total 2 weeks at discharge.  * Septic shock has resolved  *  Acute renal failure due to ATN   Resolved with IV fluids  * Hypernatremia.  Due to dehydration . Resolved with IV fluids   * Sacral decubitus ulcer stage 2 and infected leg wounds. Wound care appreciated.  * AFib with episodes of short to V. tach and SVT few days ago Continue amiodarone and resume pradaxa after procedures are performed  * Chronic systolic CHF. Stable  * Acute inpatient delirium due to acute illness Improved cntinue Haldol as needed every 6 hours and follow his condition, stable at present  * Suprapubic abdominal pain  CT scan of abdomen and pelvis showed nothing acute  * Severe protein calorie malnutrition Unable to eat due to dementia and also  chronic dysphagia.  All the records are reviewed and case discussed with Care Management/Social Workerr. Management plans discussed with the  family and they are in agreement.  CODE STATUS: DNR   TOTAL TIME OF TAKING CARE OF THIS PATIENT: 40 minutes.  > 50% time spent in co-ordination of care and discussing with patient and family at bedside.  FAMILY MEETING SETUP TOMORROW AT 11 AM REGARDING PEG/SNF/HOSPICE.    Milagros LollSudini, Kiyra Slaubaugh R M.D on 03/02/2015 at 11:55 AM  Between 7am to 6pm - Pager - [phone]  After 6pm go to [email or web address] - password [email or web address] [email or web address]  [facility]  Office  [phone]  CC: Primary care physician; Marisue IvanLINTHAVONG, KANHKA, MD

## 2015-03-03 LAB — PLATELET COUNT: Platelets: 235 10*3/uL (ref 150–440)

## 2015-03-03 MED ORDER — POLYETHYLENE GLYCOL 3350 17 G PO PACK
17.0000 g | PACK | Freq: Every day | ORAL | Status: DC
  Administered 2015-03-04: 17 g via ORAL

## 2015-03-03 MED ORDER — OXYCODONE HCL 5 MG/5ML PO SOLN
5.0000 mg | ORAL | Status: DC | PRN
  Administered 2015-03-04 (×3): 5 mg via ORAL
  Filled 2015-03-03 (×3): qty 5

## 2015-03-03 NOTE — Progress Notes (Addendum)
New Hospice Home referral received from [place]  Family meeting with attending physician Dr. Darvin Neighbours.  Ronald Good is a 81 year old man who was admitted on 02/18/2015 for treatment of hypoxia, low blood sugar and hypotension. He was found to have ARF d/t dehydration as well as pneumonia, he was previously hospitalized in 01/2015 for bilateral lower leg cellulitis which has now become gangrenous. PMH includes: PAD, CHF, Afib, HTN, AAA, GERD and ARF. This hospital course he has received IV antibiotics and IVF and continued to show decline. Appetite is poor, albumin 1.7 and per chart review patient has lost 20 lbs since June. He is a DNR.  Patient seen prior to meeting with the family, he was asleep, easily awakened, oriented to self, loose productive cough noted. Foley draining clear amber urine. Dressing intact to BLE, legs dusky, toes on both feet appear purple/black with dark scabbed ulcerations. Patient also has a stage III pressure ulcer to his sacrum and stage II pressure ulcer to his R buttocks.  Writer met in the room with patient's wife, daughter Ronald Good, son Ronald Good and via conference call with his daughter Ronald Good and son in law Ronald Good. Education initiated regarding hospice services at home vs SNF vs hospice home, with focus on the hospice philosophy of comfort and team approach to care. Questions answered, code status confirmed as DNR. Family plans to talk this evening and will notify writer of decision. CMRN Nann made aware of this plan. Thank you for the opportunity to serve [name] and his family.  Flo Shanks RN, BSN, [place] [facility] Liaison [phone]

## 2015-03-03 NOTE — Progress Notes (Signed)
[facility] Phys[facility] - [place] at [facility]   PATIENT NAME: Ronald Good    MR#:  [id number]  DATE OF BIRTH:  1933-12-17  SUBJECTIVE:  CHIEF COMPLAINT:   Chief Complaint  Patient presents with  . Altered Mental Status  s/p Angio bilateral lower extremities and Stents. Modified Barium swallow study revealed significant dysphagia. Now on dysphagia diet.  Nonsustained V. tach , SVT intermittently , seen by Dr. Lady Gary,  recommended to continue amiodarone. Mental status has improved but still with confusion.  Continues to have dysphagia, confused. SOB, Weak. Wife and son at bedside. Off O2. Wating for daughter to arrive for family meeting at 11AM.  REVIEW OF SYSTEMS:  Review of Systems  Constitutional: Positive for malaise/fatigue. Negative for fever and chills.  HENT: Negative for sore throat.   Eyes: Negative for blurred vision, double vision and pain.  Respiratory: Negative for cough, hemoptysis, shortness of breath and wheezing.   Cardiovascular: Negative for chest pain, palpitations, orthopnea and leg swelling.  Gastrointestinal: Negative for heartburn, nausea, vomiting, abdominal pain, diarrhea and constipation.  Genitourinary: Negative for dysuria and hematuria.  Musculoskeletal: Positive for back pain. Negative for joint pain.  Skin: Negative for rash.       ulcers  Neurological: Positive for sensory change and weakness. Negative for speech change, focal weakness and headaches.  Endo/Heme/Allergies: Does not bruise/bleed easily.  Psychiatric/Behavioral: Negative for depression. The patient is not nervous/anxious.    DRUG ALLERGIES:   Allergies  Allergen Reactions  . Oxycodone Other (See Comments)    "makes me loopy"  . Neomycin-Bacitracin Zn-Polymyx Rash    VITALS:  Blood pressure 118/74, pulse 98, temperature 98 F (36.7 C), temperature source Oral, resp. rate 20, height 6\' 2"  (1.88 m), weight 84.777 kg (186 lb 14.4 oz), SpO2 94 %.  PHYSICAL  EXAMINATION:  GENERAL:  82 y.o.-year-old patient lying in the bed, more  responsive today is able to converse , following commands . Oral mucosa is relatively moist EYES: Pupils equal, round, reactive to light and accommodation. No scleral icterus. Extraocular muscles intact.  HEENT: Head atraumatic, normocephalic. Oropharynx and nasopharynx clear. More moist . Oral mucosa. NECK:  Supple, no jugular venous distention. No thyroid enlargement, no tenderness.  LUNGS: Normal breath sounds bilaterally, no wheezing, rales,rhonchi or crepitation,better inspiratory  effort. No use of accessory muscles of respiration.  CARDIOVASCULAR: S1, S2 normal. No murmurs, rubs, or gallops.  ABDOMEN: Soft, tender in lower abdomen, mostly suprapubically on the right side, nondistended. Bowel sounds present. No organomegaly or mass.  EXTREMITIES: trace pedal edema, open wounds were noted in the anterior shins in lower part, Extensive ulcerations in the anterior aspect of the right lower extremity. No significant drainage dressing has been changed. No infection signs were noted.   Severe pain on manipulation of the patient's feet. Good peripheral pulses on the left. Diminished on the right NEUROLOGIC: Cranial nerves II through XII are intact. Muscle strength 4/5 in all extremities. Sensation intact. Gait not checked.  PSYCHIATRIC: The patient is alert. Confused. SKIN: Sacral DU stage 2. Leg wound in dressing.   LABORATORY PANEL:   CBC  Recent Labs Lab 02/27/15 0530  PLT 211   ------------------------------------------------------------------------------------------------------------------  Chemistries   Recent Labs Lab 02/27/15 0530  NA 139  K 4.2  CL 109  CO2 26  GLUCOSE 95  BUN 10  CREATININE 0.57*  CALCIUM 7.5*  MG 1.7   ------------------------------------------------------------------------------------------------------------------  Cardiac Enzymes No results for input(s): TROPONINI in the  last 168 hours. ------------------------------------------------------------------------------------------------------------------  RADIOLOGY:  Dg Esophagus  03/02/2015   CLINICAL DATA:  Dysphagia  EXAM: ESOPHOGRAM/BARIUM SWALLOW  TECHNIQUE: Single contrast examination was performed using  thin barium.  FLUOROSCOPY TIME:  Fluoroscopy Time:  1 minutes, 18 seconds  Number of Acquired Images:  4  COMPARISON:  None.  FINDINGS: The study was performed in the semi supine position. The patient was only able to weakly drink through a straw. The patient did ingest small amounts of barium with each swallow. There was no laryngeal penetration of the barium. The thoracic esophagus distended reasonably well with the barium bolus. The barium passed promptly into the stomach. No reflux was observed. There was no evidence of stricture.  IMPRESSION: The patient ingested a small amount of the thin barium without evidence of aspiration nor esophageal obstruction. Esophageal motility was normal within the limitations of the study.   Electronically Signed   By: David  [place] M.D.   On: 03/02/2015 13:10    EKG:   Orders placed or performed during the hospital encounter of 02/17/15  . EKG 12-Lead  . EKG 12-Lead    ASSESSMENT AND PLAN:   * Dysphagia Patient is high risk for aspiration. Barium swallow shoed no lesions that would benefit from EGD. Discussed with Dr. Shelle Iron. His dysphagia is likely from dementia. Family meeting to decide regarding PEG.  * Peripheral arterial disease  s/p bilateral lower extremity angiogram. Help with healing of his lower extremity infection and ulcers.  * HCAP with acute respiratory failure Improving. Wean oxygen On antibiotics  * Pseudomonas and Proteus bacteremia   on PO ciprofloxacin till 03/07/2015. Source is lower extremity wounds. Switch to PO for total 2 weeks at discharge.  * Septic shock has resolved  *  Acute renal failure due to ATN   Resolved with IV  fluids  * Hypernatremia.  Due to dehydration . Resolved with IV fluids   * Sacral decubitus ulcer stage 2 and infected leg wounds. Wound care appreciated.  * AFib with episodes of short to V. tach and SVT few days ago Continue amiodarone and resume pradaxa after procedures are performed  * Chronic systolic CHF. Stable  * Acute inpatient delirium due to acute illness Improved cntinue Haldol as needed every 6 hours and follow his condition, stable at present  * Suprapubic abdominal pain  CT scan of abdomen and pelvis showed nothing acute  * Severe protein calorie malnutrition Unable to eat due to dementia and also chronic dysphagia.  All the records are reviewed and case discussed with Care Management/Social Workerr. Management plans discussed with the  family and they are in agreement.  CODE STATUS: DNR  TOTAL TIME OF TAKING CARE OF THIS PATIENT: 30 minutes.  > 50% time spent in co-ordination of care and discussing with patient and family at bedside.  FAMILY MEETING SETUP TODAY AT 40 AM.   Milagros Loll R M.D on 03/03/2015 at 10:27 AM  Between 7am to 6pm - Pager - [phone]  After 6pm go to [email or web address] - password [email or web address]  [place] [facility]  Office  [phone]  CC: Primary care physician; Marisue Ivan, MD

## 2015-03-03 NOTE — Progress Notes (Signed)
  Met with Patient's Wife, Son, Daughter, Son-in-Law in the room and another daughter and Son-in-law over the phone in patients room. Patient is confused, drowzy. Discussed regarding Barium swallow results and GI input. Also hospital course and prognosis after discharge. Explained high risk for recurrent infections and complications after discharge although patient seems to be responding at this point to his antibiotics. A PEG tube would not prevent aspiration as his dysphagia is due to dementia. Patient is DO NOT RESUSCITATE and DO NOT INTUBATE. He did not want any artificial life support. Family agrees that the tube would not be an option. They have inquired about hospice home transfer. We will consult hospice nurse for hospice screen.  Discussed with Case manager.  Time spent 35 minutes.

## 2015-03-03 NOTE — Care Management (Signed)
Attending and CM met with patient and family.  It is decided that family would like to speak with hospice representative to discuss transfer to [place].  Hospice nurse liaison to meet with family

## 2015-03-03 NOTE — Progress Notes (Signed)
MD Sudini was made aware of 13 beat run of v tach. No new orders.

## 2015-03-03 NOTE — Progress Notes (Signed)
Iso for MRSA. Takes meds crushed in apple sauce. Alert to self only. Family had a family meeting with pt and MD. Wounds to bilat leg were changed. Central line dressing was changed. Stage 2 on buttocks and sacral. 13 beat run of V tach was reported MD Sudinin. Foley. Pt has no further concerns at this time.

## 2015-03-03 NOTE — Progress Notes (Signed)
Speech Language Pathology Treatment: Dysphagia  Patient Details Name: Ronald Good MRN: [id number] DOB: [birth year] Today's Date: 03/02/15 Time: 1200-1240 SLP Time Calculation (min) (ACUTE ONLY): 40 min  Assessment / Plan / Recommendation Clinical Impression   Pt appear to be tolerating his current Dysphagia diet w/ Honey consistency liquids but requires strict aspiration precautions and feeding assistance. Pt is at increased risk for aspiration even of pharyngeal residue sec. to his oropharyngeal phase dysphagia as per MBSS performed this week. Pt has a calorie count in the room for monitoring of amount of oral intake d/t decline oral intake overall despite encouragement. Rec. continue w/ strict aspiration precautions and Dysphagia diet. ST will f/u w/ toleration of diet and education during week as nec. Met w/ Family and gave education/instruction on pt's aspiration precautions; options of po's; consistencies of po's. NSG consulted re: pt's status; updated.           HPI Other Pertinent Information: pt has been tolerating his current Dys. I w/ Honey consistency liquids(post results of MBSS recently) per NSG report. Pt has only been taking bites/sips per report; a calorie count has been posted per Dietician. Pt continues to present w/ some confusion and appears fatigued and weak. Pt requires max. encouragement to take po's and has only eaten an increased amount of food/supplements when fed by Wife. Pt c/o "trouble swallowing" at his meal last evening per MD note. Pt explained that he feels food "stops here" pointing to his lower throat area; he stated during po trials 1x "it's trying to come back up right now". Pt has 2-3 vomit bags around him in the bed and acknowledged he has to "use them for the phlegm". Pt stated he eats a "regular diet at home". He was able to describe softer foods. He denied having seen a GI MD for any Esophageal motility issues and stated he does not take any medication  for his stomach(pt does have Pantoprazole prescribed per chart note). Noted wounds on his LEs. MD arrived stated pt's protein level was low.    Pertinent Vitals Pain Assessment: No/denies pain  SLP Plan  Continue with current plan of care    Recommendations Diet recommendations: Dysphagia 1 (puree);Honey-thick liquid Liquids provided via: Teaspoon;Cup Medication Administration: Crushed with puree Supervision: Staff to assist with self feeding;Full supervision/cueing for compensatory strategies Compensations: Small sips/bites;Check for pocketing;Multiple dry swallows after each bite/sip;Follow solids with liquid Postural Changes and/or Swallow Maneuvers: Seated upright 90 degrees              General recommendations:  (may have f/u for Esophageal assessment/scan) Oral Care Recommendations: Oral care BID;Oral care before and after PO;Staff/trained caregiver to provide oral care Follow up Recommendations: Skilled Nursing facility (TBD) Plan: Continue with current plan of care    Viola, Br[place], CCC-SLP [facility] 03/02/15

## 2015-03-04 ENCOUNTER — Encounter: Payer: Self-pay | Admitting: Vascular Surgery

## 2015-03-04 MED ORDER — ATROPINE SULFATE 1 % OP SOLN
1.0000 [drp] | OPHTHALMIC | Status: DC | PRN
  Administered 2015-03-04: 1 [drp] via SUBLINGUAL
  Filled 2015-03-04: qty 2

## 2015-03-04 MED ORDER — CIPROFLOXACIN HCL 500 MG PO TABS: 500.0000 mg | ORAL_TABLET | Freq: Two times a day (BID) | ORAL | Status: AC

## 2015-03-04 MED ORDER — POLYETHYLENE GLYCOL 3350 17 G PO PACK: 17.0000 g | PACK | Freq: Every day | ORAL | Status: AC

## 2015-03-04 MED ORDER — MORPHINE SULFATE (CONCENTRATE) 10 MG /0.5 ML PO SOLN: 10.0000 mg | ORAL | Status: AC | PRN

## 2015-03-04 MED ORDER — ATROPINE SULFATE 1 % OP SOLN: 1.0000 [drp] | OPHTHALMIC | Status: AC | PRN

## 2015-03-04 MED ORDER — LORAZEPAM 1 MG PO TABS: 1.0000 mg | ORAL_TABLET | Freq: Four times a day (QID) | ORAL | Status: AC | PRN

## 2015-03-04 NOTE — Progress Notes (Signed)
MD was made aware of garbling noices. No further orders.

## 2015-03-04 NOTE — Progress Notes (Signed)
Central line removed. BM today. Dressing to bilat were changed. Dressing to sacral changed. Family updated of plan. [name] spoke to family. D/c to hospice home. EMS to take. Pt has no further concerns at this time.

## 2015-03-04 NOTE — Progress Notes (Signed)
Follow up visit made on new Hospice referral. Patient's son Ronald Good  and wife Ronald Good present in the room and advise that they have made the decision for Ronald Good to transfer to the hospice home for end of life/comfort care. Patient appears restless, with loose nonproductive cough and increased throaty congestion noted through out visit. Patient's wife and son report that the cough started after he ate breakfast "with the last bites". Education provided regarding aspiration with good understanding voiced by Ronald Good. Reassurance given to family that patient would still  be offered food at the hospice home.  Consents signed, staff RN Leslie Dales, CMRN Nann, CSW Morrie Sheldon and MD made aware of family decision to transfer to the Hospice home today. Report called to hospice home, EMS notified for transport. Thank you for the opportunity to serve Ronald Good and his family. Dayna Barker RN, BSN, [facility] [place] [phone] c

## 2015-03-04 NOTE — Progress Notes (Signed)
MD was made aware about hospice home selection. Needs something for secretions as well as need for d/c central line. No further orders. Leave foley in.

## 2015-03-04 NOTE — Progress Notes (Signed)
No change in patients status. Still confused. Poor oral intake. D/C IVF. D/C SNF vs Hospice home per family decision.

## 2015-03-04 NOTE — Progress Notes (Signed)
Speech Therapy Note:  NSG contacted SLP re: suspected aspiration during attempts at po's this morning; NSG reported pt had a wet vocal quality and was coughing. Suspect he is not clearing his pharyngeal secretions appropriately, thus aspiration is a high risk. Attempted to talk w/ pt and wife, however, [name], w/ Hospice services, is in the room talking w/ pt/wife. Understand Hospice services is being discussed. Will f/u w/ pt/family as indicated this PM. NSG agreed.

## 2015-03-08 NOTE — Discharge Summary (Signed)
[facility] [place] [facility]   PATIENT NAME: Ronald Good    MR#:  [id number]  DATE OF BIRTH:  1933/09/26  DATE OF ADMISSION:  02/17/2015 ADMITTING PHYSICIAN: Shaune Pollack, MD  DATE OF DISCHARGE: 03/04/2015 12:43 PM  PRIMARY CARE PHYSICIAN: Marisue Ivan, MD    ADMISSION DIAGNOSIS:  Dehydration [E86.0] Healthcare-associated pneumonia [J18.9] Sepsis, due to unspecified organism [A41.9] Acute renal failure, unspecified acute renal failure type [N17.9]  DISCHARGE DIAGNOSIS:  Principal Problem:   Pneumonia Active Problems:   ARF (acute renal failure)   Hypernatremia   Hypotension   Blood poisoning   Pressure ulcer   Protein-calorie malnutrition, severe   SECONDARY DIAGNOSIS:   Past Medical History  Diagnosis Date  . Chronic systolic congestive heart failure   . BPH (benign prostatic hyperplasia)   . Atrial fibrillation   . Essential hypertension   . Gastroesophageal reflux disease      ADMITTING HISTORY  Ronald Good is a 81 y.o. male with a known history of CHF, Afib, HTN and recent leg cellulitis. He was found to have hypoxia with SAT at 70% and low blood sugar at 40's in SNF. He is confused, only complaint of thirsty and weakness. BP dropped to 70/46 in ED. He is found to have ARF and dehydration. CXR: right pneumonia. He is being treated with NS bolus and antibiotics including vancomycin, zosyn and levaquin. He was hospitalized for leg cellulitis last month.   HOSPITAL COURSE:   * Dysphagia Patient is high risk for aspiration. Barium swallow showed no lesions that would benefit from EGD. Discussed with Dr. Shelle Iron. His dysphagia is likely from dementia. PEG would not help with preventing aspiration or longterm prognosis. Family did not want a PEG to respect patient's wishes expressed previously.  * Peripheral arterial disease  s/p bilateral lower extremity angiogram. Help with healing of his lower extremity infection and  ulcers. But overall extensive wounds which would be difficult to heal.  * HCAP with acute respiratory failure Improving. Wean oxygen On antibiotics po after discharge  * Pseudomonas and Proteus bacteremia  on PO ciprofloxacin till 03/07/2015. Source is lower extremity wounds. Switched to PO for total 2 weeks at discharge.  * Septic shock has resolved  * Acute renal failure due to ATN  Resolved with IV fluids  * Hypernatremia.  Due to dehydration . Resolved with IV fluids   * Sacral decubitus ulcer stage 2 and infected leg wounds. Wound care appreciated.  * AFib with episodes of short to V. tach and SVT few days ago Continue amiodarone and resume pradaxa after procedures are performed  * Chronic systolic CHF. Stable  * Acute inpatient delirium due to acute illness  * Suprapubic abdominal pain CT scan of abdomen and pelvis showed nothing acute  * Severe protein calorie malnutrition Unable to eat due to dementia and also chronic dysphagia.  Discharged to Hospice home as requested by wife after discussing with other family members  CONSULTS OBTAINED:  Treatment Team:  Dalia Heading, MD Elnita Maxwell, MD  DRUG ALLERGIES:   Allergies  Allergen Reactions  . Oxycodone Other (See Comments)    "makes me loopy"  . Neomycin-Bacitracin Zn-Polymyx Rash    DISCHARGE MEDICATIONS:   Discharge Medication List as of 03/04/2015 12:01 PM    START taking these medications   Details  atropine 1 % ophthalmic solution Place 1 drop under the tongue every 2 (two) hours as needed (secretions)., Starting 03/04/2015, Until Discontinued, No Print  ciprofloxacin (CIPRO) 500 MG tablet Take 1 tablet (500 mg total) by mouth 2 (two) times daily., Starting 03/04/2015, Until Discontinued, Print    LORazepam (ATIVAN) 1 MG tablet Take 1 tablet (1 mg total) by mouth every 6 (six) hours as needed for anxiety., Starting 03/04/2015, Until Discontinued, Print    Morphine Sulfate (MORPHINE  CONCENTRATE) 10 mg / 0.5 ml concentrated solution Take 0.5 mLs (10 mg total) by mouth every 3 (three) hours as needed for moderate pain, severe pain or shortness of breath., Starting 03/04/2015, Until Discontinued, Print      CONTINUE these medications which have CHANGED   Details  polyethylene glycol (MIRALAX / GLYCOLAX) packet Take 17 g by mouth daily., Starting 03/04/2015, Until Discontinued, No Print      CONTINUE these medications which have NOT CHANGED   Details  amiodarone (PACERONE) 200 MG tablet Take 200 mg by mouth daily., Starting 11/19/2014, Until Discontinued, Historical Med      STOP taking these medications     dabigatran (PRADAXA) 150 MG CAPS capsule      furosemide (LASIX) 20 MG tablet      metolazone (ZAROXOLYN) 5 MG tablet      metoprolol succinate (TOPROL-XL) 50 MG 24 hr tablet      oxyCODONE-acetaminophen (PERCOCET/ROXICET) 5-325 MG per tablet      pantoprazole (PROTONIX) 20 MG tablet      potassium chloride SA (K-DUR,KLOR-CON) 20 MEQ tablet      tamsulosin (FLOMAX) 0.4 MG CAPS capsule      traMADol (ULTRAM) 50 MG tablet      albuterol (VENTOLIN HFA) 108 (90 BASE) MCG/ACT inhaler      traMADol (ULTRAM) 50 MG tablet          Today    VITAL SIGNS:  Blood pressure 110/60, pulse 97, temperature 97.5 F (36.4 C), temperature source Oral, resp. rate 20, height 6\' 2"  (1.88 m), weight 84.823 kg (187 lb), SpO2 95 %.  I/O:  No intake or output data in the 24 hours ending Mar 18, 2015 1741  PHYSICAL EXAMINATION:  Physical Exam  General - critically ill looking old Caucasian male patient lying in bed confused Cardiac - S1 and S2 Pulmonary - lungs coarse breath sounds on both sides - Extremities - extensive bilateral ulcerations of legs with dry gangrenous areas.  DATA REVIEW:   CBC  Recent Labs Lab 03/03/15 1220  PLT 235    Chemistries  No results for input(s): NA, K, CL, CO2, GLUCOSE, BUN, CREATININE, CALCIUM, MG, AST, ALT, ALKPHOS, BILITOT in  the last 168 hours.  Invalid input(s): GFRCGP  Cardiac Enzymes No results for input(s): TROPONINI in the last 168 hours.  Microbiology Results  Results for orders placed or performed during the hospital encounter of 02/17/15  Blood culture (routine x 2)     Status: None   Collection Time: 02/17/15  2:30 AM  Result Value Ref Range Status   Specimen Description BLOOD LEFT WRIST  Final   Special Requests BOTTLES DRAWN AEROBIC AND ANAEROBIC  Final   Culture  Setup Time   Final    GRAM NEGATIVE RODS ANAEROBIC BOTTLE ONLY CRITICAL RESULT CALLED TO, READ BACK BY AND VERIFIED WITH: MICHELLE WILLIAMS AT 2026 02/17/15 SDR CONFIRMED BY SFW    Culture   Final    PROTEUS MIRABILIS PSEUDOMONAS AERUGINOSA ANAEROBIC BOTTLE ONLY COAGULASE NEGATIVE STAPHYLOCOCCUS AEROBIC BOTTLE ONLY    Report Status 02/24/2015 FINAL  Final   Organism ID, Bacteria PROTEUS MIRABILIS  Final   Organism ID,  Bacteria PSEUDOMONAS AERUGINOSA  Final      Susceptibility   Proteus mirabilis - MIC*    AMPICILLIN >=32 RESISTANT Resistant     CEFTAZIDIME <=1 SENSITIVE Sensitive     CEFAZOLIN 8 SENSITIVE Sensitive     CEFTRIAXONE <=1 SENSITIVE Sensitive     CIPROFLOXACIN <=0.25 SENSITIVE Sensitive     GENTAMICIN >=16 RESISTANT Resistant     IMIPENEM 8 INTERMEDIATE Intermediate     TRIMETH/SULFA <=20 SENSITIVE Sensitive     * PROTEUS MIRABILIS   Pseudomonas aeruginosa - MIC*    CEFTAZIDIME 4 SENSITIVE Sensitive     CIPROFLOXACIN <=0.25 SENSITIVE Sensitive     GENTAMICIN <=1 SENSITIVE Sensitive     IMIPENEM 2 SENSITIVE Sensitive     PIP/TAZO Value in next row Sensitive      SENSITIVE8    * PSEUDOMONAS AERUGINOSA  Blood culture (routine x 2)     Status: None   Collection Time: 02/17/15  2:53 AM  Result Value Ref Range Status   Specimen Description BLOOD LEFT HAND  Final   Special Requests BOTTLES DRAWN AEROBIC AND ANAEROBIC  Final   Culture  Setup Time   Final    GRAM NEGATIVE RODS AEROBIC BOTTLE  ONLY CRITICAL RESULT CALLED TO, READ BACK BY AND VERIFIED WITH: MICHELLE WILLIAMS @0451  02/18/15 BY AJO CONFIRMED BY [facility]    Culture PROTEUS MIRABILIS AEROBIC BOTTLE ONLY   Final   Report Status 02/22/2015 FINAL  Final   Organism ID, Bacteria PROTEUS MIRABILIS  Final      Susceptibility   Proteus mirabilis - MIC*    AMPICILLIN >=32 RESISTANT Resistant     CEFTAZIDIME <=1 SENSITIVE Sensitive     CEFAZOLIN 8 SENSITIVE Sensitive     CEFTRIAXONE <=1 SENSITIVE Sensitive     CIPROFLOXACIN <=0.25 SENSITIVE Sensitive     GENTAMICIN >=16 RESISTANT Resistant     IMIPENEM 2 SENSITIVE Sensitive     TRIMETH/SULFA <=20 SENSITIVE Sensitive     * PROTEUS MIRABILIS  MRSA PCR Screening     Status: Abnormal   Collection Time: 02/17/15  8:45 AM  Result Value Ref Range Status   MRSA by PCR POSITIVE (A) NEGATIVE Final    Comment:        The GeneXpert MRSA Assay (FDA approved for NASAL specimens only), is one component of a comprehensive MRSA colonization surveillance program. It is not intended to diagnose MRSA infection nor to guide or monitor treatment for MRSA infections. CRITICAL RESULT CALLED TO, READ BACK BY AND VERIFIED WITH: STACY COLLIE @ [phone] 02/17/15 [facility]   C difficile quick scan w PCR reflex ([facility] only)     Status: None   Collection Time: 02/18/15 11:39 AM  Result Value Ref Range Status   C Diff antigen NEGATIVE  Final   C Diff toxin NEGATIVE  Final   C Diff interpretation Negative for C. difficile  Final    RADIOLOGY:  No results found.    Follow up at hospice home  Management plans discussed with the patient, family and they are in agreement.  CODE STATUS: DNR/DNI  TOTAL TIME TAKING CARE OF THIS PATIENT ON DAY OF DISCHARGE: more than 30 minutes.    Milagros Loll R M.D on 29-Mar-2015 at 5:41 PM  Between 7am to 6pm - Pager - [phone]  After 6pm go to [email or web address] - password [email or web address] [facility]  [facility]  Office  [phone]  CC: Primary care  physician; Marisue Ivan, MD

## 2015-03-14 DEATH — deceased

## 2016-05-20 IMAGING — US US RENAL
1 series · 14 of 25 positions shown · non-contrast
Comparison: CT abdomen and pelvis July 27, 2014

CLINICAL DATA: Acute renal failure

EXAM:
RENAL ULTRASOUND

[Series 1: us renal · 0.27mm/px · 14 of 64 slices shown]
[im 1/64]
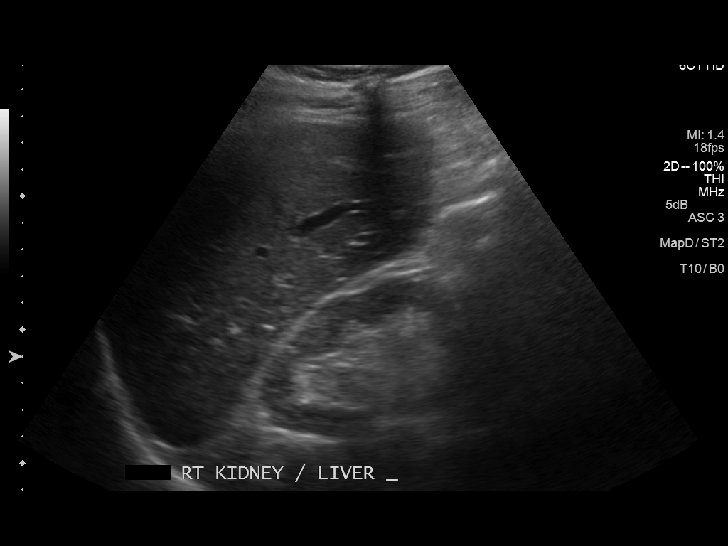
[im 6/64]
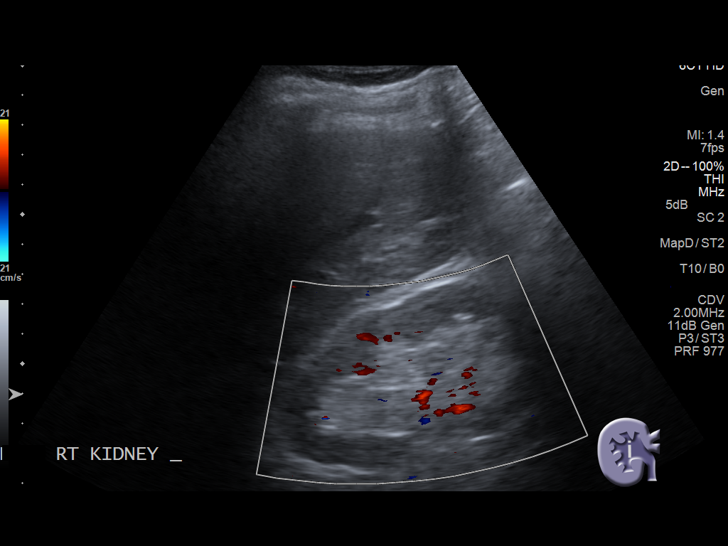
[im 11/64]
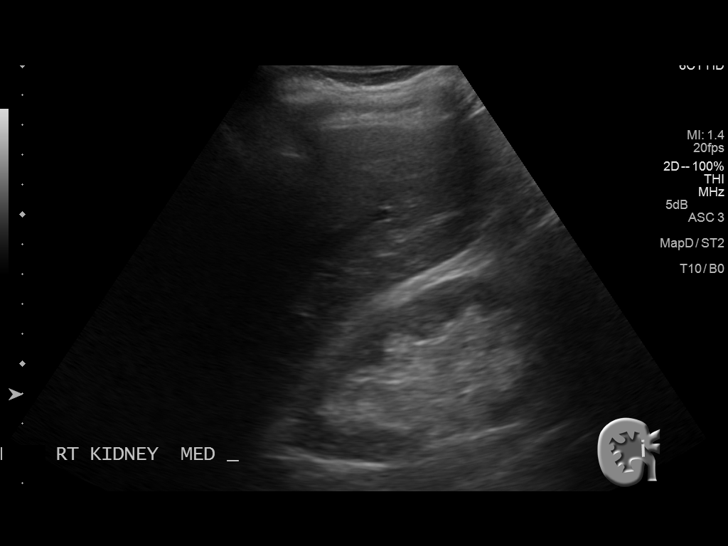
[im 16/64]
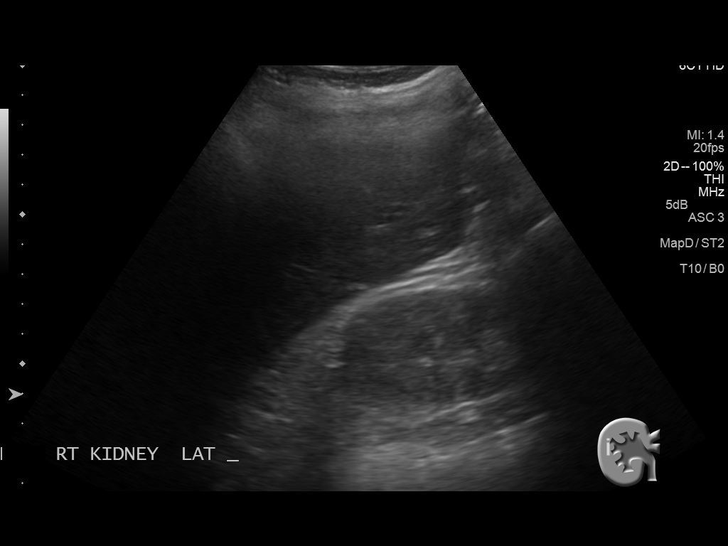
[im 22/64]
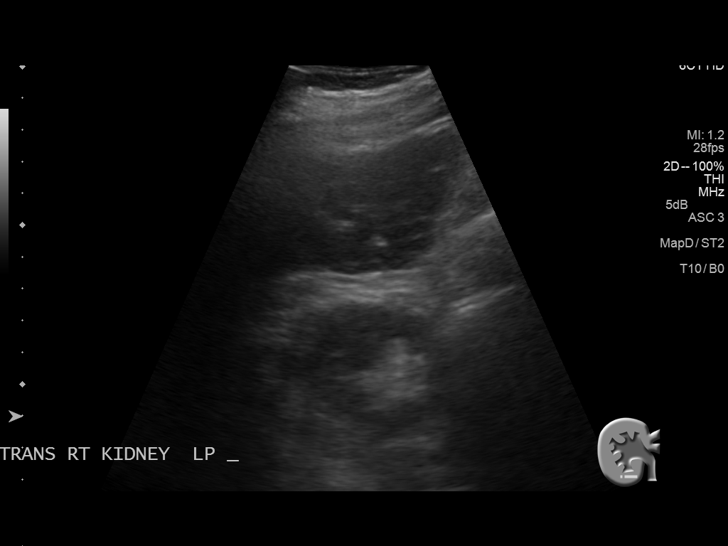
[im 24/64]
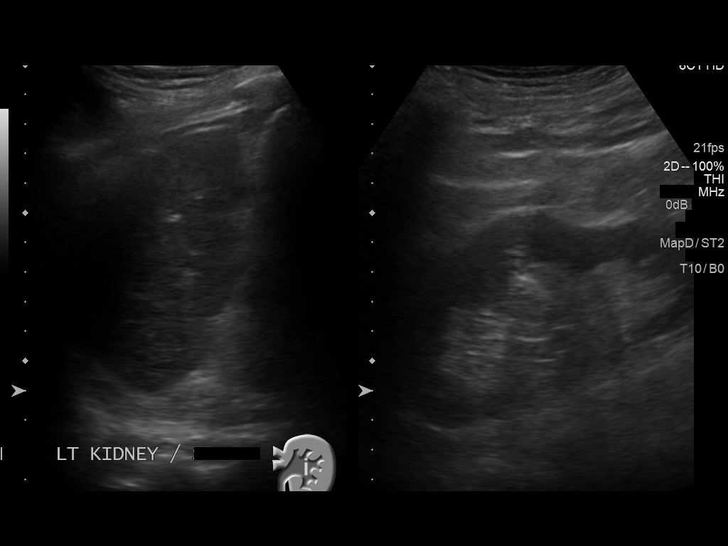
[im 29/64]
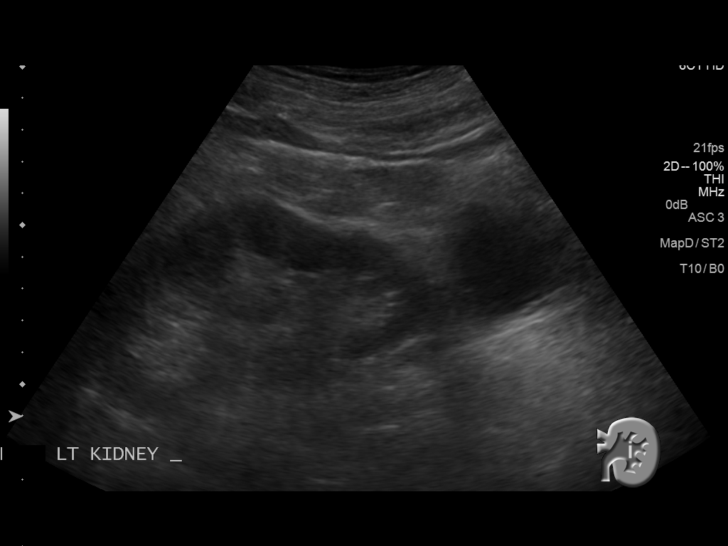
[im 35/64]
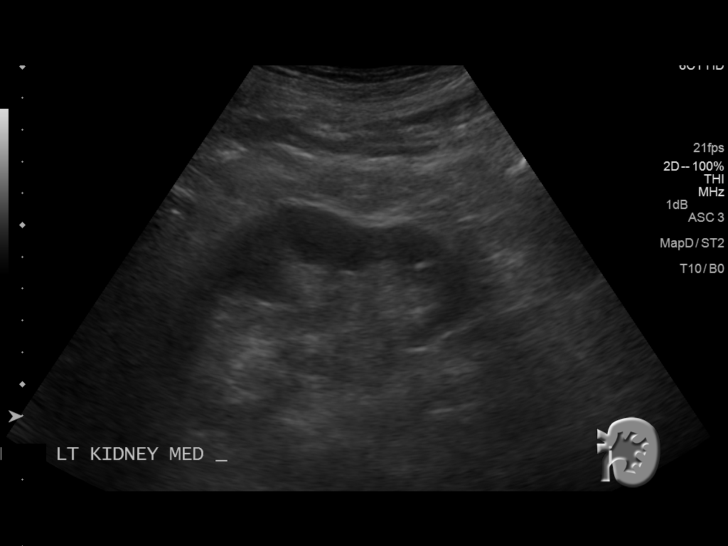
[im 40/64]
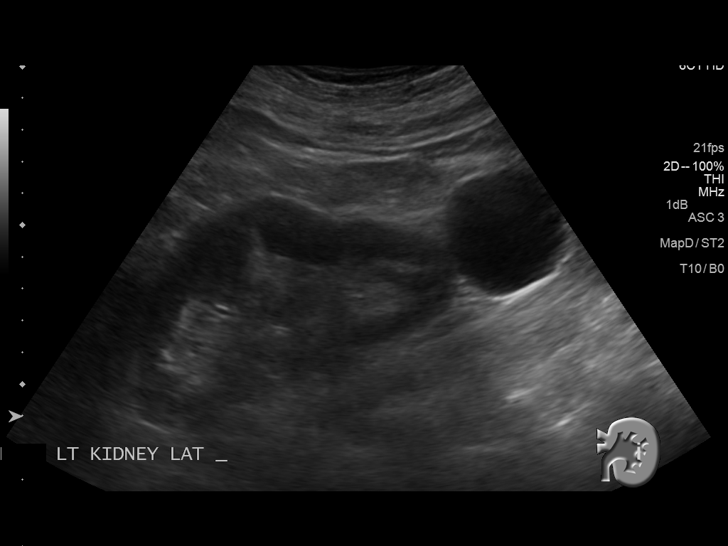
[im 43/64]
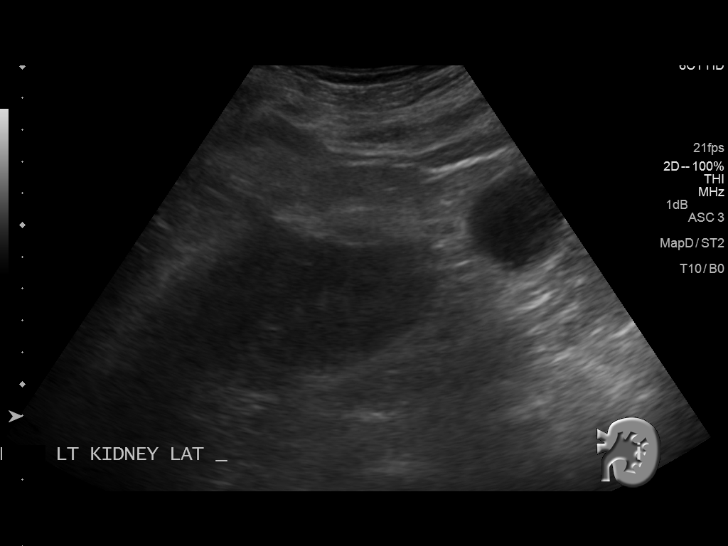
[im 48/64]
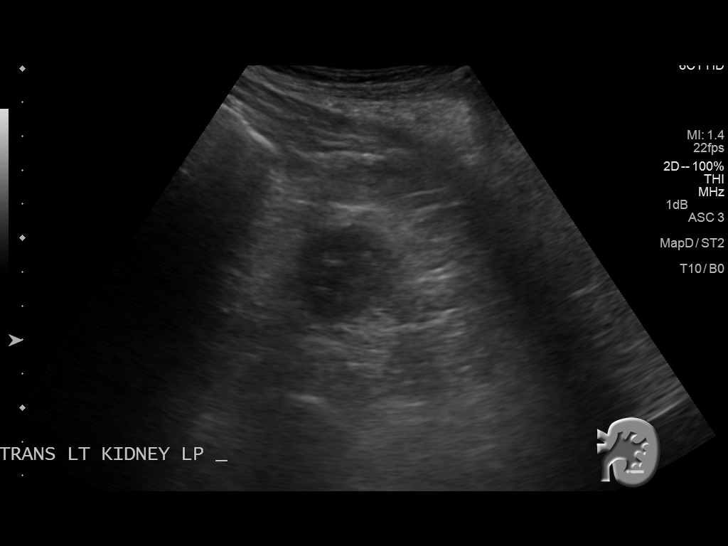
[im 53/64]
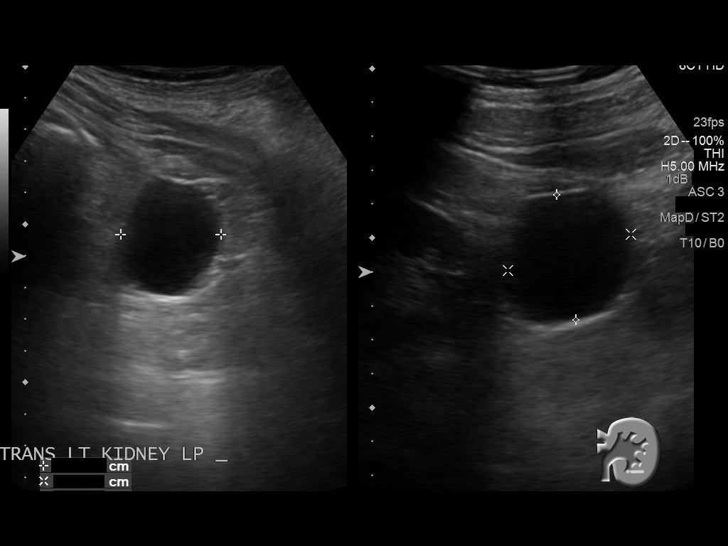
[im 58/64]
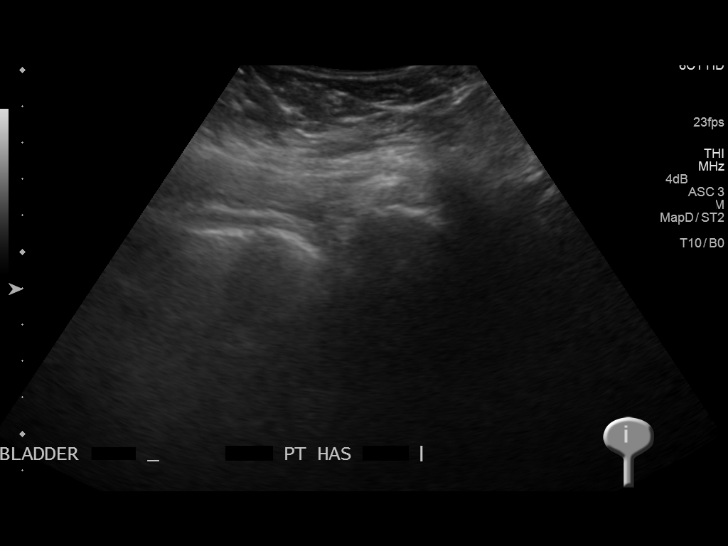
[im 64/64]
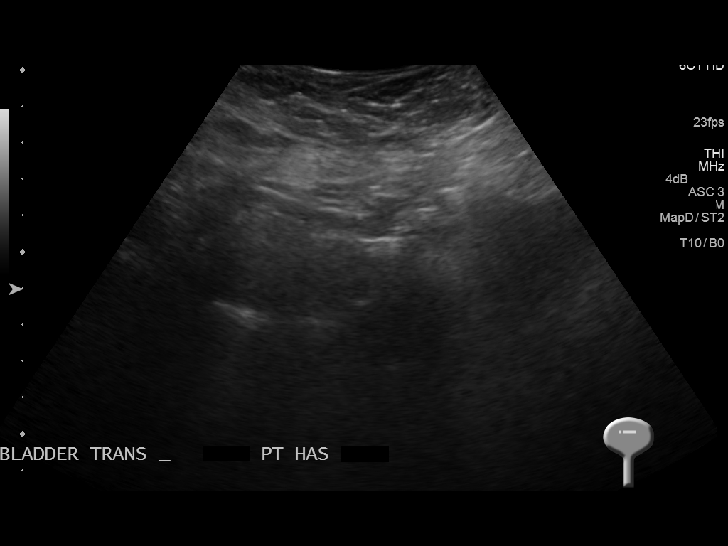

[14 of 25 positions shown; findings below may reference images not displayed]

FINDINGS: Right Kidney:

Length: 9.5 cm. Echogenicity within normal limits. Renal cortical
thickness is low normal. No mass, perinephric fluid, or
hydronephrosis visualized. No sonographically demonstrable calculus
or ureterectasis.

Left Kidney:

Length: 11.1 cm. Echogenicity and renal cortical thickness are
within normal limits. No perinephric fluid or hydronephrosis
visualized. There is a cyst arising from the lower pole of the left
kidney measuring 3.8 x 3.7 x 3.2 cm, documented previously. No other
renal mass. No sonographically demonstrable calculus or
ureterectasis.

Bladder:

Urinary bladder is decompressed with a catheter and therefore cannot
be assessed.
IMPRESSION: Simple cyst lower pole left kidney. Study otherwise within normal
limits.

## 2016-07-25 IMAGING — CR DG CHEST 1V PORT
1 series · 1 of 1 positions shown · non-contrast
Comparison: 07/02/2014

CLINICAL DATA: Hypoxia.  Found unresponsive and hypoglycemic.

EXAM:
PORTABLE CHEST - 1 VIEW

[portable]
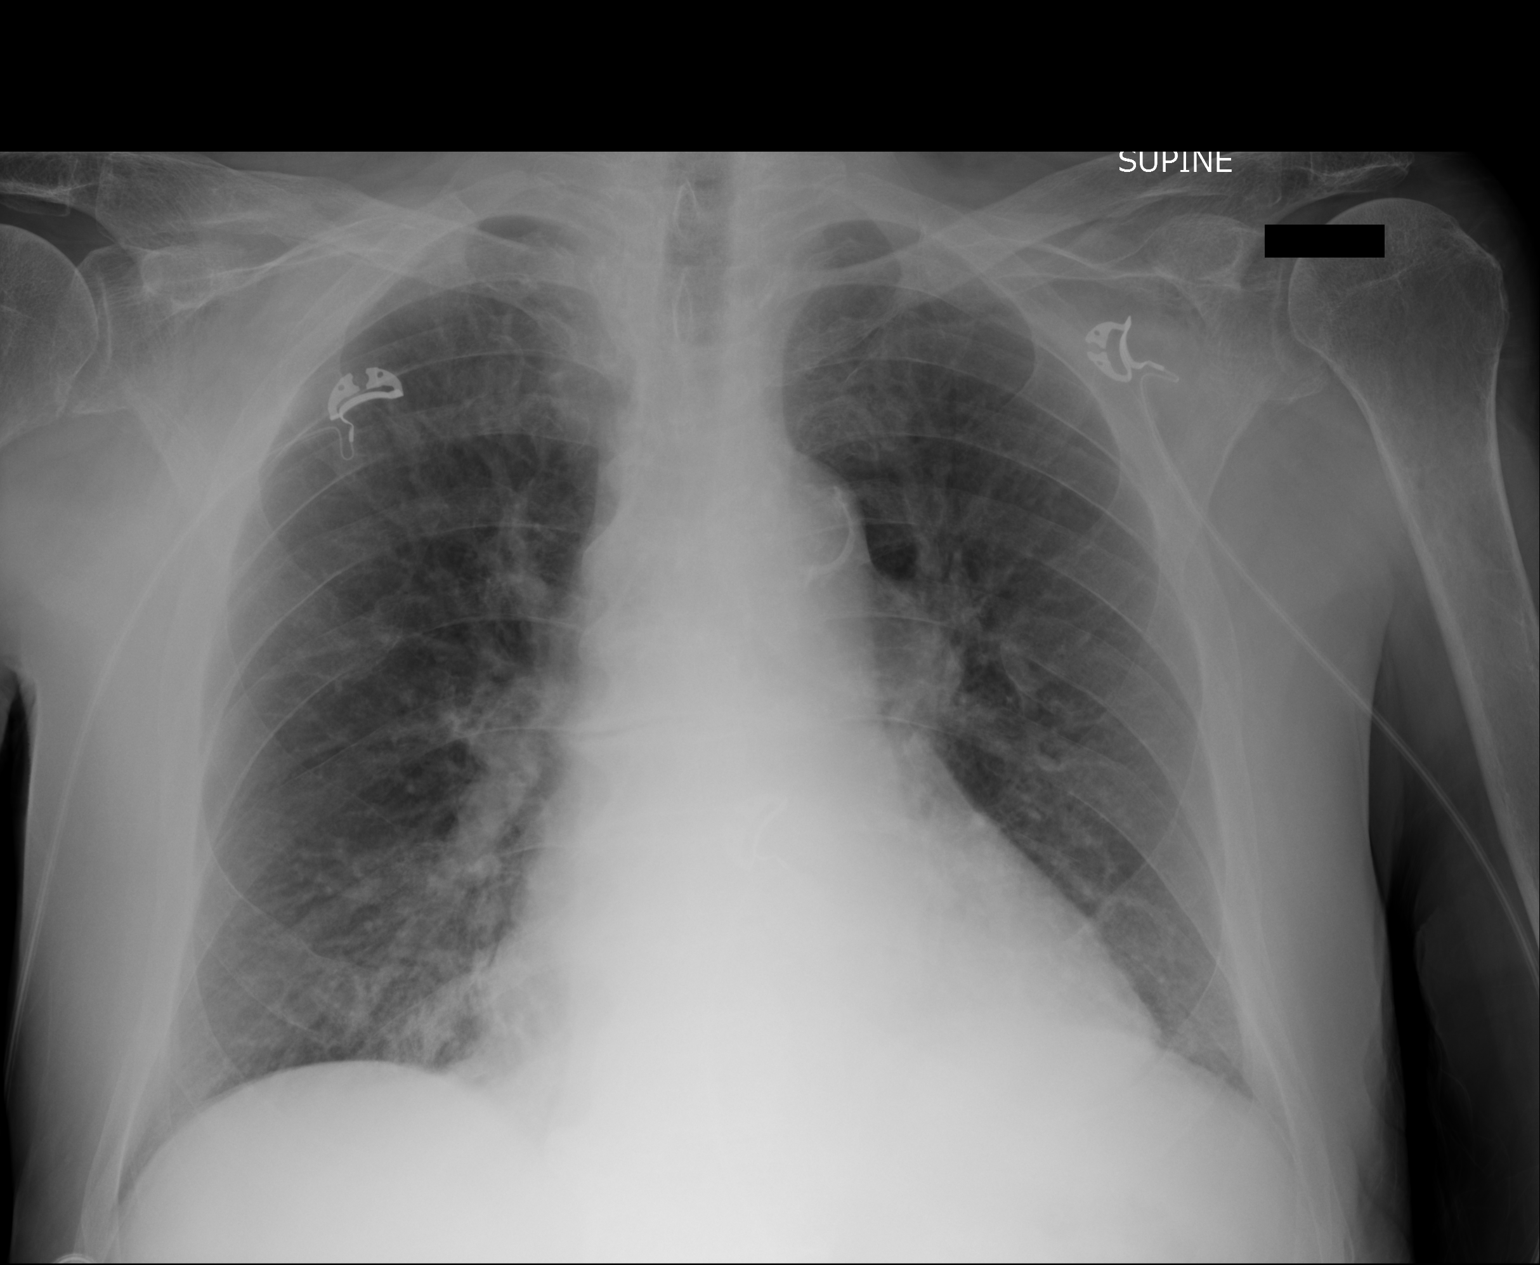

[1 of 1 positions shown; findings below may reference images not displayed]

FINDINGS: Chronic cardiomegaly. Stable and negative aortic and hilar contours.
There is a streaky right infrahilar lung opacity. No edema,
effusion, or pneumothorax.
IMPRESSION: Right infrahilar opacity could reflect atelectasis, pneumonia, or
aspiration.

## 2016-07-25 IMAGING — CT CT HEAD W/O CM
2 of 3 series · 16 of 30 positions shown, 18 images · non-contrast
Comparison: Prior CT from 12/15/2013

CLINICAL DATA: Initial evaluation for acute altered mental status,
unresponsive.

EXAM:
CT HEAD WITHOUT CONTRAST
TECHNIQUE: Contiguous axial images were obtained from the base of the skull
through the vertex without intravenous contrast.

[Series 2: head wo · axial · 0.41mm/px · z∈[+1246,+1366]mm · 8 of 32 slices shown, 10 images]
[im 4/32  brain]
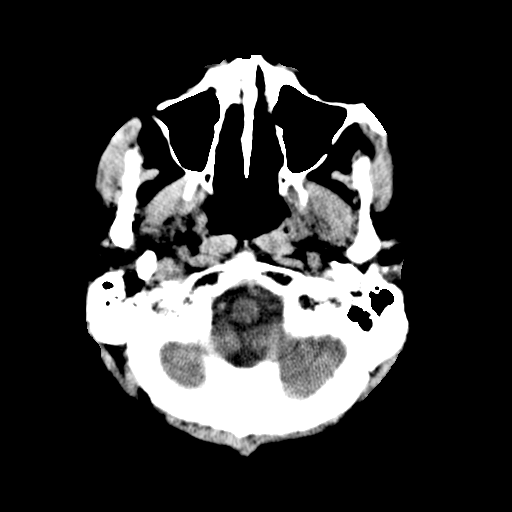
[im 4/32  bone]
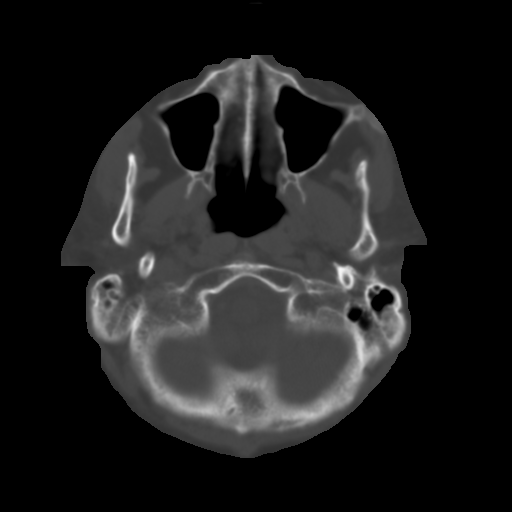
[im 7/32  brain]
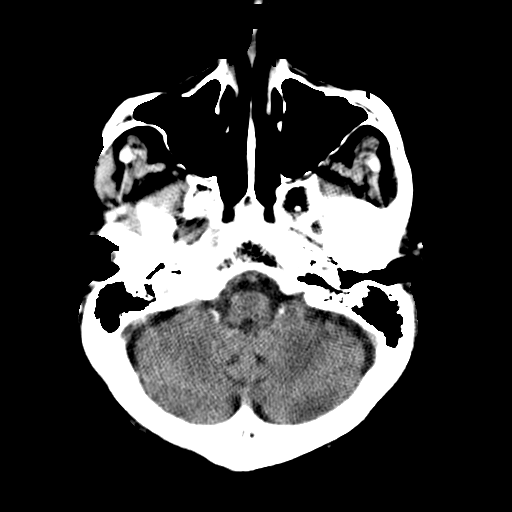
[im 11/32  brain]
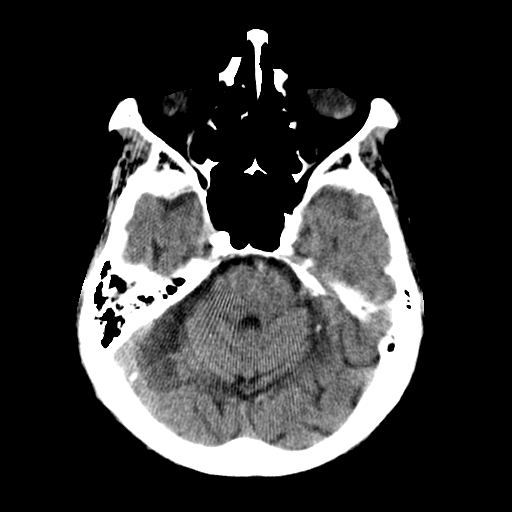
[im 14/32  brain]
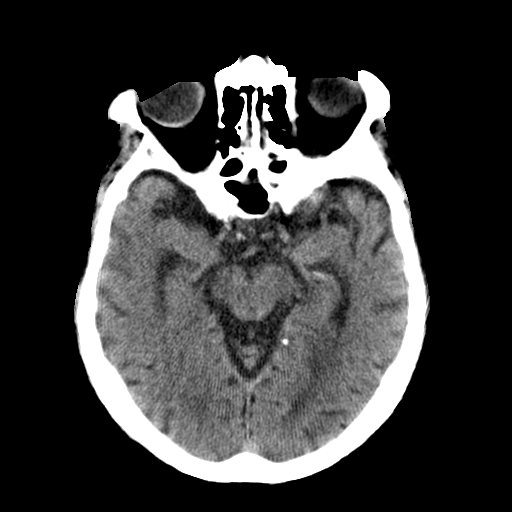
[im 18/32  brain]
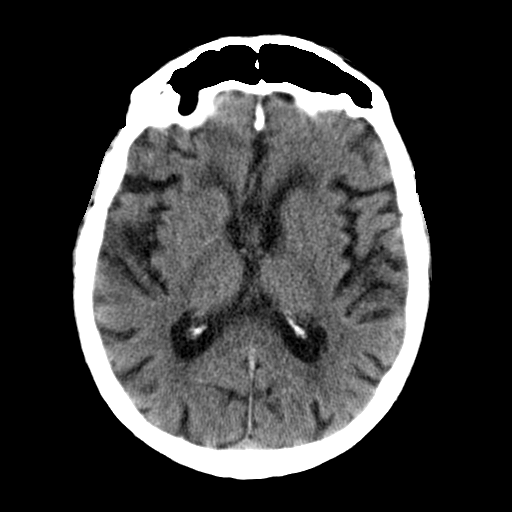
[im 18/32  bone]
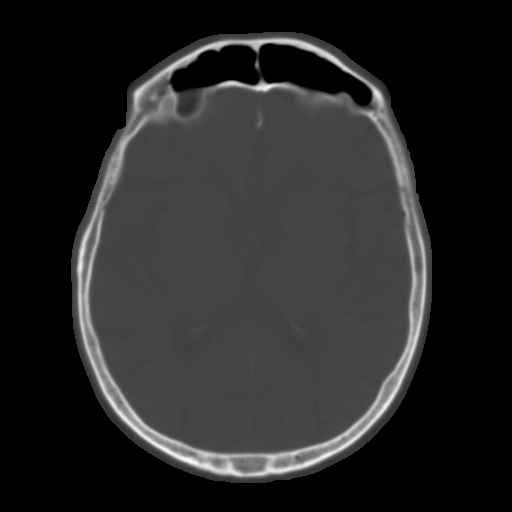
[im 21/32  brain]
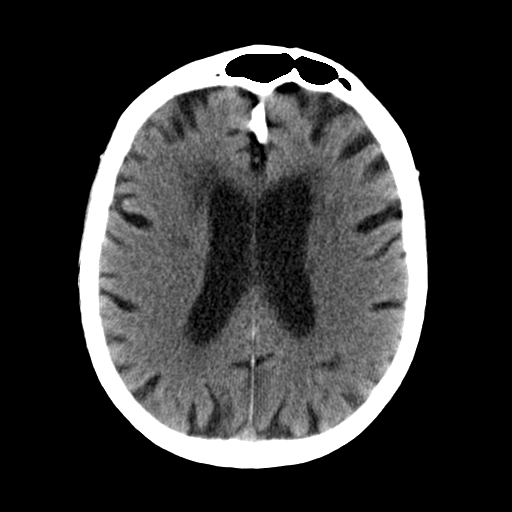
[im 25/32  brain]
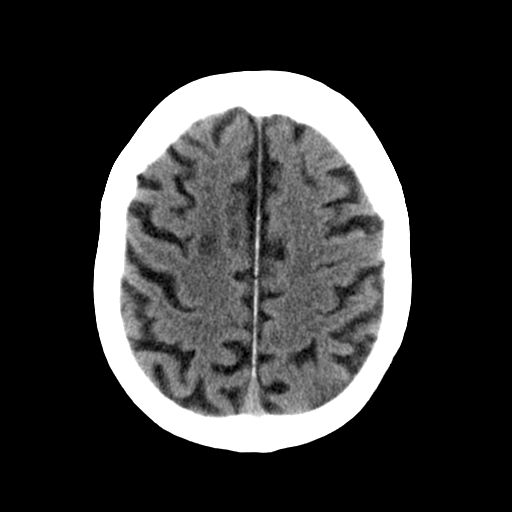
[im 28/32  brain]
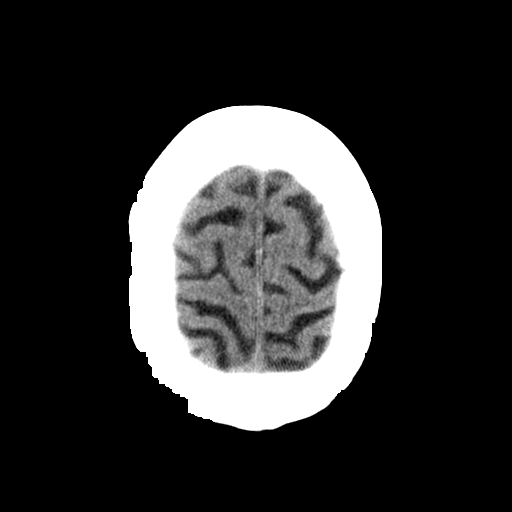

[Series 4: head wo recons · axial · 0.41mm/px · z∈[+1345,+1435]mm · 8 of 30 slices shown]
[im 4/30  brain]
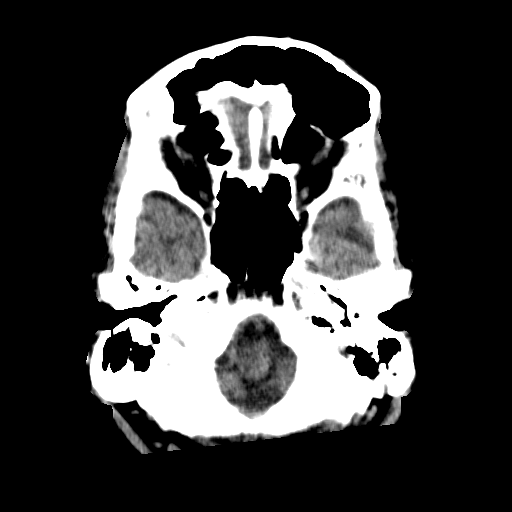
[im 7/30  brain]
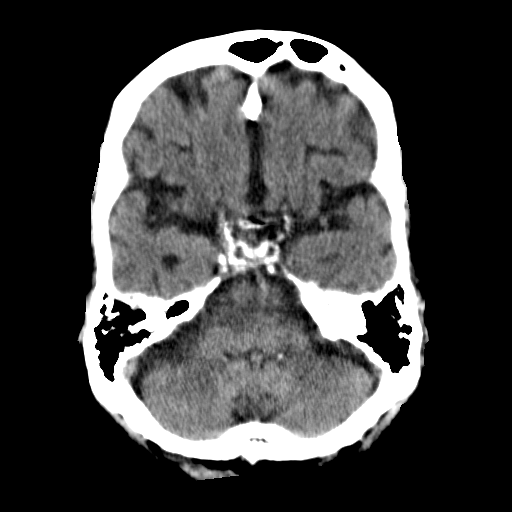
[im 10/30  brain]
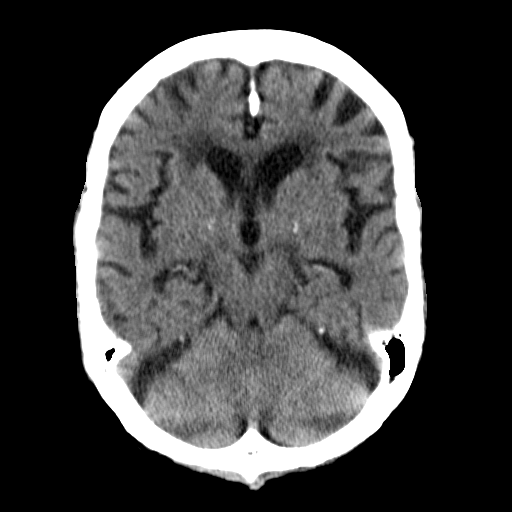
[im 13/30  brain]
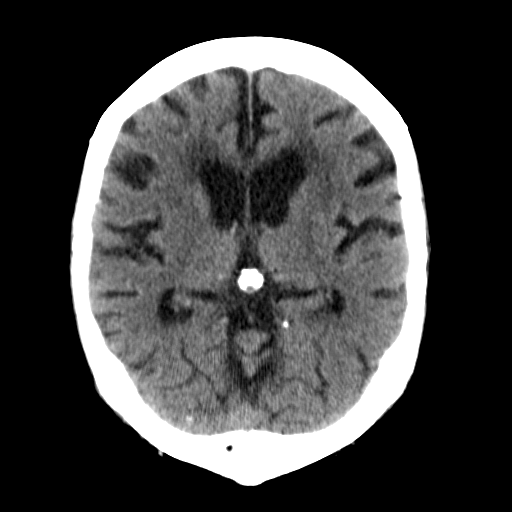
[im 17/30  brain]
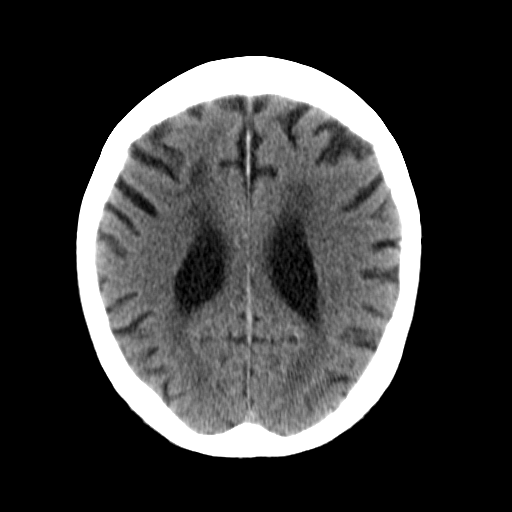
[im 20/30  brain]
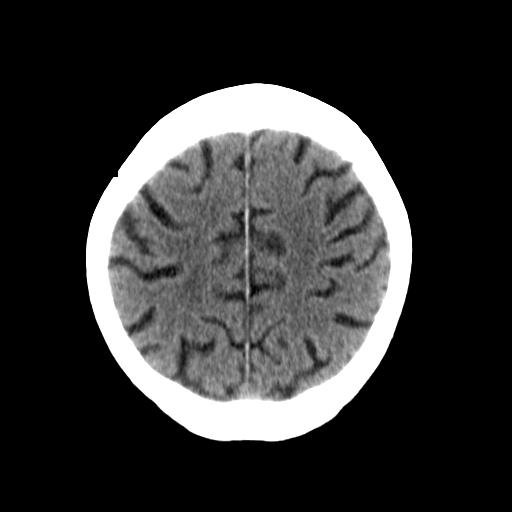
[im 23/30  brain]
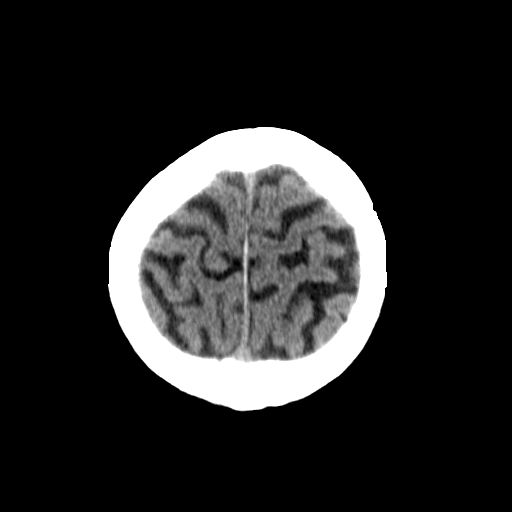
[im 26/30  brain]
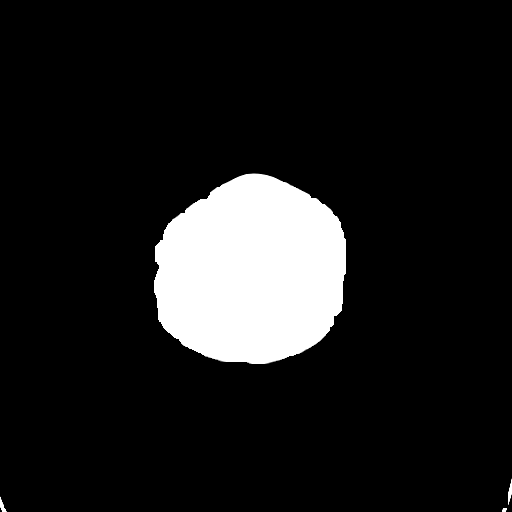

[16 of 30 positions shown; findings below may reference images not displayed]

FINDINGS: Study is degraded by motion artifact.

Diffuse prominence of the CSF containing spaces is compatible with
generalized cerebral atrophy. Patchy and confluent hypodensity
within the periventricular and deep white matter both cerebral
hemispheres most consistent with chronic small vessel ischemic
disease. Prominent vascular calcifications within the carotid
siphons.

No acute large vessel territory infarct identified. No acute
intracranial hemorrhage. No mass lesion, midline shift, or mass
effect. Ventricular prominence related to global parenchymal volume
loss present without hydrocephalus. No extra-axial fluid collection.

Scalp soft tissues within normal limits. No acute abnormality about
the orbits.

Calvarium intact.

Paranasal sinuses and mastoid air cells are well pneumatized and
free of fluid. Middle ear cavities are clear.
IMPRESSION: 1. No acute intracranial process identified.
2. Advanced age-related cerebral atrophy with chronic microvascular
ischemic disease.
